# Patient Record
Sex: Male | Born: 1945 | Race: White | Hispanic: No | Marital: Single | Smoking: Never smoker
Health system: Southern US, Community
[De-identification: ages and names within clinical notes are randomized; demographics above are authoritative.]

## PROBLEM LIST (undated history)

## (undated) DIAGNOSIS — N400 Enlarged prostate without lower urinary tract symptoms: Secondary | ICD-10-CM

## (undated) DIAGNOSIS — R972 Elevated prostate specific antigen [PSA]: Secondary | ICD-10-CM

## (undated) DIAGNOSIS — N401 Enlarged prostate with lower urinary tract symptoms: Secondary | ICD-10-CM

## (undated) DIAGNOSIS — G40209 Localization-related (focal) (partial) symptomatic epilepsy and epileptic syndromes with complex partial seizures, not intractable, without status epilepticus: Secondary | ICD-10-CM

## (undated) DIAGNOSIS — G4733 Obstructive sleep apnea (adult) (pediatric): Secondary | ICD-10-CM

## (undated) DIAGNOSIS — R339 Retention of urine, unspecified: Secondary | ICD-10-CM

## (undated) DIAGNOSIS — M810 Age-related osteoporosis without current pathological fracture: Secondary | ICD-10-CM

## (undated) DIAGNOSIS — N200 Calculus of kidney: Secondary | ICD-10-CM

## (undated) DIAGNOSIS — G8191 Hemiplegia, unspecified affecting right dominant side: Secondary | ICD-10-CM

## (undated) DIAGNOSIS — G8190 Hemiplegia, unspecified affecting unspecified side: Secondary | ICD-10-CM

## (undated) DIAGNOSIS — R319 Hematuria, unspecified: Secondary | ICD-10-CM

## (undated) DIAGNOSIS — I6529 Occlusion and stenosis of unspecified carotid artery: Secondary | ICD-10-CM

## (undated) DIAGNOSIS — N138 Other obstructive and reflux uropathy: Secondary | ICD-10-CM

## (undated) DIAGNOSIS — G4730 Sleep apnea, unspecified: Secondary | ICD-10-CM

## (undated) DIAGNOSIS — C801 Malignant (primary) neoplasm, unspecified: Secondary | ICD-10-CM

## (undated) DIAGNOSIS — I10 Essential (primary) hypertension: Secondary | ICD-10-CM

## (undated) DIAGNOSIS — N1330 Unspecified hydronephrosis: Secondary | ICD-10-CM

## (undated) DIAGNOSIS — G809 Cerebral palsy, unspecified: Secondary | ICD-10-CM

## (undated) DIAGNOSIS — R351 Nocturia: Secondary | ICD-10-CM

## (undated) DIAGNOSIS — R569 Unspecified convulsions: Secondary | ICD-10-CM

## (undated) DIAGNOSIS — Z87898 Personal history of other specified conditions: Secondary | ICD-10-CM

## (undated) HISTORY — PX: APPENDECTOMY: SHX54

## (undated) HISTORY — DX: Cerebral palsy, unspecified: G80.9

## (undated) HISTORY — PX: TONSILLECTOMY: SUR1361

## (undated) HISTORY — DX: Hemiplegia, unspecified affecting right dominant side: G81.91

## (undated) HISTORY — DX: Hematuria, unspecified: R31.9

## (undated) HISTORY — DX: Sleep apnea, unspecified: G47.30

## (undated) HISTORY — DX: Benign prostatic hyperplasia without lower urinary tract symptoms: N40.0

## (undated) HISTORY — DX: Unspecified convulsions: R56.9

## (undated) HISTORY — DX: Unspecified hydronephrosis: N13.30

## (undated) HISTORY — PX: OTHER SURGICAL HISTORY: SHX169

## (undated) HISTORY — DX: Retention of urine, unspecified: R33.9

## (undated) HISTORY — DX: Age-related osteoporosis without current pathological fracture: M81.0

## (undated) HISTORY — DX: Nocturia: R35.1

## (undated) HISTORY — DX: Benign prostatic hyperplasia with lower urinary tract symptoms: N40.1

## (undated) HISTORY — DX: Essential (primary) hypertension: I10

## (undated) HISTORY — DX: Localization-related (focal) (partial) symptomatic epilepsy and epileptic syndromes with complex partial seizures, not intractable, without status epilepticus: G40.209

## (undated) HISTORY — DX: Obstructive sleep apnea (adult) (pediatric): G47.33

## (undated) HISTORY — DX: Occlusion and stenosis of unspecified carotid artery: I65.29

## (undated) HISTORY — DX: Elevated prostate specific antigen (PSA): R97.20

## (undated) HISTORY — DX: Other obstructive and reflux uropathy: N13.8

## (undated) HISTORY — DX: Personal history of other specified conditions: Z87.898

## (undated) HISTORY — PX: LEG SURGERY: SHX1003

## (undated) HISTORY — DX: Hemiplegia, unspecified affecting unspecified side: G81.90

## (undated) HISTORY — DX: Calculus of kidney: N20.0

---
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
## 2008-03-13 DIAGNOSIS — R0600 Dyspnea, unspecified: Secondary | ICD-10-CM | POA: Insufficient documentation

## 2008-04-02 ENCOUNTER — Ambulatory Visit: Payer: Self-pay | Admitting: Family Medicine

## 2008-08-06 ENCOUNTER — Ambulatory Visit: Payer: Self-pay

## 2009-06-17 ENCOUNTER — Emergency Department: Payer: Self-pay | Admitting: Emergency Medicine

## 2011-09-13 DIAGNOSIS — G8190 Hemiplegia, unspecified affecting unspecified side: Secondary | ICD-10-CM

## 2011-09-13 DIAGNOSIS — G40209 Localization-related (focal) (partial) symptomatic epilepsy and epileptic syndromes with complex partial seizures, not intractable, without status epilepticus: Secondary | ICD-10-CM

## 2011-09-13 DIAGNOSIS — G8191 Hemiplegia, unspecified affecting right dominant side: Secondary | ICD-10-CM | POA: Insufficient documentation

## 2011-09-13 HISTORY — DX: Localization-related (focal) (partial) symptomatic epilepsy and epileptic syndromes with complex partial seizures, not intractable, without status epilepticus: G40.209

## 2011-09-13 HISTORY — DX: Hemiplegia, unspecified affecting right dominant side: G81.91

## 2011-09-13 HISTORY — DX: Hemiplegia, unspecified affecting unspecified side: G81.90

## 2012-09-09 ENCOUNTER — Emergency Department: Payer: Self-pay | Admitting: Emergency Medicine

## 2012-09-09 LAB — CBC
HCT: 42.6 % (ref 40.0–52.0)
HGB: 15.2 g/dL (ref 13.0–18.0)
MCH: 35 pg — ABNORMAL HIGH (ref 26.0–34.0)
MCHC: 35.6 g/dL (ref 32.0–36.0)
MCV: 98 fL (ref 80–100)
Platelet: 370 10*3/uL (ref 150–440)
RBC: 4.34 10*6/uL — ABNORMAL LOW (ref 4.40–5.90)
RDW: 12.3 % (ref 11.5–14.5)
WBC: 13.4 10*3/uL — ABNORMAL HIGH (ref 3.8–10.6)

## 2012-09-09 LAB — URINALYSIS, COMPLETE
Bacteria: NONE SEEN
Bilirubin,UR: NEGATIVE
Glucose,UR: NEGATIVE mg/dL (ref 0–75)
Ketone: NEGATIVE
Nitrite: NEGATIVE
Ph: 5 (ref 4.5–8.0)
Protein: 30
RBC,UR: 594 /HPF (ref 0–5)
Specific Gravity: 1.028 (ref 1.003–1.030)
Squamous Epithelial: NONE SEEN
WBC UR: 3 /HPF (ref 0–5)

## 2012-09-09 LAB — BASIC METABOLIC PANEL
Anion Gap: 7 (ref 7–16)
BUN: 19 mg/dL — ABNORMAL HIGH (ref 7–18)
Calcium, Total: 10.2 mg/dL — ABNORMAL HIGH (ref 8.5–10.1)
Chloride: 99 mmol/L (ref 98–107)
Co2: 30 mmol/L (ref 21–32)
Creatinine: 1.15 mg/dL (ref 0.60–1.30)
Glucose: 124 mg/dL — ABNORMAL HIGH (ref 65–99)
Osmolality: 276 (ref 275–301)
Potassium: 3.6 mmol/L (ref 3.5–5.1)
Sodium: 136 mmol/L (ref 136–145)

## 2012-09-13 ENCOUNTER — Emergency Department: Payer: Self-pay | Admitting: Emergency Medicine

## 2012-09-13 LAB — URINALYSIS, COMPLETE
Bilirubin,UR: NEGATIVE
Glucose,UR: NEGATIVE mg/dL (ref 0–75)
Ketone: NEGATIVE
Nitrite: NEGATIVE
Ph: 5 (ref 4.5–8.0)
Protein: 30
RBC,UR: 121 /HPF (ref 0–5)
Specific Gravity: 1.014 (ref 1.003–1.030)
Squamous Epithelial: NONE SEEN
WBC UR: 6 /HPF (ref 0–5)

## 2012-09-15 ENCOUNTER — Emergency Department: Payer: Self-pay | Admitting: Emergency Medicine

## 2012-09-15 LAB — URINALYSIS, COMPLETE
Bacteria: NONE SEEN
Bilirubin,UR: NEGATIVE
Glucose,UR: NEGATIVE mg/dL (ref 0–75)
Nitrite: NEGATIVE
Ph: 6 (ref 4.5–8.0)
Protein: 30
RBC,UR: 742 /HPF (ref 0–5)
Specific Gravity: 1.02 (ref 1.003–1.030)
Squamous Epithelial: <1
WBC UR: 14 /HPF (ref 0–5)

## 2012-09-16 ENCOUNTER — Ambulatory Visit: Payer: Self-pay | Admitting: Urology

## 2012-09-16 ENCOUNTER — Emergency Department: Payer: Self-pay | Admitting: Emergency Medicine

## 2012-09-16 LAB — URINALYSIS, COMPLETE
Bilirubin,UR: NEGATIVE
Glucose,UR: NEGATIVE mg/dL (ref 0–75)
Ketone: NEGATIVE
Nitrite: NEGATIVE
Ph: 7 (ref 4.5–8.0)
Protein: NEGATIVE
RBC,UR: 5 /HPF (ref 0–5)
Specific Gravity: 1.005 (ref 1.003–1.030)
Squamous Epithelial: NONE SEEN
WBC UR: 3 /HPF (ref 0–5)

## 2012-10-01 ENCOUNTER — Ambulatory Visit: Payer: Self-pay

## 2012-10-12 ENCOUNTER — Emergency Department: Payer: Self-pay | Admitting: Emergency Medicine

## 2012-11-01 ENCOUNTER — Emergency Department: Payer: Self-pay | Admitting: Emergency Medicine

## 2013-03-18 ENCOUNTER — Ambulatory Visit: Payer: Self-pay | Admitting: Urology

## 2013-10-15 ENCOUNTER — Ambulatory Visit: Payer: Self-pay | Admitting: Urology

## 2014-04-15 ENCOUNTER — Ambulatory Visit: Payer: Self-pay | Admitting: Gastroenterology

## 2014-09-25 DIAGNOSIS — Z5181 Encounter for therapeutic drug level monitoring: Secondary | ICD-10-CM | POA: Insufficient documentation

## 2014-10-03 NOTE — Consult Note (Signed)
PATIENT NAME:  Dylan Sullivan, Dylan Sullivan MR#:  [id number] DATE OF BIRTH:  1946-05-18  DATE OF CONSULTATION:  09/16/2012  REFERRING PHYSICIAN:   CONSULTING PHYSICIAN:  Juliann Mule. Valma Cava II, MD  REASON FOR CONSULTATION:  Hematuria and urinary retention.   HISTORY OF PRESENT ILLNESS:  The patient is a 69 year old gentleman with a known history of BPH and kidney stones who presented with 5 recent visits to the ER according to them for hematuria and Foley catheter issues now presents with the Foley catheter coming all the way out and presenting with the Foley catheter balloon intact, but blown up and significant blood from the urethra consistent prostatic bleeding. He denies any fevers, chills, sweats. He does not remember taking it out. There are some baseline memory issues. It appears on conversations with the patient that he is somewhat of a poor historian; however ultimately, he did have a Foley catheter placed and it was removed with the balloon intact. He is unsure how this happened. He does describe some urgency and leakage from the catheter prior to this happening as would be expected. Upon discussion with him, he describes no significant GU history. He denies any baseline lower urinary tract symptoms. Denies any prostate issues. Denies any prostate surgery.   REVIEW OF SYSTEMS: No fevers, chills, sweats, nausea, vomiting, weight loss, weight gain or change in bowel or urinary habits other than outlined above. The balance of 12 systems is otherwise negative.   PAST MEDICAL HISTORY:   1.  Urinary retention.  2.  Kidney stones.   PAST SURGICAL HISTORY:  No GU surgical history.   OUTPATIENT MEDICATIONS:   1.  Ibuprofen.  2.  Percocet.  3.  Zofran.  4.  Aspirin.  5.  Dilantin.  6.  Hydrochlorothiazide.  7.  Keppra.  8.  Calcium citrate.  9.  Red yeast rice supplement.  10.  Coenzyme Q10 supplement.  11.  Vitamin B12.  12.  Flaxseed oil.   PHYSICAL EXAMINATION: VITAL SIGNS: He is afebrile, pulse  85, respiratory rate 18, blood pressure 122/76, satting 97% on room air.  GENERAL: He is alert and oriented x 3, conversational,  HEENT: Normocephalic and atraumatic.  NECK: Supple without lymphadenopathy.  CHEST: Nonlabored.  CARDIOVASCULAR: 2+ upper and lower extremity pulses x 4.  ABDOMEN: Soft, nontender. He does have mild suprapubic distention consistent with a full bladder. GENITOURINARY: Normal penis and bilateral descended testes with blood clots and penile urethral bleeding consistent with Foley catheter trauma.  RECTAL: Deferred.  EXTREMITIES: No cyanosis, clubbing, or edema.  MUSCULOSKELETAL: 5 out of 5 strength.  PSYCHIATRIC: Appropriate.  NEUROLOGIC: Grossly intact.   IMAGING:  Recent renal ultrasound and KUB from the last week were reviewed. Small renal stone is nonobstructing bilaterally. Ultrasound was consistent with a large prostate.   ASSESSMENT:  This is a 69 year old male with a known history of urinary retention and Foley catheter trauma. I was able to place a 16 [place] coude catheter with a moderate degree of difficulty. I placed 15 mL of sterile water in the balloon which drained initially amber urine output that did have some terminal hematuria too consistent with a prostatic bleed. Ultimately, the patient drained quite well without issue. He did have some bleeding around the catheter as expected.   ASSESSMENT:   1.  Discharge home with Foley catheter.  2.  Outpatient followup with local urologist for further evaluation.    ____________________________ Juliann Mule Nobie Putnam, MD erh:si D: 09/16/2012 14:19:00 ET T: 09/16/2012 17:08:14 ET JOB#:  [id number]  cc: Juliann Mule. Nobie Putnam, MD, <Dictator> Carroll Sage MD ELECTRONICALLY SIGNED 10/10/2012 11:51

## 2014-10-03 NOTE — Consult Note (Signed)
PATIENT NAME:  Dylan Sullivan, MANNIS MR#:  [id number] DATE OF BIRTH:  05-Oct-1945  DATE OF CONSULTATION:  09/16/2012  REFERRING PHYSICIAN:   CONSULTING PHYSICIAN:  Juliann Mule. Valma Cava II, MD  REASON FOR CONSULTATION:  Hematuria and urinary retention.   HISTORY OF PRESENT ILLNESS:  The patient is a 69 year old gentleman with a known history of BPH and kidney stones who presented with 5 recent visits to the ER according to them for hematuria and Foley catheter issues now presents with the Foley catheter coming all the way out and presenting with the Foley catheter balloon intact, but blown up and significant blood from the urethra consistent prostatic bleeding. He denies any fevers, chills, sweats. He does not remember taking it out. There are some baseline memory issues. It appears on conversations with the patient that he is somewhat of a poor historian; however ultimately, he did have a Foley catheter placed and it was removed with the balloon intact. He is unsure how this happened. He does describe some urgency and leakage from the catheter prior to this happening as would be expected. Upon discussion with him, he describes no significant GU history. He denies any baseline lower urinary tract symptoms. Denies any prostate issues. Denies any prostate surgery.   REVIEW OF SYSTEMS: No fevers, chills, sweats, nausea, vomiting, weight loss, weight gain or change in bowel or urinary habits other than outlined above. The balance of 12 systems is otherwise negative.   PAST MEDICAL HISTORY:   1.  Urinary retention.  2.  Kidney stones.   PAST SURGICAL HISTORY:  No GU surgical history.   OUTPATIENT MEDICATIONS:   1.  Ibuprofen.  2.  Percocet.  3.  Zofran.  4.  Aspirin.  5.  Dilantin.  6.  Hydrochlorothiazide.  7.  Keppra.  8.  Calcium citrate.  9.  Red yeast rice supplement.  10.  Coenzyme Q10 supplement.  11.  Vitamin B12.  12.  Flaxseed oil.   PHYSICAL EXAMINATION: VITAL SIGNS: He is afebrile, pulse  85, respiratory rate 18, blood pressure 122/76, satting 97% on room air.  GENERAL: He is alert and oriented x 3, conversational,  HEENT: Normocephalic and atraumatic.  NECK: Supple without lymphadenopathy.  CHEST: Nonlabored.  CARDIOVASCULAR: 2+ upper and lower extremity pulses x 4.  ABDOMEN: Soft, nontender. He does have mild suprapubic distention consistent with a full bladder. GENITOURINARY: Normal penis and bilateral descended testes with blood clots and penile urethral bleeding consistent with Foley catheter trauma.  RECTAL: Deferred.  EXTREMITIES: No cyanosis, clubbing, or edema.  MUSCULOSKELETAL: 5 out of 5 strength.  PSYCHIATRIC: Appropriate.  NEUROLOGIC: Grossly intact.   IMAGING:  Recent renal ultrasound and KUB from the last week were reviewed. Small renal stone is nonobstructing bilaterally. Ultrasound was consistent with a large prostate.   ASSESSMENT:  This is a 69 year old male with a known history of urinary retention and Foley catheter trauma. I was able to place a 16 [place] coude catheter with a moderate degree of difficulty. I placed 15 mL of sterile water in the balloon which drained initially amber urine output that did have some terminal hematuria too consistent with a prostatic bleed. Ultimately, the patient drained quite well <<MISSING TEXT>>. He did have some bleeding around the catheter as expected.   ASSESSMENT:   1.  Discharge home with Foley catheter.  2.  Outpatient followup with local urologist for further evaluation.    ____________________________ Juliann Mule Nobie Putnam, MD erh:si D: 09/16/2012 14:19:00 ET T: 09/16/2012 17:08:14 ET JOB#:  [id number]  cc: Juliann Mule. Nobie Putnam, MD, <Dictator>

## 2014-10-03 NOTE — Consult Note (Signed)
PATIENT NAME:  Dylan Sullivan, Dylan Sullivan MR#:  [id number] DATE OF BIRTH:  05/01/1946  DATE OF CONSULTATION:  09/16/2012  REFERRING PHYSICIAN:   CONSULTING PHYSICIAN:  Dylan Mule. Valma Cava II, MD  REASON FOR CONSULTATION:  Hematuria and urinary retention.   HISTORY OF PRESENT ILLNESS:  The patient is a 69 year old gentleman with a known history of BPH and kidney stones who presented with 5 recent visits to the ER according to them for hematuria and Foley catheter issues now presents with the Foley catheter coming all the way out and presenting with the Foley catheter balloon intact, but blown up and significant blood from the urethra consistent prostatic bleeding. He denies any fevers, chills, sweats. He does not remember taking it out. There are some baseline memory issues. It appears on conversations with the patient that he is somewhat of a poor historian; however ultimately, he did have a Foley catheter placed and it was removed with the balloon intact. He is unsure how this happened. He does describe some urgency and leakage from the catheter prior to this happening as would be expected. Upon discussion with him, he describes no significant GU history. He denies any baseline lower urinary tract symptoms. Denies any prostate issues. Denies any prostate surgery.   REVIEW OF SYSTEMS: No fevers, chills, sweats, nausea, vomiting, weight loss, weight gain or change in bowel or urinary habits other than outlined above. The balance of 12 systems is otherwise negative.   PAST MEDICAL HISTORY:   1.  Urinary retention.  2.  Kidney stones.   PAST SURGICAL HISTORY:  No GU surgical history.   OUTPATIENT MEDICATIONS:   1.  Ibuprofen.  2.  Percocet.  3.  Zofran.  4.  Aspirin.  5.  Dilantin.  6.  Hydrochlorothiazide.  7.  Keppra.  8.  Calcium citrate.  9.  (Dictation Anomaly)<<MISSING TEXT>>supplement.  10.  Coenzyme Q10 supplement.  11.  Vitamin B12.  12.  Flaxseed oil.   PHYSICAL EXAMINATION: VITAL SIGNS: He  is afebrile, pulse 85, respiratory rate 18, blood pressure 122/76, satting 97% on room air.  GENERAL: He is alert and oriented x 3, conversational,  HEENT: Normocephalic and atraumatic.  NECK: Supple without lymphadenopathy.  CHEST: Nonlabored.  CARDIOVASCULAR: 2+ upper and lower extremity pulses x 4.  ABDOMEN: Soft, nontender. He does have mild suprapubic distention consistent with a full bladder. GENITOURINARY: Normal penis and bilateral descended testes with blood clots and penile urethral bleeding consistent with Foley catheter trauma.  RECTAL: Deferred.  EXTREMITIES: No cyanosis, clubbing, or edema.  MUSCULOSKELETAL: 5 out of 5 strength.  PSYCHIATRIC: Appropriate.  NEUROLOGIC: Grossly intact.   IMAGING:  Recent renal ultrasound and KUB from the last week were reviewed. Small renal stone is nonobstructing bilaterally. Ultrasound was consistent with a large prostate.   ASSESSMENT:  This is a 69 year old male with a known history of urinary retention and Foley catheter trauma. I was able to place a 16 [place] coude catheter with a moderate degree of difficulty. I placed 15 mL of sterile water in the balloon which drained initially amber urine output that did have some terminal hematuria too consistent with a prostatic bleed. Ultimately, the patient drained quite well (Dictation Anomaly)<<MISSING TEXT>>. He did have some bleeding around the catheter as expected.   ASSESSMENT:   1.  Discharge home with Foley catheter.  2.  Outpatient followup with local urologist for further evaluation.    ____________________________ Dylan Mule Nobie Putnam, MD erh:si D: 09/16/2012 14:19:00 ET T: 09/16/2012 17:08:14 ET JOB#:  [id number]  cc: Dylan Mule. Nobie Putnam, MD, <Dictator>

## 2014-10-06 LAB — SURGICAL PATHOLOGY

## 2014-12-29 DIAGNOSIS — G4733 Obstructive sleep apnea (adult) (pediatric): Secondary | ICD-10-CM

## 2014-12-29 HISTORY — DX: Obstructive sleep apnea (adult) (pediatric): G47.33

## 2015-03-10 ENCOUNTER — Other Ambulatory Visit: Payer: Self-pay | Admitting: Urology

## 2015-04-10 ENCOUNTER — Encounter: Payer: Self-pay | Admitting: *Deleted

## 2015-04-10 ENCOUNTER — Other Ambulatory Visit: Payer: Self-pay | Admitting: *Deleted

## 2015-04-16 DIAGNOSIS — I10 Essential (primary) hypertension: Secondary | ICD-10-CM

## 2015-04-16 HISTORY — DX: Essential (primary) hypertension: I10

## 2015-04-20 ENCOUNTER — Encounter: Payer: Self-pay | Admitting: Urology

## 2015-04-20 ENCOUNTER — Ambulatory Visit (INDEPENDENT_AMBULATORY_CARE_PROVIDER_SITE_OTHER): Payer: Medicare Other | Admitting: Urology

## 2015-04-20 VITALS — BP 105/66 | HR 91 | Ht 69.0 in | Wt 162.3 lb

## 2015-04-20 DIAGNOSIS — N401 Enlarged prostate with lower urinary tract symptoms: Secondary | ICD-10-CM | POA: Diagnosis not present

## 2015-04-20 DIAGNOSIS — Z87898 Personal history of other specified conditions: Secondary | ICD-10-CM | POA: Diagnosis not present

## 2015-04-20 DIAGNOSIS — N138 Other obstructive and reflux uropathy: Secondary | ICD-10-CM

## 2015-04-20 DIAGNOSIS — N400 Enlarged prostate without lower urinary tract symptoms: Secondary | ICD-10-CM | POA: Insufficient documentation

## 2015-04-20 HISTORY — DX: Personal history of other specified conditions: Z87.898

## 2015-04-20 HISTORY — DX: Other obstructive and reflux uropathy: N40.1

## 2015-04-20 HISTORY — DX: Benign prostatic hyperplasia with lower urinary tract symptoms: N13.8

## 2015-04-20 MED ORDER — FINASTERIDE 5 MG PO TABS: 5.0000 mg | ORAL_TABLET | Freq: Every day | ORAL | Status: DC

## 2015-04-20 MED ORDER — TAMSULOSIN HCL 0.4 MG PO CAPS: 0.4000 mg | ORAL_CAPSULE | Freq: Every day | ORAL | Status: DC

## 2015-04-20 NOTE — Progress Notes (Signed)
04/20/2015 12:04 PM   Dylan Sullivan 1945/08/10 [id number]  Referring provider: No referring provider defined for this encounter.  Chief Complaint  Patient presents with  . Benign Prostatic Hypertrophy    nocturia    HPI: Patient is a 69 year old white male with BPH and LUTS and a history of elevated PSA who presents today for 6 month follow-up.   BPH WITH LUTS His IPSS score today is 5, which is mild lower urinary tract symptomatology. He is delighted with his quality life due to his urinary symptoms.  He denies any dysuria, hematuria or suprapubic pain.  He currently taking tamsulosin 0.4 mg daily and finasteride 5 mg daily.  He also denies any recent fevers, chills, nausea or vomiting.   He does not have a family history of PCa.      IPSS      04/20/15 1100       International Prostate Symptom Score   How often have you had the sensation of not emptying your bladder? Not at All     How often have you had to urinate less than every two hours? Less than 1 in 5 times     How often have you found you stopped and started again several times when you urinated? Less than 1 in 5 times     How often have you found it difficult to postpone urination? Not at All     How often have you had a weak urinary stream? Less than 1 in 5 times     How often have you had to strain to start urination? Not at All     How many times did you typically get up at night to urinate? 2 Times     Total IPSS Score 5     Quality of Life due to urinary symptoms   If you were to spend the rest of your life with your urinary condition just the way it is now how would you feel about that? Delighted        Score:  1-7 Mild 8-19 Moderate 20-35 Severe   History of elevated PSA Patient's PSA reached 9.8 ng/mL in November 2015. This was in association with a urinary tract infection. When the urinary tract infection was treated in the PSA repeated, it returned to baseline.  PMH: Past Medical History    Diagnosis Date  . Sleep apnea   . Osteoporosis   . Seizures ([place])   . BPH (benign prostatic hyperplasia)   . Hydronephrosis   . Hematuria   . Kidney stone   . Urinary retention   . Nocturia   . Elevated PSA   . Cerebral palsy ([place])   . Obstructive apnea 12/29/2014  . Hemiplegia ([place]) 09/13/2011    Overview:  Upper extremity most affected   . 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 Tonsillectomy    . Arm surgery Right     child  . Leg surgery Right     child  . Appendectomy    . Brain surgery      Home Medications:    Medication List       This list is accurate as of: 04/20/15 12:04 PM.  Always use your most recent med list.               aspirin EC 81 MG tablet  Take by mouth.  BIOGAIA PROBIOTIC Liqd  Take by mouth daily at 8 pm.     Coenzyme Q-10 200 MG Caps  Take by mouth.     DILANTIN 100 MG ER capsule  Generic drug:  phenytoin  TAKE 3 CAPSULES (300 MG TOTAL) BY MOUTH ONCE DAILY.     finasteride 5 MG tablet  Commonly known as:  PROSCAR  Take 1 tablet (5 mg total) by mouth daily.     Flaxseed Misc  Use 15 mLs daily.     hydrochlorothiazide 25 MG tablet  Commonly known as:  HYDRODIURIL  TAKE 1 TABLET BY MOUTH EVERY DAY FOR FLUID     levETIRAcetam 1000 MG tablet  Commonly known as:  KEPPRA  Take by mouth.     ONE-A-DAY BONE STRENGTH PO  Take by mouth.     tamsulosin 0.4 MG Caps capsule  Commonly known as:  FLOMAX  Take 1 capsule (0.4 mg total) by mouth daily.        Allergies: No Known Allergies  Family History: Family History  Problem Relation Age of Onset  . Breast cancer Maternal Aunt   . Prostate cancer Neg Hx   . Bladder Cancer Neg Hx   . Kidney cancer Neg Hx     Social History:  reports that he has never smoked. He does not have any smokeless tobacco history on file. He reports that he  does not drink alcohol. His drug history is not on file.  ROS: UROLOGY Frequent Urination?: No Hard to postpone urination?: No Burning/pain with urination?: No Get up at night to urinate?: Yes Leakage of urine?: No Urine stream starts and stops?: No Trouble starting stream?: No Do you have to strain to urinate?: No Blood in urine?: No Urinary tract infection?: No Sexually transmitted disease?: No Injury to kidneys or bladder?: No Painful intercourse?: No Weak stream?: No Erection problems?: No Penile pain?: No  Gastrointestinal Nausea?: No Vomiting?: No Indigestion/heartburn?: No Diarrhea?: No Constipation?: No  Constitutional Fever: No Night sweats?: No Weight loss?: No Fatigue?: No  Skin Skin rash/lesions?: No Itching?: No  Eyes Blurred vision?: No Double vision?: No  Ears/Nose/Throat Sore throat?: No Sinus problems?: No  Hematologic/Lymphatic Swollen glands?: No Easy bruising?: No  Cardiovascular Leg swelling?: No Chest pain?: No  Respiratory Cough?: No Shortness of breath?: No  Endocrine Excessive thirst?: No  Musculoskeletal Back pain?: No Joint pain?: No  Neurological Headaches?: No Dizziness?: No  Psychologic Depression?: No Anxiety?: No  Physical Exam: BP 105/66 mmHg  Pulse 91  Ht 5\' 9"  (1.753 m)  Wt 162 lb 4.8 oz (73.619 kg)  BMI 23.96 kg/m2  GU: No CVA tenderness.  No bladder fullness or masses.  Patient with circumcised phallus.   Urethral meatus is patent.  No penile discharge. No penile lesions or rashes. Scrotum without lesions, cysts, rashes and/or edema.  Testicles are located scrotally bilaterally. No masses are appreciated in the testicles. Left and right epididymis are normal. Rectal: Patient with  normal sphincter tone. Anus and perineum without scarring or rashes. No rectal masses are appreciated. Prostate is approximately 45 grams, no nodules are appreciated. Seminal vesicles are normal.  Laboratory Data: Lab  Results  Component Value Date   WBC 13.4* 09/09/2012   HGB 15.2 09/09/2012   HCT 42.6 09/09/2012   MCV 98 09/09/2012   PLT 370 09/09/2012    Lab Results  Component Value Date   CREATININE 1.15 09/09/2012    PSA History:  3.8 ng/mL on 03/18/2013  2.8 ng/mL on  10/04/2013  3.9 ng/mL on 04/23/2014  2.8 ng/mL on 10/16/2014   Assessment & Plan:    1. BPH (benign prostatic hyperplasia) with LUTS:   IPSS score is 5/0.  Patient will continue the tamsulosin and finasteride. A refill for both of these medications is sent to his pharmacy. He will return in 6 months for I PSS score, exam and PSA.  - PSA  2. History of elevated PSA:   Patient's PSA reached 9.8 ng/mL in November 2015. This was in association with a urinary tract infection. When the urinary tract infection was treated in the PSA repeated, it returned to baseline.  We will continue to monitor. Patient will return in 6 months for PSA and exam.  Return in about 6 months (around 10/18/2015) for IPSS and exam.  Zara Council, [facility]  [facility] [address], [place], [place] [address] [phone]

## 2015-04-21 LAB — PSA: Prostate Specific Ag, Serum: 2.9 ng/mL (ref 0.0–4.0)

## 2015-05-12 ENCOUNTER — Telehealth: Payer: Self-pay | Admitting: Urology

## 2015-05-12 DIAGNOSIS — N400 Enlarged prostate without lower urinary tract symptoms: Secondary | ICD-10-CM

## 2015-05-12 MED ORDER — TAMSULOSIN HCL 0.4 MG PO CAPS: 0.4000 mg | ORAL_CAPSULE | Freq: Every day | ORAL | Status: DC

## 2015-05-12 MED ORDER — FINASTERIDE 5 MG PO TABS: 5.0000 mg | ORAL_TABLET | Freq: Every day | ORAL | Status: DC

## 2015-05-12 NOTE — Telephone Encounter (Signed)
Pt came in and brought new prescriptions in to let us know he changed pharmacies.  He is now with [facility] [phone]  His current rx are:  Finasteride 5 mg tablet Tamsulosin HCL 0.4 mg capsule

## 2015-05-12 NOTE — Telephone Encounter (Signed)
Refills sent

## 2015-10-12 ENCOUNTER — Other Ambulatory Visit: Payer: Self-pay

## 2015-10-12 DIAGNOSIS — N400 Enlarged prostate without lower urinary tract symptoms: Secondary | ICD-10-CM

## 2015-10-13 ENCOUNTER — Other Ambulatory Visit: Payer: Medicare Other

## 2015-10-13 DIAGNOSIS — N400 Enlarged prostate without lower urinary tract symptoms: Secondary | ICD-10-CM

## 2015-10-14 LAB — PSA: Prostate Specific Ag, Serum: 2.4 ng/mL (ref 0.0–4.0)

## 2015-10-20 ENCOUNTER — Encounter: Payer: Self-pay | Admitting: Urology

## 2015-10-20 ENCOUNTER — Ambulatory Visit (INDEPENDENT_AMBULATORY_CARE_PROVIDER_SITE_OTHER): Payer: Medicare Other | Admitting: Urology

## 2015-10-20 VITALS — BP 117/66 | HR 82 | Ht 72.0 in | Wt 165.9 lb

## 2015-10-20 DIAGNOSIS — N138 Other obstructive and reflux uropathy: Secondary | ICD-10-CM

## 2015-10-20 DIAGNOSIS — Z87898 Personal history of other specified conditions: Secondary | ICD-10-CM | POA: Diagnosis not present

## 2015-10-20 DIAGNOSIS — N401 Enlarged prostate with lower urinary tract symptoms: Secondary | ICD-10-CM | POA: Diagnosis not present

## 2015-10-20 NOTE — Progress Notes (Signed)
11:37 AM   Dylan Sullivan July 03, 1945 [id number]  Referring provider: Casilda Carls, MD [address]   [place], [place] [address]  Chief Complaint  Patient presents with  . Benign Prostatic Hypertrophy    6 month follow up    HPI: Patient is a 70 year old Caucasian male with BPH and LUTS and a history of elevated PSA who presents today for 6 month follow-up.   BPH WITH LUTS His IPSS score today is 8, which is moderate lower urinary tract symptomatology. He is pleased with his quality life due to his urinary symptoms.  His previous I PSS score was 5/0.   His symptoms are slightly worse than 6 months ago. He denies any dysuria, hematuria or suprapubic pain.  He currently taking tamsulosin 0.4 mg daily and finasteride 5 mg daily.  He also denies any recent fevers, chills, nausea or vomiting.   He does not have a family history of PCa.      IPSS      10/20/15 1100       International Prostate Symptom Score   How often have you had the sensation of not emptying your bladder? About half the time     How often have you had to urinate less than every two hours? Less than half the time     How often have you found you stopped and started again several times when you urinated? Less than 1 in 5 times     How often have you found it difficult to postpone urination? Not at All     How often have you had a weak urinary stream? Not at All     How often have you had to strain to start urination? Not at All     How many times did you typically get up at night to urinate? 2 Times     Total IPSS Score 8     Quality of Life due to urinary symptoms   If you were to spend the rest of your life with your urinary condition just the way it is now how would you feel about that? Pleased        Score:  1-7 Mild 8-19 Moderate 20-35 Severe   History of elevated PSA Patient's PSA reached 9.8 ng/mL in November 2015. This was in association with a urinary tract infection. When the urinary tract  infection was treated in the PSA repeated, it returned to baseline.  His most recent PSA was 2.4 ng/mL on 10/13/2015.    PMH: Past Medical History  Diagnosis Date  . Sleep apnea   . Osteoporosis   . Seizures ([place])   . BPH (benign prostatic hyperplasia)   . Hydronephrosis   . Hematuria   . Kidney stone   . Urinary retention   . Nocturia   . Elevated PSA   . Cerebral palsy ([place])   . Obstructive apnea 12/29/2014  . Hemiplegia ([place]) 09/13/2011    Overview:  Upper extremity most affected   . 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 Tonsillectomy    . Arm surgery Right     child  . Leg surgery Right     child  . Appendectomy    . Brain surgery      Home Medications:    Medication List       This list is accurate as of: 10/20/15  11:37 AM.  Always use your most recent med list.               aspirin EC 81 MG tablet  Take by mouth.     BIOGAIA PROBIOTIC Liqd  Take by mouth daily at 8 pm.     Coenzyme Q-10 200 MG Caps  Take by mouth.     DILANTIN 100 MG ER capsule  Generic drug:  phenytoin  TAKE 3 CAPSULES (300 MG TOTAL) BY MOUTH ONCE DAILY.     finasteride 5 MG tablet  Commonly known as:  PROSCAR  Take 1 tablet (5 mg total) by mouth daily.     Flaxseed Misc  Reported on 10/20/2015     Flaxseed Oil Oil     hydrochlorothiazide 25 MG tablet  Commonly known as:  HYDRODIURIL  TAKE 1 TABLET BY MOUTH EVERY DAY FOR FLUID     levETIRAcetam 1000 MG tablet  Commonly known as:  KEPPRA  Take by mouth.     levETIRAcetam 1000 MG tablet  Commonly known as:  KEPPRA     ONE-A-DAY BONE STRENGTH PO  Take by mouth.     OVER THE COUNTER MEDICATION  Tumeric 500     tamsulosin 0.4 MG Caps capsule  Commonly known as:  FLOMAX  Take 1 capsule (0.4 mg total) by mouth daily.     V-R MAGNESIUM CITRATE Soln  Generic drug:  magnesium citrate    Take by mouth. Reported on 10/20/2015     VITAMIN D-3 PO  Take by mouth.        Allergies: No Known Allergies  Family History: Family History  Problem Relation Age of Onset  . Breast cancer Maternal Aunt   . Prostate cancer Neg Hx   . Bladder Cancer Neg Hx   . Kidney cancer Neg Hx     Social History:  reports that he has never smoked. He does not have any smokeless tobacco history on file. He reports that he does not drink alcohol or use illicit drugs.  ROS: UROLOGY Frequent Urination?: No Hard to postpone urination?: No Burning/pain with urination?: No Get up at night to urinate?: No Leakage of urine?: No Urine stream starts and stops?: No Trouble starting stream?: No Do you have to strain to urinate?: No Blood in urine?: No Urinary tract infection?: No Sexually transmitted disease?: No Injury to kidneys or bladder?: No Painful intercourse?: No Weak stream?: No Erection problems?: No Penile pain?: No  Gastrointestinal Nausea?: No Vomiting?: No Indigestion/heartburn?: No Diarrhea?: No Constipation?: No  Constitutional Fever: No Night sweats?: No Weight loss?: No Fatigue?: No  Skin Skin rash/lesions?: No Itching?: No  Eyes Blurred vision?: No Double vision?: No  Ears/Nose/Throat Sore throat?: No Sinus problems?: No  Hematologic/Lymphatic Swollen glands?: No Easy bruising?: No  Cardiovascular Leg swelling?: No Chest pain?: No  Respiratory Cough?: No Shortness of breath?: No  Endocrine Excessive thirst?: No  Musculoskeletal Back pain?: No Joint pain?: No  Neurological Headaches?: No Dizziness?: No  Psychologic Depression?: No Anxiety?: No  Physical Exam: BP 117/66 mmHg  Pulse 82  Ht 6' (1.829 m)  Wt 165 lb 14.4 oz (75.252 kg)  BMI 22.50 kg/m2  GU: No CVA tenderness.  No bladder fullness or masses.  Patient with circumcised phallus.   Urethral meatus is patent.  No penile discharge. No penile lesions or rashes. Scrotum  without lesions, cysts, rashes and/or edema.  Testicles are located scrotally bilaterally. No masses are appreciated in the testicles. Left and right  epididymis are normal. Rectal: Patient with  normal sphincter tone. Anus and perineum without scarring or rashes. No rectal masses are appreciated. Prostate is approximately 45 grams, no nodules are appreciated. Seminal vesicles are normal.  Laboratory Data: Lab Results  Component Value Date   WBC 13.4* 09/09/2012   HGB 15.2 09/09/2012   HCT 42.6 09/09/2012   MCV 98 09/09/2012   PLT 370 09/09/2012    Lab Results  Component Value Date   CREATININE 1.15 09/09/2012    PSA History:  3.8 ng/mL on 03/18/2013  2.8 ng/mL on 10/04/2013  3.9 ng/mL on 04/23/2014  2.8 ng/mL on 10/16/2014  2.9 ng/mL on 04/20/2015  2.4 ng/mL on 10/13/2015    Assessment & Plan:    1. BPH (benign prostatic hyperplasia) with LUTS:   IPSS score is 8/1.  Patient will continue the tamsulosin and finasteride. A refill for both of these medications is sent to his pharmacy. He will return in 6 months for I PSS score, exam and PSA.  2. History of elevated PSA:   Patient's PSA reached 9.8 ng/mL in November 2015. This was in association with a urinary tract infection. When the urinary tract infection was treated in the PSA repeated, it returned to baseline.  His most recent PSA was 2.4 ng/mL on 10/13/2015.  We will continue to monitor. Patient will return in 6 months for PSA and exam.  Return in about 6 months (around 04/21/2016) for IPSS and exam.  Zara Council, [facility]  [facility] [address] [place], [place] [address] [phone]

## 2016-01-25 DIAGNOSIS — I351 Nonrheumatic aortic (valve) insufficiency: Secondary | ICD-10-CM | POA: Insufficient documentation

## 2016-02-19 ENCOUNTER — Other Ambulatory Visit: Payer: Self-pay | Admitting: Urology

## 2016-02-19 DIAGNOSIS — N400 Enlarged prostate without lower urinary tract symptoms: Secondary | ICD-10-CM

## 2016-03-25 ENCOUNTER — Other Ambulatory Visit: Payer: Self-pay | Admitting: Urology

## 2016-03-25 DIAGNOSIS — N400 Enlarged prostate without lower urinary tract symptoms: Secondary | ICD-10-CM

## 2016-03-25 NOTE — Telephone Encounter (Signed)
Pharmacy sent a refill of finasteride. 30 days and no refills given. Pt has an appt on 04/21/16.

## 2016-04-11 ENCOUNTER — Other Ambulatory Visit: Payer: Self-pay

## 2016-04-11 DIAGNOSIS — N401 Enlarged prostate with lower urinary tract symptoms: Secondary | ICD-10-CM

## 2016-04-14 ENCOUNTER — Other Ambulatory Visit: Payer: Medicare Other

## 2016-04-14 DIAGNOSIS — N401 Enlarged prostate with lower urinary tract symptoms: Secondary | ICD-10-CM

## 2016-04-15 ENCOUNTER — Telehealth: Payer: Self-pay

## 2016-04-15 LAB — PSA: Prostate Specific Ag, Serum: 3.2 ng/mL (ref 0.0–4.0)

## 2016-04-21 ENCOUNTER — Encounter: Payer: Self-pay | Admitting: Urology

## 2016-04-21 ENCOUNTER — Ambulatory Visit (INDEPENDENT_AMBULATORY_CARE_PROVIDER_SITE_OTHER): Payer: Medicare Other | Admitting: Urology

## 2016-04-21 VITALS — BP 119/71 | HR 84 | Ht 72.0 in | Wt 166.8 lb

## 2016-04-21 DIAGNOSIS — Z87898 Personal history of other specified conditions: Secondary | ICD-10-CM

## 2016-04-21 DIAGNOSIS — N138 Other obstructive and reflux uropathy: Secondary | ICD-10-CM | POA: Diagnosis not present

## 2016-04-21 DIAGNOSIS — N401 Enlarged prostate with lower urinary tract symptoms: Secondary | ICD-10-CM | POA: Diagnosis not present

## 2016-04-21 NOTE — Progress Notes (Signed)
11:39 AM   Dylan Sullivan 08-Oct-1945 [id number]  Referring provider: Casilda Carls [address] [place],  [address]  Chief Complaint  Patient presents with  . Benign Prostatic Hypertrophy    6 month follow up  . Elevated PSA    HPI: Patient is a 70 year old Caucasian male with BPH and LUTS and a history of elevated PSA who presents today for 6 month follow-up.   BPH WITH LUTS His IPSS score today is 4, which is mild lower urinary tract symptomatology. He is mostly satisfied with his quality life due to his urinary symptoms.  His previous I PSS score was 8/2.   He complains of nocturia x  2.  He denies any dysuria, hematuria or suprapubic pain.  He currently taking tamsulosin 0.4 mg daily and finasteride 5 mg daily.  He also denies any recent fevers, chills, nausea or vomiting.   He does not have a family history of PCa.      IPSS    Row Name 04/21/16 1100         International Prostate Symptom Score   How often have you had the sensation of not emptying your bladder? Not at All     How often have you had to urinate less than every two hours? Less than 1 in 5 times     How often have you found you stopped and started again several times when you urinated? Not at All     How often have you found it difficult to postpone urination? Not at All     How often have you had a weak urinary stream? Less than 1 in 5 times     How often have you had to strain to start urination? Not at All     How many times did you typically get up at night to urinate? 2 Times     Total IPSS Score 4       Quality of Life due to urinary symptoms   If you were to spend the rest of your life with your urinary condition just the way it is now how would you feel about that? Mostly Satisfied        Score:  1-7 Mild 8-19 Moderate 20-35 Severe   History of elevated PSA Patient's PSA reached 9.8 ng/mL in November 2015. This was in association with a urinary tract infection. When the urinary  tract infection was treated in the PSA repeated, it returned to baseline.  His most recent PSA was 3.2 ng/mL on 04/14/2016.  PMH: Past Medical History:  Diagnosis Date  . BPH (benign prostatic hyperplasia)   . Cerebral palsy ([place])   . Elevated PSA   . Hematuria   . Hemiplegia ([place]) 09/13/2011   Overview:  Upper extremity most affected   . Hydronephrosis   . Kidney stone   . Nocturia   . Obstructive apnea 12/29/2014  . Osteoporosis   . 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 Seizures ([place])   . Sleep apnea   . Urinary retention     Surgical History: Past Surgical History:  Procedure Laterality Date  . APPENDECTOMY    . arm surgery Right    child  . BRAIN SURGERY    . LEG SURGERY Right    child  . TONSILLECTOMY      Home Medications:    Medication List       Accurate as of 04/21/16 11:39 AM.  Always use your most recent med list.          aspirin EC 81 MG tablet Take by mouth.   BIOGAIA PROBIOTIC Liqd Take by mouth daily at 8 pm.   Coenzyme Q-10 200 MG Caps Take by mouth.   DILANTIN 100 MG ER capsule Generic drug:  phenytoin TAKE 3 CAPSULES (300 MG TOTAL) BY MOUTH ONCE DAILY.   finasteride 5 MG tablet Commonly known as:  PROSCAR Take 5 mg by mouth daily.   Flaxseed Misc Reported on 10/20/2015   Flaxseed Oil Oil   hydrochlorothiazide 25 MG tablet Commonly known as:  HYDRODIURIL TAKE 1 TABLET BY MOUTH EVERY DAY FOR FLUID   levETIRAcetam 1000 MG tablet Commonly known as:  KEPPRA Take by mouth.   levETIRAcetam 1000 MG tablet Commonly known as:  KEPPRA   ondansetron 4 MG tablet Commonly known as:  ZOFRAN Take by mouth.   ONE-A-DAY BONE STRENGTH PO Take by mouth.   OVER THE COUNTER MEDICATION Tumeric 500   RESVERATROL PO Take by mouth.   tamsulosin 0.4 MG Caps capsule Commonly known as:  FLOMAX TAKE ONE CAPSULE BY MOUTH EVERY DAY   V-R MAGNESIUM CITRATE Soln Generic drug:   magnesium citrate Take by mouth. Reported on 10/20/2015   VITAMIN D-3 PO Take by mouth.       Allergies: No Known Allergies  Family History: Family History  Problem Relation Age of Onset  . Breast cancer Maternal Aunt   . Prostate cancer Neg Hx   . Bladder Cancer Neg Hx   . Kidney cancer Neg Hx     Social History:  reports that he has never smoked. He has never used smokeless tobacco. He reports that he does not drink alcohol or use drugs.  ROS: UROLOGY Frequent Urination?: No Hard to postpone urination?: No Burning/pain with urination?: No Get up at night to urinate?: No Leakage of urine?: No Urine stream starts and stops?: No Trouble starting stream?: No Do you have to strain to urinate?: No Blood in urine?: No Urinary tract infection?: No Sexually transmitted disease?: No Injury to kidneys or bladder?: No Painful intercourse?: No Weak stream?: No Erection problems?: No Penile pain?: No  Gastrointestinal Nausea?: No Vomiting?: No Indigestion/heartburn?: No Diarrhea?: No Constipation?: No  Constitutional Fever: No Night sweats?: No Weight loss?: No Fatigue?: No  Skin Skin rash/lesions?: No Itching?: No  Eyes Blurred vision?: No Double vision?: No  Ears/Nose/Throat Sore throat?: No Sinus problems?: No  Hematologic/Lymphatic Swollen glands?: No Easy bruising?: No  Cardiovascular Leg swelling?: No Chest pain?: No  Respiratory Cough?: No Shortness of breath?: No  Endocrine Excessive thirst?: No  Musculoskeletal Back pain?: No Joint pain?: No  Neurological Headaches?: No Dizziness?: No  Psychologic Depression?: No Anxiety?: No  Physical Exam: BP 119/71   Pulse 84   Ht 6' (1.829 m)   Wt 166 lb 12.8 oz (75.7 kg)   BMI 22.62 kg/m   GU: No CVA tenderness.  No bladder fullness or masses.  Patient with circumcised phallus.   Urethral meatus is patent.  No penile discharge. No penile lesions or rashes. Scrotum without lesions,  cysts, rashes and/or edema.  Testicles are located scrotally bilaterally. No masses are appreciated in the testicles. Left and right epididymis are normal. Rectal: Patient with  normal sphincter tone. Anus and perineum without scarring or rashes. No rectal masses are appreciated. Prostate is approximately 45 grams, no nodules are appreciated. Seminal vesicles are normal.  Laboratory Data: Lab Results  Component Value  Date   WBC 13.4 (H) 09/09/2012   HGB 15.2 09/09/2012   HCT 42.6 09/09/2012   MCV 98 09/09/2012   PLT 370 09/09/2012    Lab Results  Component Value Date   CREATININE 1.15 09/09/2012    PSA History:  3.8 ng/mL on 03/18/2013  2.8 ng/mL on 10/04/2013  3.9 ng/mL on 04/23/2014  2.8 ng/mL on 10/16/2014  2.9 ng/mL on 04/20/2015  2.4 ng/mL on 10/13/2015  3.2 ng/mL on 04/14/2016    Assessment & Plan:    1. BPH with LUTS  - IPSS score is 4/2, it is improving  - Continue conservative management, avoiding bladder irritants and timed voiding's  - Continue tamsulosin 0.4 mg daily and finasteride 5 mg daily  - RTC in 6 months for IPSS, PSA and exam   2. History of elevated PSA:   Patient's PSA reached 9.8 ng/mL in November 2015. This was in association with a urinary tract infection. When the urinary tract infection was treated in the PSA repeated, it returned to baseline.  His most recent PSA was 3.2 ng/mL on 04/14/2016.  We will continue to monitor. Patient will return in 6 months for PSA and exam.  Return in about 6 months (around 10/19/2016) for IPSS, PSA and exam.  Zara Council, [facility]  [facility] [address], [place], [place] [address] [phone]

## 2016-04-21 NOTE — Telephone Encounter (Signed)
Open in error

## 2016-04-29 ENCOUNTER — Other Ambulatory Visit: Payer: Self-pay

## 2016-04-29 ENCOUNTER — Other Ambulatory Visit: Payer: Self-pay | Admitting: Urology

## 2016-04-29 DIAGNOSIS — N401 Enlarged prostate with lower urinary tract symptoms: Secondary | ICD-10-CM

## 2016-04-29 MED ORDER — FINASTERIDE 5 MG PO TABS: 5.0000 mg | ORAL_TABLET | Freq: Every day | ORAL | 12 refills | Status: DC

## 2016-06-16 ENCOUNTER — Observation Stay: Payer: Medicare Other

## 2016-06-16 ENCOUNTER — Encounter: Payer: Self-pay | Admitting: *Deleted

## 2016-06-16 ENCOUNTER — Emergency Department: Payer: Medicare Other

## 2016-06-16 ENCOUNTER — Observation Stay
Admission: EM | Admit: 2016-06-16 | Discharge: 2016-06-18 | Disposition: A | Discharge status: Home / Self Care | Payer: Medicare Other | Attending: Internal Medicine | Admitting: Internal Medicine

## 2016-06-16 DIAGNOSIS — G809 Cerebral palsy, unspecified: Secondary | ICD-10-CM | POA: Insufficient documentation

## 2016-06-16 DIAGNOSIS — R2681 Unsteadiness on feet: Secondary | ICD-10-CM

## 2016-06-16 DIAGNOSIS — R270 Ataxia, unspecified: Secondary | ICD-10-CM

## 2016-06-16 DIAGNOSIS — Z7982 Long term (current) use of aspirin: Secondary | ICD-10-CM | POA: Insufficient documentation

## 2016-06-16 DIAGNOSIS — M810 Age-related osteoporosis without current pathological fracture: Secondary | ICD-10-CM | POA: Insufficient documentation

## 2016-06-16 DIAGNOSIS — T426X5A Adverse effect of other antiepileptic and sedative-hypnotic drugs, initial encounter: Secondary | ICD-10-CM | POA: Insufficient documentation

## 2016-06-16 DIAGNOSIS — G4733 Obstructive sleep apnea (adult) (pediatric): Secondary | ICD-10-CM | POA: Insufficient documentation

## 2016-06-16 DIAGNOSIS — R531 Weakness: Secondary | ICD-10-CM | POA: Insufficient documentation

## 2016-06-16 DIAGNOSIS — I6602 Occlusion and stenosis of left middle cerebral artery: Secondary | ICD-10-CM | POA: Diagnosis not present

## 2016-06-16 DIAGNOSIS — Z87442 Personal history of urinary calculi: Secondary | ICD-10-CM | POA: Insufficient documentation

## 2016-06-16 DIAGNOSIS — G40909 Epilepsy, unspecified, not intractable, without status epilepticus: Secondary | ICD-10-CM | POA: Insufficient documentation

## 2016-06-16 DIAGNOSIS — N401 Enlarged prostate with lower urinary tract symptoms: Secondary | ICD-10-CM | POA: Insufficient documentation

## 2016-06-16 DIAGNOSIS — R42 Dizziness and giddiness: Principal | ICD-10-CM | POA: Insufficient documentation

## 2016-06-16 DIAGNOSIS — G9389 Other specified disorders of brain: Secondary | ICD-10-CM | POA: Insufficient documentation

## 2016-06-16 LAB — URINALYSIS, COMPLETE (UACMP) WITH MICROSCOPIC
Bacteria, UA: NONE SEEN
Bilirubin Urine: NEGATIVE
Glucose, UA: NEGATIVE mg/dL
Hgb urine dipstick: NEGATIVE
Ketones, ur: 5 mg/dL — AB
Nitrite: NEGATIVE
Protein, ur: NEGATIVE mg/dL
Specific Gravity, Urine: 1.017 (ref 1.005–1.030)
Squamous Epithelial / LPF: NONE SEEN
pH: 7 (ref 5.0–8.0)

## 2016-06-16 LAB — CBC WITH DIFFERENTIAL/PLATELET
Basophils Absolute: 0.1 10*3/uL (ref 0–0.1)
Basophils Relative: 1 %
Eosinophils Absolute: 0 10*3/uL (ref 0–0.7)
Eosinophils Relative: 0 %
HCT: 42.9 % (ref 40.0–52.0)
Hemoglobin: 15.2 g/dL (ref 13.0–18.0)
Lymphocytes Relative: 20 %
Lymphs Abs: 1.3 10*3/uL (ref 1.0–3.6)
MCH: 34.4 pg — ABNORMAL HIGH (ref 26.0–34.0)
MCHC: 35.4 g/dL (ref 32.0–36.0)
MCV: 97 fL (ref 80.0–100.0)
Monocytes Absolute: 0.6 10*3/uL (ref 0.2–1.0)
Monocytes Relative: 9 %
Neutro Abs: 4.6 10*3/uL (ref 1.4–6.5)
Neutrophils Relative %: 70 %
Platelets: 381 10*3/uL (ref 150–440)
RBC: 4.42 MIL/uL (ref 4.40–5.90)
RDW: 12.4 % (ref 11.5–14.5)
WBC: 6.5 10*3/uL (ref 3.8–10.6)

## 2016-06-16 LAB — COMPREHENSIVE METABOLIC PANEL
ALT: 15 U/L — ABNORMAL LOW (ref 17–63)
AST: 25 U/L (ref 15–41)
Albumin: 4 g/dL (ref 3.5–5.0)
Alkaline Phosphatase: 93 U/L (ref 38–126)
Anion gap: 9 (ref 5–15)
BUN: 16 mg/dL (ref 6–20)
CO2: 27 mmol/L (ref 22–32)
Calcium: 9.9 mg/dL (ref 8.9–10.3)
Chloride: 101 mmol/L (ref 101–111)
Creatinine, Ser: 0.82 mg/dL (ref 0.61–1.24)
GFR calc Af Amer: >60 mL/min (ref >60)
GFR calc non Af Amer: >60 mL/min (ref >60)
Glucose, Bld: 178 mg/dL — ABNORMAL HIGH (ref 65–99)
Potassium: 3.5 mmol/L (ref 3.5–5.1)
Sodium: 137 mmol/L (ref 135–145)
Total Bilirubin: 1.1 mg/dL (ref 0.3–1.2)
Total Protein: 7.4 g/dL (ref 6.5–8.1)

## 2016-06-16 LAB — TROPONIN I: Troponin I: <0.03 ng/mL (ref <0.03)

## 2016-06-16 LAB — PHENYTOIN LEVEL, TOTAL: Phenytoin Lvl: 11.8 ug/mL (ref 10.0–20.0)

## 2016-06-16 MED ORDER — MECLIZINE HCL 25 MG PO TABS
12.5000 mg | ORAL_TABLET | Freq: Two times a day (BID) | ORAL | Status: DC
  Administered 2016-06-16 – 2016-06-17 (×3): 12.5 mg via ORAL
  Filled 2016-06-16 (×3): qty 1

## 2016-06-16 MED ORDER — ACETAMINOPHEN 650 MG RE SUPP: 650.0000 mg | RECTAL | Status: DC | PRN

## 2016-06-16 MED ORDER — TAMSULOSIN HCL 0.4 MG PO CAPS
0.4000 mg | ORAL_CAPSULE | Freq: Every day | ORAL | Status: DC
  Administered 2016-06-16 – 2016-06-18 (×3): 0.4 mg via ORAL
  Filled 2016-06-16 (×3): qty 1

## 2016-06-16 MED ORDER — ENOXAPARIN SODIUM 40 MG/0.4ML ~~LOC~~ SOLN: 40.0000 mg | SUBCUTANEOUS | Status: DC

## 2016-06-16 MED ORDER — ASPIRIN EC 81 MG PO TBEC
81.0000 mg | DELAYED_RELEASE_TABLET | Freq: Every day | ORAL | Status: DC
  Administered 2016-06-17 – 2016-06-18 (×2): 81 mg via ORAL
  Filled 2016-06-16 (×2): qty 1

## 2016-06-16 MED ORDER — SODIUM CHLORIDE 0.9 % IV SOLN
1000.0000 mL | Freq: Once | INTRAVENOUS | Status: AC
  Administered 2016-06-16: 1000 mL via INTRAVENOUS

## 2016-06-16 MED ORDER — ASPIRIN 81 MG PO CHEW
324.0000 mg | CHEWABLE_TABLET | Freq: Once | ORAL | Status: AC
  Administered 2016-06-16: 324 mg via ORAL
  Filled 2016-06-16: qty 4

## 2016-06-16 MED ORDER — PHENYTOIN SODIUM EXTENDED 100 MG PO CAPS
300.0000 mg | ORAL_CAPSULE | Freq: Every day | ORAL | Status: DC
  Administered 2016-06-16 – 2016-06-18 (×3): 300 mg via ORAL
  Filled 2016-06-16 (×4): qty 3

## 2016-06-16 MED ORDER — ENOXAPARIN SODIUM 40 MG/0.4ML ~~LOC~~ SOLN
40.0000 mg | SUBCUTANEOUS | Status: DC
  Administered 2016-06-17: 40 mg via SUBCUTANEOUS
  Filled 2016-06-16: qty 0.4

## 2016-06-16 MED ORDER — ACETAMINOPHEN 325 MG PO TABS: 650.0000 mg | ORAL_TABLET | ORAL | Status: DC | PRN

## 2016-06-16 MED ORDER — LEVETIRACETAM 500 MG PO TABS
1000.0000 mg | ORAL_TABLET | Freq: Two times a day (BID) | ORAL | Status: DC
  Administered 2016-06-16 (×2): 1000 mg via ORAL
  Filled 2016-06-16 (×2): qty 2

## 2016-06-16 MED ORDER — STROKE: EARLY STAGES OF RECOVERY BOOK
Freq: Once | Status: AC
  Administered 2016-06-16: 18:00:00

## 2016-06-16 MED ORDER — FINASTERIDE 5 MG PO TABS
5.0000 mg | ORAL_TABLET | Freq: Every day | ORAL | Status: DC
  Administered 2016-06-16 – 2016-06-18 (×3): 5 mg via ORAL
  Filled 2016-06-16 (×3): qty 1

## 2016-06-16 MED ORDER — ACETAMINOPHEN 160 MG/5ML PO SOLN
650.0000 mg | ORAL | Status: DC | PRN
  Filled 2016-06-16: qty 20.3

## 2016-06-16 NOTE — ED Notes (Signed)
Patient left for MRI.

## 2016-06-16 NOTE — ED Notes (Signed)
Patient states he has a small amount of dizziness when sitting in a semi-Fowler's position. Patient states dizziness has slightly improved.

## 2016-06-16 NOTE — ED Notes (Signed)
Patient is back from MRI  

## 2016-06-16 NOTE — ED Notes (Signed)
Patient left for ultrasound.

## 2016-06-16 NOTE — ED Notes (Signed)
Keppra and Meclizine given at this time after talking with pharmacist and admitting MD.

## 2016-06-16 NOTE — ED Notes (Signed)
Patient still c/o dizziness when sitting upright.

## 2016-06-16 NOTE — ED Triage Notes (Signed)
States he woke up this AM at 0100 to use the bathroom and states dizziness, felt like the room was spinning, states he went back to sleep and when he woke up 0800 he was still dizzy, awake and alert upon arrival, EDP at bedside, denies any pain

## 2016-06-16 NOTE — H&P (Signed)
Dylan Sullivan at Dylan Sullivan: Dylan Sullivan    MR#:  [id number]  [place] OF BIRTH:  04/02/1946  DATE OF ADMISSION:  06/16/2016  PRIMARY CARE PHYSICIAN: Casilda Carls   REQUESTING/REFERRING PHYSICIAN: Dr. Esperanza Heir.  CHIEF COMPLAINT: Dizziness, ataxia since last night.    Chief Complaint  Patient presents with  . Dizziness    HISTORY OF PRESENT ILLNESS:  [name]  is a 71 y.o. 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 Patient feels very dizzy, dizziness more when he sits up. Also has unstable gait. Patient has congenital right-sided weakness but the recording to him he is getting more able to ambulate well with his left leg and balance, having dizziness, ataxia more than usual since last night. Patient preferred to go to [facility] , but they are  On diversion and patient agreed to stay here. Patient has partial seizure history.  PAST MEDICAL HISTORY:   Past Medical History:  Diagnosis Date  . BPH (benign prostatic hyperplasia)   . Cerebral palsy ([place])   . Elevated PSA   . Hematuria   . Hemiplegia ([place]) 09/13/2011   Overview:  Upper extremity most affected   . Hydronephrosis   . Kidney stone   . Nocturia   . Obstructive apnea 12/29/2014  . Osteoporosis   . 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 Seizures ([place])   . Sleep apnea   . Urinary retention     PAST SURGICAL HISTOIRY:   Past Surgical History:  Procedure Laterality Date  . APPENDECTOMY    . arm surgery Right    child  . BRAIN SURGERY    . LEG SURGERY Right    child  . TONSILLECTOMY      SOCIAL HISTORY:   Social History  Substance Use Topics  . Smoking status: Never Smoker  . Smokeless tobacco: Never Used  . Alcohol use No     FAMILY HISTORY:   Family History  Problem Relation Age of Onset  . Breast cancer Maternal Aunt   . Prostate cancer Neg Hx   . Bladder Cancer Neg Hx   . Kidney cancer Neg Hx     DRUG ALLERGIES:  No Known Allergies  REVIEW OF SYSTEMS:  CONSTITUTIONAL: No fever, fatigue or weakness.  EYES: No blurred or double vision.  EARS, NOSE, AND THROAT: No tinnitus or ear pain.  RESPIRATORY: No cough, shortness of breath, wheezing or hemoptysis.  CARDIOVASCULAR: No chest pain, orthopnea, edema.  GASTROINTESTINAL: No nausea, vomiting, diarrhea or abdominal pain.  GENITOURINARY: No dysuria, hematuria.  ENDOCRINE: No polyuria, nocturia,  HEMATOLOGY: No anemia, easy bruising or bleeding SKIN: No rash or lesion. MUSCULOSKELETAL: No joint pain or arthritis.   NEUROLOGIC: No tingling, numbness, weakness.  PSYCHIATRY: No anxiety or depression.   MEDICATIONS AT HOME:   Prior to Admission medications   Medication Sig Start Date End Date Taking? Authorizing Provider  finasteride (PROSCAR) 5 MG tablet Take 1 tablet (5 mg total) by mouth daily. 04/29/16  Yes Shannon A McGowan, PA-C  hydrochlorothiazide (HYDRODIURIL) 25 MG tablet Take 25 mg by mouth daily.   Yes Historical Provider, MD  levETIRAcetam (KEPPRA) 1000 MG tablet Take 1,000 mg by mouth 2 (two) times daily.  10/08/15  Yes Historical Provider, MD  phenytoin (  DILANTIN) 100 MG ER capsule Take 300 mg by mouth daily.   Yes Historical Provider, MD  tamsulosin (FLOMAX) 0.4 MG CAPS capsule TAKE ONE CAPSULE BY MOUTH EVERY DAY 02/19/16  Yes Shannon A McGowan, PA-C      VITAL SIGNS:  Blood pressure 139/73, pulse 76, temperature 98.3 F (36.8 C), temperature source Oral, resp. rate 18, height 6' (1.829 m), weight 75.3 kg (166 lb), SpO2 98 %.  PHYSICAL EXAMINATION:  GENERAL:  71 y.o.-year-old patient lying in the bed with no acute distress.  EYES: Pupils equal, round, reactive to light and accommodation. No scleral icterus. Extraocular muscles  intact.  HEENT: Head atraumatic, normocephalic. Oropharynx and nasopharynx clear.  NECK:  Supple, no jugular venous distention. No thyroid enlargement, no tenderness.  LUNGS: Normal breath sounds bilaterally, no wheezing, rales,rhonchi or crepitation. No use of accessory muscles of respiration.  CARDIOVASCULAR: S1, S2 normal. No murmurs, rubs, or gallops.  ABDOMEN: Soft, nontender, nondistended. Bowel sounds present. No organomegaly or mass.  EXTREMITIES: No pedal edema, cyanosis, or clubbing.  NEUROLOGIC: Cranial nerves II through XII are intact. Weakness of the right upper and lower extremity because of congenital right hemiplegia. Left upper, lower extremity muscle strength 5 over 5. Finger-nose test is intact. Gait not tested. PSYCHIATRIC: The patient is alert and oriented x 3.  SKIN: No obvious rash, lesion, or ulcer.   LABORATORY PANEL:   CBC  Recent Labs Lab 06/16/16 0853  WBC 6.5  HGB 15.2  HCT 42.9  PLT 381   ------------------------------------------------------------------------------------------------------------------  Chemistries   Recent Labs Lab 06/16/16 0853  NA 137  K 3.5  CL 101  CO2 27  GLUCOSE 178*  BUN 16  CREATININE 0.82  CALCIUM 9.9  AST 25  ALT 15*  ALKPHOS 93  BILITOT 1.1   ------------------------------------------------------------------------------------------------------------------  Cardiac Enzymes  Recent Labs Lab 06/16/16 0853  TROPONINI <0.03   ------------------------------------------------------------------------------------------------------------------  RADIOLOGY:  Ct Head Wo Contrast  Result Date: 06/16/2016 CLINICAL DATA:  Altered mental status EXAM: CT HEAD WITHOUT CONTRAST TECHNIQUE: Contiguous axial images were obtained from the base of the skull through the vertex without intravenous contrast. COMPARISON:  None. FINDINGS: Brain: No evidence of acute infarction, hemorrhage, extra-axial collection, ventriculomegaly, or  mass effect. Large area of encephalomalacia involving the left MCA territory. Generalized cerebral atrophy. Periventricular white matter low attenuation likely secondary to microangiopathy. Vascular: Cerebrovascular atherosclerotic calcifications are noted. Skull: No acute osseous abnormality. Prior left frontoparietal craniotomy. Sinuses/Orbits: Visualized portions of the orbits are unremarkable. Visualized portions of the paranasal sinuses and mastoid air cells are unremarkable. Other: None. IMPRESSION: 1. No acute intracranial pathology. 2. 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 Electronically Signed   By: Kathreen Devoid   On: 06/16/2016 09:26   Mr Angiogram Head Wo Contrast  Result Date: 06/16/2016 CLINICAL DATA:  Ataxia.  History of seizures and brain surgery. EXAM: MRI HEAD WITHOUT CONTRAST MRA HEAD WITHOUT CONTRAST TECHNIQUE: Multiplanar, multiecho pulse sequences of the brain and surrounding structures were obtained without intravenous contrast. Angiographic images of the head were obtained using MRA technique without contrast. COMPARISON:  Head CT 06/16/2016 FINDINGS: MRI HEAD FINDINGS Brain: Left-sided craniotomy changes are again seen with associated susceptibility artifact. There is extensive cystic encephalomalacia throughout the left cerebral hemisphere involving much of the temporal, parietal, and frontal lobes as well as basal ganglia and thalamus. There is marked ex vacuo dilatation of the left lateral ventricle with mild surrounding gliosis in the remaining brain parenchyma. Left-sided corticospinal tract wallerian degeneration is noted in  the brainstem. A few punctate foci of T2 hyperintensity in the white matter of the right cerebral hemisphere are within normal limits for age. Thin CSF signal intensity fluid collection along the right aspect of the interhemispheric fissure towards the vertex measures up to 6 mm in thickness, consistent with a subdural hygroma and without  associated mass effect. No focal cerebellar abnormality. Limited assessment of the seventh and eighth cranial nerve complexes on this nondedicated study is unremarkable. Vascular: Nonvisualization of the left MCA, more fully evaluated below. Skull and upper cervical spine: Left-sided craniotomy changes. No focal marrow lesion. Sinuses/Orbits: Unremarkable orbits. Paranasal sinuses and mastoid air cells are clear. Other: None. MRA HEAD FINDINGS The visualized distal vertebral arteries are patent to the basilar with the right being mildly dominant. Left PICA and right AICA appear dominant. SCA origins are patent. Basilar artery is widely patent. There are small posterior communicating arteries bilaterally. PCAs are patent with asymmetric attenuation of the P2 and more distal segments on the left but no evidence of significant focal proximal stenosis. The internal carotid arteries are patent from skullbase to carotid termini without evidence of significant stenosis. The right A1 segment is hypoplastic. Left A1 segment is widely patent. Right MCA is patent without evidence of significant proximal stenosis. 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 No intracranial aneurysm is identified. IMPRESSION: 1. No acute intracranial abnormality. 2. Extensive left cerebral hemispheric encephalomalacia. 3. Occluded left MCA. Electronically Signed   By: Logan Bores M.D.   On: 06/16/2016 12:43   Mr Brain Wo Contrast  Result Date: 06/16/2016 CLINICAL DATA:  Ataxia.  History of seizures and brain surgery. EXAM: MRI HEAD WITHOUT CONTRAST MRA HEAD WITHOUT CONTRAST TECHNIQUE: Multiplanar, multiecho pulse sequences of the brain and surrounding structures were obtained without intravenous contrast. Angiographic images of the head were obtained using MRA technique without contrast. COMPARISON:  Head CT 06/16/2016 FINDINGS: MRI HEAD FINDINGS Brain: Left-sided craniotomy changes are again seen with associated susceptibility artifact. There  is extensive cystic encephalomalacia throughout the left cerebral hemisphere involving much of the temporal, parietal, and frontal lobes as well as basal ganglia and thalamus. There is marked ex vacuo dilatation of the left lateral ventricle with mild surrounding gliosis in the remaining brain parenchyma. Left-sided corticospinal tract wallerian degeneration is noted in the brainstem. A few punctate foci of T2 hyperintensity in the white matter of the right cerebral hemisphere are within normal limits for age. Thin CSF signal intensity fluid collection along the right aspect of the interhemispheric fissure towards the vertex measures up to 6 mm in thickness, consistent with a subdural hygroma and without associated mass effect. No focal cerebellar abnormality. Limited assessment of the seventh and eighth cranial nerve complexes on this nondedicated study is unremarkable. Vascular: Nonvisualization of the left MCA, more fully evaluated below. Skull and upper cervical spine: Left-sided craniotomy changes. No focal marrow lesion. Sinuses/Orbits: Unremarkable orbits. Paranasal sinuses and mastoid air cells are clear. Other: None. MRA HEAD FINDINGS The visualized distal vertebral arteries are patent to the basilar with the right being mildly dominant. Left PICA and right AICA appear dominant. SCA origins are patent. Basilar artery is widely patent. There are small posterior communicating arteries bilaterally. PCAs are patent with asymmetric attenuation of the P2 and more distal segments on the left but no evidence of significant focal proximal stenosis. The internal carotid arteries are patent from skullbase to carotid termini without evidence of significant stenosis. The right A1 segment is hypoplastic. Left A1 segment is widely  patent. Right MCA is patent without evidence of significant proximal stenosis. 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 No intracranial aneurysm is identified. IMPRESSION: 1. No acute  intracranial abnormality. 2. Extensive left cerebral hemispheric encephalomalacia. 3. Occluded left MCA. Electronically Signed   By: Logan Bores M.D.   On: 06/16/2016 12:43    EKG:   Orders placed or performed during the hospital encounter of 06/16/16  . ED EKG  . ED EKG  . EKG 12-Lead  . EKG 12-Lead  Normal sinus rhythm, 76 bpm, no ST T changes.  IMPRESSION AND PLAN:   #71 year old male patient with ataxia, dizziness since last night initial CT head unremarkable MRA showed encephalomalacia in the left side, an acute stroke. Patient very unstable, dizzy start empiric meclizine, check echocardiogram, physical therapy evaluation. Lives alone and he says his dizziness is completely different and  he never felt like this. Kept in observation to rule out posterior the circulation stroke also started on meclizine for possible positional vertigo.  #2 obstructive sleep apnea: Patient is on CPAP at night  #3 history of dense right hemiplegia;    #4 history of partial seizures very well controlled continue Keppra, Dilantin, follows up with neurology at [facility], followed by him on January   2 /2018 .according to Epic care everywhere. According to him patient last   a seizure in 1962 the brain surgery. 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  Reviewed the documentation from the neurologist, primary doctor  In Epic care everywhere.   All the records are reviewed and case discussed with ED provider. Management plans discussed with the patient, family and they are in agreement.  CODE STATUS: full  TOTAL TIME TAKING CARE OF THIS PATIENT: 55 minutes.    Epifanio Lesches M.D on 06/16/2016 at 1:01 PM  Between 7am to 6pm - Pager - [phone]  After 6pm go to [email or web address] - password [email or web address] [facility]  Office  [phone]  CC: Primary care physician; Casilda Carls  Note: This dictation was prepared with Dragon dictation along with smaller  phrase technology. Any transcriptional errors that result from this process are unintentional.

## 2016-06-16 NOTE — ED Notes (Signed)
Attempted to obtain urine sample, pt sat on side of bed and immediately fell back in bed stating he was too dizzy to sit

## 2016-06-16 NOTE — ED Provider Notes (Signed)
[facility] Emergency Department Provider Note        Time seen: ----------------------------------------- 8:56 AM on 06/16/2016 -----------------------------------------    I have reviewed the triage vital signs and the nursing notes.   HISTORY  Chief Complaint Dizziness    HPI Dylan Sullivan is a 71 y.o. male who presents to the ER for sudden onset of "dizziness" when he woke up this morning. Patient states he just does not have any balance. This is not happened to him before, laying down makes it better, standing up seems to make his symptoms worse. Patient denies any recent illness or changes in his medicines. Reportedly he had had elevated Levels in the past. His taking his medicines as prescribed.   Past Medical History:  Diagnosis Date  . BPH (benign prostatic hyperplasia)   . Cerebral palsy ([place])   . Elevated PSA   . Hematuria   . 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 Hydronephrosis   . Kidney stone   . Nocturia   . Obstructive apnea 12/29/2014  . Osteoporosis   . 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 Seizures ([place])   . Sleep apnea   . Urinary retention     Patient Active Problem List   Diagnosis Date Noted  . BPH with obstruction/lower urinary tract symptoms 04/20/2015  . History of elevated PSA 04/20/2015  . BP (high blood pressure) 04/16/2015  . Obstructive apnea 12/29/2014  . Encounter for therapeutic drug level monitoring 09/25/2014  . Hemiplegia ([place]) 09/13/2011  . 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 Breath shortness 03/13/2008    Past Surgical History:  Procedure Laterality Date  . APPENDECTOMY    . arm surgery Right    child  . BRAIN SURGERY    . LEG SURGERY Right    child  . TONSILLECTOMY      Allergies Patient has no known  allergies.  Social History Social History  Substance Use Topics  . Smoking status: Never Smoker  . Smokeless tobacco: Never Used  . Alcohol use No    Review of Systems Constitutional: Negative for fever. Cardiovascular: Negative for chest pain. Respiratory: Negative for shortness of breath. Gastrointestinal: Negative for abdominal pain, vomiting and diarrhea. Genitourinary: Negative for dysuria. Musculoskeletal: Negative for back pain. Skin: Negative for rash. Neurological: Negative for headaches, focal weakness or numbness. Positive for ataxia  10-point ROS otherwise negative.  ____________________________________________   PHYSICAL EXAM:  VITAL SIGNS: ED Triage Vitals  Enc Vitals Group     BP 06/16/16 0853 130/78     Pulse Rate 06/16/16 0853 78     Resp 06/16/16 0853 18     Temp 06/16/16 0853 98.3 F (36.8 C)     Temp Source 06/16/16 0853 Oral     SpO2 06/16/16 0850 98 %     Weight 06/16/16 0854 166 lb (75.3 kg)     Height 06/16/16 0854 6' (1.829 m)     Head Circumference --      Peak Flow --      Pain Score --      Pain Loc --      Pain Edu? --      Excl. in [place]? --    Constitutional: Alert and oriented. Well appearing and in no distress. Eyes: Conjunctivae are normal. PERRL. Normal extraocular movements. ENT   Head: Normocephalic  and atraumatic.   Nose: No congestion/rhinnorhea.   Mouth/Throat: Mucous membranes are moist.   Neck: No stridor. Cardiovascular: Normal rate, regular rhythm. No murmurs, rubs, or gallops. Respiratory: Normal respiratory effort without tachypnea nor retractions. Breath sounds are clear and equal bilaterally. No wheezes/rales/rhonchi. Gastrointestinal: Soft and nontender. Normal bowel sounds Musculoskeletal: Nontender with normal range of motion in all extremities. No lower extremity tenderness nor edema.Positive Romberg, strength and sensation appear to be normal. Neurologic:  Normal speech and language. No gross focal  neurologic deficits are appreciated. Right upper extremity is weaker and has decreased sensation chronically compared to the left. Cranial nerves appear to be intact, finger to nose is precise on the left side, right leg with decreased sensation compared to left which she states is chronic Skin:  Skin is warm, dry and intact. No rash noted. Psychiatric: Mood and affect are normal. Speech and behavior are normal.  ____________________________________________  EKG: Interpreted by me. Sinus rhythm with a rate of 76 bpm, normal PR interval, normal QRS size, normal QT. Left axis deviation.  ____________________________________________  ED COURSE:  Pertinent labs & imaging results that were available during my care of the patient were reviewed by me and considered in my medical decision making (see chart for details). Clinical Course as of Jun 16 1037  Thu Jun 16, 2016  1029 Patient states he noticed it when he got up in the middle of night around 1 AM but went back to bed. He woke up around 7:30 or 8 with persistent dizziness and ataxia.  [JW]    Clinical Course User Index [JW] Earleen Newport, MD  Patient presents to ER with ataxia that appears sudden onset. He was not orthostatic on exam. We will assess with labs and imaging.  Procedures ____________________________________________   LABS (pertinent positives/negatives)  Labs Reviewed  CBC WITH DIFFERENTIAL/PLATELET - Abnormal; Notable for the following:       Result Value   MCH 34.4 (*)    All other components within normal limits  COMPREHENSIVE METABOLIC PANEL - Abnormal; Notable for the following:    Glucose, Bld 178 (*)    ALT 15 (*)    All other components within normal limits  TROPONIN I  PHENYTOIN LEVEL, TOTAL  URINALYSIS, COMPLETE (UACMP) WITH MICROSCOPIC    RADIOLOGY  CT head IMPRESSION: 1. No acute intracranial pathology. 2. Extensive encephalomalacia in the left MCA  territory. ____________________________________________  FINAL ASSESSMENT AND PLAN  Ataxia  Plan: Patient with labs and imaging as dictated above. Patient presents after greater than 8 hour symptom onset of balance disturbance. He is very unsteady on his feet even from a sitting position. 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 CT scan did not reveal acute abnormality, I have ordered an MRI. Patient is requesting transfer to [facility] for continuity of care.   Earleen Newport, MD   Note: This dictation was prepared with Dragon dictation. Any transcriptional errors that result from this process are unintentional    Earleen Newport, MD 06/16/16 1041

## 2016-06-17 DIAGNOSIS — R270 Ataxia, unspecified: Secondary | ICD-10-CM | POA: Diagnosis not present

## 2016-06-17 DIAGNOSIS — R42 Dizziness and giddiness: Secondary | ICD-10-CM | POA: Diagnosis not present

## 2016-06-17 LAB — LIPID PANEL
Cholesterol: 178 mg/dL (ref 0–200)
HDL: 65 mg/dL (ref >40)
LDL Cholesterol: 104 mg/dL — ABNORMAL HIGH (ref 0–99)
Total CHOL/HDL Ratio: 2.7 RATIO
Triglycerides: 44 mg/dL (ref <150)
VLDL: 9 mg/dL (ref 0–40)

## 2016-06-17 MED ORDER — LEVETIRACETAM 750 MG PO TABS
750.0000 mg | ORAL_TABLET | Freq: Two times a day (BID) | ORAL | Status: DC
  Administered 2016-06-17 – 2016-06-18 (×2): 750 mg via ORAL
  Filled 2016-06-17 (×2): qty 1

## 2016-06-17 MED ORDER — MECLIZINE HCL 12.5 MG PO TABS: 12.5000 mg | ORAL_TABLET | Freq: Two times a day (BID) | ORAL | 0 refills | Status: DC | PRN

## 2016-06-17 MED ORDER — MECLIZINE HCL 25 MG PO TABS
12.5000 mg | ORAL_TABLET | Freq: Three times a day (TID) | ORAL | Status: DC
  Administered 2016-06-17 – 2016-06-18 (×3): 12.5 mg via ORAL
  Filled 2016-06-17 (×3): qty 1

## 2016-06-17 NOTE — Care Management (Signed)
Admitted to [facility] with the diagnosis od ataxia. Lives alone. Friend of 20 years is Maye Hides [phone]). Last seen Dr. Rosario Jacks 3 months ago. Prescriptions are filled at [place]. No home health. No skilled facility, No home oxygen. CPAP in the home 10-15 years. CPAP provided by [facility]. No falls. Good appetite. No Life Alert. Takes care of all basic activities of daily living himself, drives. Friend will transport. Shelbie Ammons RN MSN CCM Care Management

## 2016-06-17 NOTE — Discharge Summary (Deleted)
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 BPH (benign prostatic hyperplasia)   . Cerebral palsy ([place])   . Elevated PSA   . Hematuria   . Hemiplegia ([place]) 09/13/2011   Overview:  Upper extremity most affected   . Hydronephrosis   . Kidney stone   . Nocturia   . Obstructive apnea 12/29/2014  . Osteoporosis   . 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 Right hemiplegia ([place]) 09/13/2011  . Seizures ([place])   . Sleep apnea   . Urinary retention     HOSPITAL COURSE:  Dylan Sullivan  is a 71 y.o. male with a known history of Congenital right-sided weakness, history of seizure disorder after pain operation in 1960 followed by [facility]  Neurologist on seen because of dizziness, unstable gait since last night. Patient feels very dizzy, dizziness more when he sits up. Also has unstable gait  # dizziness since last night initial - CT head unremarkable  -MRI/ MRA showed encephalomalacia in the left side -no  acute stroke.  -Patient very unstable, dizzy---much improved today. No URI symptoms -on empiric meclizine - echocardiogram as out pt WNL - on meclizine for possible positional vertigo.  #2 obstructive sleep apnea: Patient is on CPAP at night  #3 history of dense right hemiplegia-chronic  #4 history of partial seizures very well controlled continue Keppra, Dilantin - follows up with neurology at [facility] -just saw his [facility] few days ago as routine f/u  Will d/c home after pt ambulates CONSULTS OBTAINED:  Treatment  Team:  Dylan Sullivan  DRUG ALLERGIES:  No Known Allergies  DISCHARGE MEDICATIONS:   Current Discharge Medication List    START taking these medications   Details  meclizine (ANTIVERT) 12.5 MG tablet Take 1 tablet (12.5 mg total) by mouth 2 (two) times daily as needed for dizziness. Qty: 30 tablet, Refills: 0      CONTINUE these medications which have NOT CHANGED   Details  acidophilus (RISAQUAD) CAPS capsule Take 1 capsule by mouth daily.    aspirin EC 81 MG tablet Take 81 mg by mouth daily.    cholecalciferol (VITAMIN D) 1000 units tablet Take 5,000 Units by mouth daily.    CRANBERRY PO Take 1 capsule by mouth daily.    finasteride (PROSCAR) 5 MG tablet Take 1 tablet (5 mg total) by mouth daily. Qty: 30 tablet, Refills: 12   Associated Diagnoses: Benign prostatic hyperplasia with lower urinary tract symptoms, symptom details unspecified    Flaxseed, Linseed, (FLAX SEED OIL PO) Take 1 capsule by mouth daily.    hydrochlorothiazide (HYDRODIURIL) 25 MG tablet Take 25 mg by mouth daily.    levETIRAcetam (KEPPRA) 1000 MG tablet Take 1,000 mg by mouth 2 (two) times daily.  Refills: 2    phenytoin (DILANTIN) 100 MG ER capsule Take 300 mg by mouth daily.    tamsulosin (FLOMAX) 0.4 MG CAPS capsule TAKE ONE CAPSULE BY MOUTH EVERY DAY Qty: 30 capsule, Refills: 3   Associated Diagnoses: BPH (benign prostatic hyperplasia)    Turmeric 500 MG CAPS Take 1  capsule by mouth daily.    vitamin C (ASCORBIC ACID) 500 MG tablet Take 1,000 mg by mouth daily.        If you experience worsening of your admission symptoms, develop shortness of breath, life threatening emergency, suicidal or homicidal thoughts you must seek medical attention immediately by calling 911 or calling your Sullivan immediately  if symptoms less severe.  You Must read complete instructions/literature along with all the possible adverse reactions/side effects for all the Medicines you take and that have been  prescribed to you. Take any new Medicines after you have completely understood and accept all the possible adverse reactions/side effects.   Please note  You were cared for by a hospitalist during your hospital stay. If you have any questions about your discharge medications or the care you received while you were in the hospital after you are discharged, you can call the unit and asked to speak with the hospitalist on call if the hospitalist that took care of you is not available. Once you are discharged, your primary care physician will handle any further medical issues. Please note that NO REFILLS for any discharge medications will be authorized once you are discharged, as it is imperative that you return to your primary care physician (or establish a relationship with a primary care physician if you do not have one) for your aftercare needs so that they can reassess your need for medications and monitor your lab values. Today   SUBJECTIVE   I feel better today  VITAL SIGNS:  Blood pressure (!) 117/56, pulse 90, temperature 99.1 F (37.3 C), temperature source Oral, resp. rate 18, height 6' (1.829 m), weight 75.3 kg (166 lb), SpO2 97 %.  I/O:   Intake/Output Summary (Last 24 hours) at 06/17/16 0901 Last data filed at 06/16/16 2218  Gross per 24 hour  Intake              360 ml  Output              550 ml  Net             -190 ml    PHYSICAL EXAMINATION:  GENERAL:  71 y.o.-year-old patient lying in the bed with no acute distress.  EYES: Pupils equal, round, reactive to light and accommodation. No scleral icterus. Extraocular muscles intact.  HEENT: Head atraumatic, normocephalic. Oropharynx and nasopharynx clear.  NECK:  Supple, no jugular venous distention. No thyroid enlargement, no tenderness.  LUNGS: Normal breath sounds bilaterally, no wheezing, rales,rhonchi or crepitation. No use of accessory muscles of respiration.  CARDIOVASCULAR: S1, S2 normal. No murmurs, rubs, or gallops.   ABDOMEN: Soft, non-tender, non-distended. Bowel sounds present. No organomegaly or mass.  EXTREMITIES: No pedal edema, cyanosis, or clubbing. Chronic right UE contracture NEUROLOGIC: Cranial nerves II through XII are intact. Muscle strength 5/5 in all extremities. Sensation intact. Gait not checked.  PSYCHIATRIC: The patient is alert and oriented x 3.  SKIN: No obvious rash, lesion, or ulcer.   DATA REVIEW:   CBC   Recent Labs Lab 06/16/16 0853  WBC 6.5  HGB 15.2  HCT 42.9  PLT 381    Chemistries   Recent Labs Lab 06/16/16 0853  NA 137  K 3.5  CL 101  CO2 27  GLUCOSE 178*  BUN 16  CREATININE 0.82  CALCIUM 9.9  AST 25  ALT 15*  ALKPHOS 93  BILITOT 1.1    Microbiology Results   No results found for this or any  previous visit (from the past 240 hour(s)).  RADIOLOGY:  Ct Head Wo Contrast  Result Date: 06/16/2016 CLINICAL DATA:  Altered mental status EXAM: CT HEAD WITHOUT CONTRAST TECHNIQUE: Contiguous axial images were obtained from the base of the skull through the vertex without intravenous contrast. COMPARISON:  None. FINDINGS: Brain: No evidence of acute infarction, hemorrhage, extra-axial collection, ventriculomegaly, or mass effect. Large area of encephalomalacia involving the left MCA territory. Generalized cerebral atrophy. Periventricular white matter low attenuation likely secondary to microangiopathy. Vascular: Cerebrovascular atherosclerotic calcifications are noted. Skull: No acute osseous abnormality. Prior left frontoparietal craniotomy. Sinuses/Orbits: Visualized portions of the orbits are unremarkable. Visualized portions of the paranasal sinuses and mastoid air cells are unremarkable. Other: None. IMPRESSION: 1. No acute intracranial pathology. 2. 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 Electronically Signed   By: Kathreen Devoid   On: 06/16/2016 09:26   Mr Angiogram Head Wo Contrast  Result Date: 06/16/2016 CLINICAL DATA:  Ataxia.  History of  seizures and brain surgery. EXAM: MRI HEAD WITHOUT CONTRAST MRA HEAD WITHOUT CONTRAST TECHNIQUE: Multiplanar, multiecho pulse sequences of the brain and surrounding structures were obtained without intravenous contrast. Angiographic images of the head were obtained using MRA technique without contrast. COMPARISON:  Head CT 06/16/2016 FINDINGS: MRI HEAD FINDINGS Brain: Left-sided craniotomy changes are again seen with associated susceptibility artifact. There is extensive cystic encephalomalacia throughout the left cerebral hemisphere involving much of the temporal, parietal, and frontal lobes as well as basal ganglia and thalamus. There is marked ex vacuo dilatation of the left lateral ventricle with mild surrounding gliosis in the remaining brain parenchyma. Left-sided corticospinal tract wallerian degeneration is noted in the brainstem. A few punctate foci of T2 hyperintensity in the white matter of the right cerebral hemisphere are within normal limits for age. Thin CSF signal intensity fluid collection along the right aspect of the interhemispheric fissure towards the vertex measures up to 6 mm in thickness, consistent with a subdural hygroma and without associated mass effect. No focal cerebellar abnormality. Limited assessment of the seventh and eighth cranial nerve complexes on this nondedicated study is unremarkable. Vascular: Nonvisualization of the left MCA, more fully evaluated below. Skull and upper cervical spine: Left-sided craniotomy changes. No focal marrow lesion. Sinuses/Orbits: Unremarkable orbits. Paranasal sinuses and mastoid air cells are clear. Other: None. MRA HEAD FINDINGS The visualized distal vertebral arteries are patent to the basilar with the right being mildly dominant. Left PICA and right AICA appear dominant. SCA origins are patent. Basilar artery is widely patent. There are small posterior communicating arteries bilaterally. PCAs are patent with asymmetric attenuation of the P2 and  more distal segments on the left but no evidence of significant focal proximal stenosis. The internal carotid arteries are patent from skullbase to carotid termini without evidence of significant stenosis. The right A1 segment is hypoplastic. Left A1 segment is widely patent. Right MCA is patent without evidence of significant proximal stenosis. 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 No intracranial aneurysm is identified. IMPRESSION: 1. No acute intracranial abnormality. 2. Extensive left cerebral hemispheric encephalomalacia. 3. Occluded left MCA. Electronically Signed   By: Logan Bores M.D.   On: 06/16/2016 12:43   Mr Brain Wo Contrast  Result Date: 06/16/2016 CLINICAL DATA:  Ataxia.  History of seizures and brain surgery. EXAM: MRI HEAD WITHOUT CONTRAST MRA HEAD WITHOUT CONTRAST TECHNIQUE: Multiplanar, multiecho pulse sequences of the brain and surrounding structures were obtained without intravenous contrast. Angiographic images of the head were obtained using MRA technique  without contrast. COMPARISON:  Head CT 06/16/2016 FINDINGS: MRI HEAD FINDINGS Brain: Left-sided craniotomy changes are again seen with associated susceptibility artifact. There is extensive cystic encephalomalacia throughout the left cerebral hemisphere involving much of the temporal, parietal, and frontal lobes as well as basal ganglia and thalamus. There is marked ex vacuo dilatation of the left lateral ventricle with mild surrounding gliosis in the remaining brain parenchyma. Left-sided corticospinal tract wallerian degeneration is noted in the brainstem. A few punctate foci of T2 hyperintensity in the white matter of the right cerebral hemisphere are within normal limits for age. Thin CSF signal intensity fluid collection along the right aspect of the interhemispheric fissure towards the vertex measures up to 6 mm in thickness, consistent with a subdural hygroma and without associated mass effect. No focal cerebellar abnormality.  Limited assessment of the seventh and eighth cranial nerve complexes on this nondedicated study is unremarkable. Vascular: Nonvisualization of the left MCA, more fully evaluated below. Skull and upper cervical spine: Left-sided craniotomy changes. No focal marrow lesion. Sinuses/Orbits: Unremarkable orbits. Paranasal sinuses and mastoid air cells are clear. Other: None. MRA HEAD FINDINGS The visualized distal vertebral arteries are patent to the basilar with the right being mildly dominant. Left PICA and right AICA appear dominant. SCA origins are patent. Basilar artery is widely patent. There are small posterior communicating arteries bilaterally. PCAs are patent with asymmetric attenuation of the P2 and more distal segments on the left but no evidence of significant focal proximal stenosis. The internal carotid arteries are patent from skullbase to carotid termini without evidence of significant stenosis. The right A1 segment is hypoplastic. Left A1 segment is widely patent. Right MCA is patent without evidence of significant proximal stenosis. 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 No intracranial aneurysm is identified. IMPRESSION: 1. No acute intracranial abnormality. 2. Extensive left cerebral hemispheric encephalomalacia. 3. Occluded left MCA. Electronically Signed   By: Logan Bores M.D.   On: 06/16/2016 12:43   US Carotid Bilateral (at [facility] Only)  Result Date: 06/16/2016 CLINICAL DATA:  Ataxia EXAM: BILATERAL CAROTID DUPLEX ULTRASOUND TECHNIQUE: Pearline Cables scale imaging, color Doppler and duplex ultrasound were performed of bilateral carotid and vertebral arteries in the neck. COMPARISON:  None. FINDINGS: Criteria: Quantification of carotid stenosis is based on velocity parameters that correlate the residual internal carotid diameter with NASCET-based stenosis levels, using the diameter of the distal internal carotid lumen as the denominator for stenosis measurement. The following velocity measurements  were obtained: RIGHT ICA:  114/26 cm/sec CCA:  [id number] cm/sec SYSTOLIC ICA/CCA RATIO:  0.8 DIASTOLIC ICA/CCA RATIO:  1.3 ECA:  105 cm/sec LEFT ICA:  91/23 cm/sec CCA:  [id number] cm/sec SYSTOLIC ICA/CCA RATIO:  0.7 DIASTOLIC ICA/CCA RATIO:  1.2 ECA:  95 cm/sec RIGHT CAROTID ARTERY: Grayscale images show no significant atherosclerotic plaque formation. The waveforms, velocities and flow velocity ratios show no evidence of focal hemodynamically significant stenosis. RIGHT VERTEBRAL ARTERY:  Antegrade in nature. LEFT CAROTID ARTERY: Grayscale images show no significant atherosclerotic plaque formation. The waveforms, velocities and flow velocity ratios show no evidence of focal hemodynamically significant stenosis LEFT VERTEBRAL ARTERY:  Antegrade in nature. IMPRESSION: No evidence of significant plaque formation or focal hemodynamically significant stenosis. Electronically Signed   By: Inez Catalina M.D.   On: 06/16/2016 13:57     Management plans discussed with the patient, family and they are in agreement.  CODE STATUS:     Code Status Orders        Start  Ordered   06/16/16 1125  Full code  Continuous     06/16/16 1127    Code Status History    Date Active Date Inactive Code Status Order ID Comments User Context   This patient has a current code status but no historical code status.      TOTAL TIME TAKING CARE OF THIS PATIENT: 40 minutes.    Maely Clements M.D on 06/17/2016 at 9:01 AM  Between 7am to 6pm - Pager - [phone] After 6pm go to [email or web address] - password [email or web address] [facility]  [facility]  Office  [phone]  CC: Primary care physician; Dylan Sullivan

## 2016-06-17 NOTE — Consult Note (Signed)
Referring Physician: Posey Pronto    Chief Complaint: Dizziness  HPI: Dylan Sullivan is an 71 y.o. male with a history of intractable seizures s/p surgical intervention who reports that he went to bed on 1/3 and was at baseline.  He awakened in the middle of the night (around 1AM) and noted that he was unable to balance himself to walk.  Felt dizzy just sitting up in bed.  Reports that he was recently seen at his PCP and a Keppra level was drawn.  Reports that he was notified that it was high.  Earlier this week saw his neurologist at [facility] and a repeat level was drawn.  Patient has not had a recent change in dosage.    Date last known well: Date: 06/15/2016 Time last known well: Time: 22:30 tPA Given: No: Outside time window  Past Medical History:  Diagnosis Date  . BPH (benign prostatic hyperplasia)   . Cerebral palsy ([place])   . Elevated PSA   . Hematuria   . Hemiplegia ([place]) 09/13/2011   Overview:  Upper extremity most affected   . Hydronephrosis   . Kidney stone   . Nocturia   . Obstructive apnea 12/29/2014  . Osteoporosis   . 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 Seizures ([place])   . Sleep apnea   . Urinary retention     Past Surgical History:  Procedure Laterality Date  . APPENDECTOMY    . arm surgery Right    child  . BRAIN SURGERY    . LEG SURGERY Right    child  . TONSILLECTOMY      Family History  Problem Relation Age of Onset  . Breast cancer Maternal Aunt   . Prostate cancer Neg Hx   . Bladder Cancer Neg Hx   . Kidney cancer Neg Hx    Social History:  reports that he has never smoked. He has never used smokeless tobacco. He reports that he does not drink alcohol or use drugs.  Allergies: No Known Allergies  Medications:  I have reviewed the patient's current medications. Prior to Admission:  Prescriptions Prior to Admission  Medication Sig Dispense Refill Last Dose  . acidophilus (RISAQUAD) CAPS  capsule Take 1 capsule by mouth daily.   06/15/2016 at 0900  . aspirin EC 81 MG tablet Take 81 mg by mouth daily.   06/15/2016 at 0900  . cholecalciferol (VITAMIN D) 1000 units tablet Take 5,000 Units by mouth daily.   06/15/2016 at 0900  . CRANBERRY PO Take 1 capsule by mouth daily.   06/15/2016 at 0900  . finasteride (PROSCAR) 5 MG tablet Take 1 tablet (5 mg total) by mouth daily. 30 tablet 12 06/15/2016 at 0900  . Flaxseed, Linseed, (FLAX SEED OIL PO) Take 1 capsule by mouth daily.   06/15/2016 at 0900  . hydrochlorothiazide (HYDRODIURIL) 25 MG tablet Take 25 mg by mouth daily.   06/15/2016 at 0900  . levETIRAcetam (KEPPRA) 1000 MG tablet Take 1,000 mg by mouth 2 (two) times daily.   2 06/15/2016 at 1930  . phenytoin (DILANTIN) 100 MG ER capsule Take 300 mg by mouth daily.   06/15/2016 at [place]  . tamsulosin (FLOMAX) 0.4 MG CAPS capsule TAKE ONE CAPSULE BY MOUTH EVERY DAY 30 capsule 3 06/15/2016 at 0900  . Turmeric 500 MG CAPS Take 1 capsule by mouth daily.   06/15/2016 at 0900  . vitamin C (ASCORBIC ACID) 500 MG tablet Take 1,000 mg by  mouth daily.   06/15/2016 at 0900   Scheduled: . aspirin EC  81 mg Oral Daily  . enoxaparin (LOVENOX) injection  40 mg Subcutaneous Q24H  . finasteride  5 mg Oral Daily  . levETIRAcetam  750 mg Oral BID  . meclizine  12.5 mg Oral TID  . phenytoin  300 mg Oral Daily  . tamsulosin  0.4 mg Oral Daily    ROS: History obtained from the patient  General ROS: negative for - chills, fatigue, fever, night sweats, weight gain or weight loss Psychological ROS: negative for - behavioral disorder, hallucinations, memory difficulties, mood swings or suicidal ideation Ophthalmic ROS: negative for - blurry vision, double vision, eye pain or loss of vision ENT ROS: negative for - epistaxis, nasal discharge, oral lesions, sore throat, tinnitus or vertigo Allergy and Immunology ROS: negative for - hives or itchy/watery eyes Hematological and Lymphatic ROS: negative for - bleeding problems,  bruising or swollen lymph nodes Endocrine ROS: negative for - galactorrhea, hair pattern changes, polydipsia/polyuria or temperature intolerance Respiratory ROS: negative for - cough, hemoptysis, shortness of breath or wheezing Cardiovascular ROS: negative for - chest pain, dyspnea on exertion, edema or irregular heartbeat Gastrointestinal ROS: negative for - abdominal pain, diarrhea, hematemesis, nausea/vomiting or stool incontinence Genito-Urinary ROS: negative for - dysuria, hematuria, incontinence or urinary frequency/urgency Musculoskeletal ROS: negative for - joint swelling or muscular weakness Neurological ROS: as noted in HPI, baseline right sided weakness Dermatological ROS: negative for rash and skin lesion changes  Physical Examination: Blood pressure (!) 117/56, pulse 90, temperature 99.1 F (37.3 C), temperature source Oral, resp. rate 18, height 6' (1.829 m), weight 75.3 kg (166 lb), SpO2 97 %.  HEENT-  Craniotomy deformation of left skull.  Normal external eye and conjunctiva.  Normal TM's bilaterally.  Normal auditory canals and external ears. Normal external nose, mucus membranes and septum.  Normal pharynx. Cardiovascular- S1, S2 normal, pulses palpable throughout   Lungs- chest clear, no wheezing, rales, normal symmetric air entry Abdomen- soft, non-tender; bowel sounds normal; no masses,  no organomegaly Extremities- mild LE edema Lymph-no adenopathy palpable Musculoskeletal-no joint tenderness, deformity or swelling Skin-warm and dry, no hyperpigmentation, vitiligo, or suspicious lesions  Neurological Examination Mental Status: Alert, oriented, thought content appropriate.  Speech fluent without evidence of aphasia.  Mild dysarthria.  Able to follow 3 step commands without difficulty. Cranial Nerves: II: Discs flat bilaterally; Visual fields grossly normal, pupils equal, round, reactive to light and accommodation III,IV, VI: ptosis not present, extra-ocular motions  intact bilaterally V,VII: mild right facial droop, facial light touch sensation decreased on the right VIII: hearing normal bilaterally IX,X: gag reflex present XI: bilateral shoulder shrug XII: tongue deviation to the right Motor: Left 5/5.  Right hemiparesis with increased tone on the right and posturing of the RUE Sensory: Pinprick and light touch decreased on the right Deep Tendon Reflexes: 2+ in the upper extremities and absent in the lower extremities Plantars: Right: upgoing   Left: upgoing Cerebellar: Normal finger-to-nose testing.  Dysmetria with heel-to-shin testing using the RLE Gait: not tested due to safety concerns    Laboratory Studies:  Basic Metabolic Panel:  Recent Labs Lab 06/16/16 0853  NA 137  K 3.5  CL 101  CO2 27  GLUCOSE 178*  BUN 16  CREATININE 0.82  CALCIUM 9.9    Liver Function Tests:  Recent Labs Lab 06/16/16 0853  AST 25  ALT 15*  ALKPHOS 93  BILITOT 1.1  PROT 7.4  ALBUMIN 4.0  No results for input(s): LIPASE, AMYLASE in the last 168 hours. No results for input(s): AMMONIA in the last 168 hours.  CBC:  Recent Labs Lab 06/16/16 0853  WBC 6.5  NEUTROABS 4.6  HGB 15.2  HCT 42.9  MCV 97.0  PLT 381    Cardiac Enzymes:  Recent Labs Lab 06/16/16 0853  TROPONINI <0.03    BNP: Invalid input(s): POCBNP  CBG: No results for input(s): GLUCAP in the last 168 hours.  Microbiology: No results found for this or any previous visit.  Coagulation Studies: No results for input(s): LABPROT, INR in the last 72 hours.  Urinalysis:  Recent Labs Lab 06/16/16 1432  COLORURINE YELLOW*  LABSPEC 1.017  PHURINE 7.0  GLUCOSEU NEGATIVE  HGBUR NEGATIVE  BILIRUBINUR NEGATIVE  KETONESUR 5*  PROTEINUR NEGATIVE  NITRITE NEGATIVE  LEUKOCYTESUR TRACE*    Lipid Panel:    Component Value Date/Time   CHOL 178 06/17/2016 0435   TRIG 44 06/17/2016 0435   HDL 65 06/17/2016 0435   CHOLHDL 2.7 06/17/2016 0435   VLDL 9 06/17/2016  0435   LDLCALC 104 (H) 06/17/2016 0435    HgbA1C: No results found for: HGBA1C  Urine Drug Screen:  No results found for: LABOPIA, COCAINSCRNUR, LABBENZ, AMPHETMU, THCU, LABBARB  Alcohol Level: No results for input(s): ETH in the last 168 hours.  Other results: EKG: sinus rhythm at 76 bpm.  Imaging: Ct Head Wo Contrast  Result Date: 06/16/2016 CLINICAL DATA:  Altered mental status EXAM: CT HEAD WITHOUT CONTRAST TECHNIQUE: Contiguous axial images were obtained from the base of the skull through the vertex without intravenous contrast. COMPARISON:  None. FINDINGS: Brain: No evidence of acute infarction, hemorrhage, extra-axial collection, ventriculomegaly, or mass effect. Large area of encephalomalacia involving the left MCA territory. Generalized cerebral atrophy. Periventricular white matter low attenuation likely secondary to microangiopathy. Vascular: Cerebrovascular atherosclerotic calcifications are noted. Skull: No acute osseous abnormality. Prior left frontoparietal craniotomy. Sinuses/Orbits: Visualized portions of the orbits are unremarkable. Visualized portions of the paranasal sinuses and mastoid air cells are unremarkable. Other: None. IMPRESSION: 1. No acute intracranial pathology. 2. 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 Electronically Signed   By: Kathreen Devoid   On: 06/16/2016 09:26   Mr Angiogram Head Wo Contrast  Result Date: 06/16/2016 CLINICAL DATA:  Ataxia.  History of seizures and brain surgery. EXAM: MRI HEAD WITHOUT CONTRAST MRA HEAD WITHOUT CONTRAST TECHNIQUE: Multiplanar, multiecho pulse sequences of the brain and surrounding structures were obtained without intravenous contrast. Angiographic images of the head were obtained using MRA technique without contrast. COMPARISON:  Head CT 06/16/2016 FINDINGS: MRI HEAD FINDINGS Brain: Left-sided craniotomy changes are again seen with associated susceptibility artifact. There is extensive cystic encephalomalacia  throughout the left cerebral hemisphere involving much of the temporal, parietal, and frontal lobes as well as basal ganglia and thalamus. There is marked ex vacuo dilatation of the left lateral ventricle with mild surrounding gliosis in the remaining brain parenchyma. Left-sided corticospinal tract wallerian degeneration is noted in the brainstem. A few punctate foci of T2 hyperintensity in the white matter of the right cerebral hemisphere are within normal limits for age. Thin CSF signal intensity fluid collection along the right aspect of the interhemispheric fissure towards the vertex measures up to 6 mm in thickness, consistent with a subdural hygroma and without associated mass effect. No focal cerebellar abnormality. Limited assessment of the seventh and eighth cranial nerve complexes on this nondedicated study is unremarkable. Vascular: Nonvisualization of the left MCA, more fully evaluated  below. Skull and upper cervical spine: Left-sided craniotomy changes. No focal marrow lesion. Sinuses/Orbits: Unremarkable orbits. Paranasal sinuses and mastoid air cells are clear. Other: None. MRA HEAD FINDINGS The visualized distal vertebral arteries are patent to the basilar with the right being mildly dominant. Left PICA and right AICA appear dominant. SCA origins are patent. Basilar artery is widely patent. There are small posterior communicating arteries bilaterally. PCAs are patent with asymmetric attenuation of the P2 and more distal segments on the left but no evidence of significant focal proximal stenosis. The internal carotid arteries are patent from skullbase to carotid termini without evidence of significant stenosis. The right A1 segment is hypoplastic. Left A1 segment is widely patent. Right MCA is patent without evidence of significant proximal stenosis. 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 No intracranial aneurysm is identified. IMPRESSION: 1. No acute intracranial abnormality. 2. Extensive left  cerebral hemispheric encephalomalacia. 3. Occluded left MCA. Electronically Signed   By: Logan Bores M.D.   On: 06/16/2016 12:43   Mr Brain Wo Contrast  Result Date: 06/16/2016 CLINICAL DATA:  Ataxia.  History of seizures and brain surgery. EXAM: MRI HEAD WITHOUT CONTRAST MRA HEAD WITHOUT CONTRAST TECHNIQUE: Multiplanar, multiecho pulse sequences of the brain and surrounding structures were obtained without intravenous contrast. Angiographic images of the head were obtained using MRA technique without contrast. COMPARISON:  Head CT 06/16/2016 FINDINGS: MRI HEAD FINDINGS Brain: Left-sided craniotomy changes are again seen with associated susceptibility artifact. There is extensive cystic encephalomalacia throughout the left cerebral hemisphere involving much of the temporal, parietal, and frontal lobes as well as basal ganglia and thalamus. There is marked ex vacuo dilatation of the left lateral ventricle with mild surrounding gliosis in the remaining brain parenchyma. Left-sided corticospinal tract wallerian degeneration is noted in the brainstem. A few punctate foci of T2 hyperintensity in the white matter of the right cerebral hemisphere are within normal limits for age. Thin CSF signal intensity fluid collection along the right aspect of the interhemispheric fissure towards the vertex measures up to 6 mm in thickness, consistent with a subdural hygroma and without associated mass effect. No focal cerebellar abnormality. Limited assessment of the seventh and eighth cranial nerve complexes on this nondedicated study is unremarkable. Vascular: Nonvisualization of the left MCA, more fully evaluated below. Skull and upper cervical spine: Left-sided craniotomy changes. No focal marrow lesion. Sinuses/Orbits: Unremarkable orbits. Paranasal sinuses and mastoid air cells are clear. Other: None. MRA HEAD FINDINGS The visualized distal vertebral arteries are patent to the basilar with the right being mildly dominant.  Left PICA and right AICA appear dominant. SCA origins are patent. Basilar artery is widely patent. There are small posterior communicating arteries bilaterally. PCAs are patent with asymmetric attenuation of the P2 and more distal segments on the left but no evidence of significant focal proximal stenosis. The internal carotid arteries are patent from skullbase to carotid termini without evidence of significant stenosis. The right A1 segment is hypoplastic. Left A1 segment is widely patent. Right MCA is patent without evidence of significant proximal stenosis. 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 No intracranial aneurysm is identified. IMPRESSION: 1. No acute intracranial abnormality. 2. Extensive left cerebral hemispheric encephalomalacia. 3. Occluded left MCA. Electronically Signed   By: Logan Bores M.D.   On: 06/16/2016 12:43   US Carotid Bilateral (at [facility] Only)  Result Date: 06/16/2016 CLINICAL DATA:  Ataxia EXAM: BILATERAL CAROTID DUPLEX ULTRASOUND TECHNIQUE: Pearline Cables scale imaging, color Doppler and duplex ultrasound were  performed of bilateral carotid and vertebral arteries in the neck. COMPARISON:  None. FINDINGS: Criteria: Quantification of carotid stenosis is based on velocity parameters that correlate the residual internal carotid diameter with NASCET-based stenosis levels, using the diameter of the distal internal carotid lumen as the denominator for stenosis measurement. The following velocity measurements were obtained: RIGHT ICA:  114/26 cm/sec CCA:  [id number] cm/sec SYSTOLIC ICA/CCA RATIO:  0.8 DIASTOLIC ICA/CCA RATIO:  1.3 ECA:  105 cm/sec LEFT ICA:  91/23 cm/sec CCA:  [id number] cm/sec SYSTOLIC ICA/CCA RATIO:  0.7 DIASTOLIC ICA/CCA RATIO:  1.2 ECA:  95 cm/sec RIGHT CAROTID ARTERY: Grayscale images show no significant atherosclerotic plaque formation. The waveforms, velocities and flow velocity ratios show no evidence of focal hemodynamically significant stenosis. RIGHT VERTEBRAL ARTERY:   Antegrade in nature. LEFT CAROTID ARTERY: Grayscale images show no significant atherosclerotic plaque formation. The waveforms, velocities and flow velocity ratios show no evidence of focal hemodynamically significant stenosis LEFT VERTEBRAL ARTERY:  Antegrade in nature. IMPRESSION: No evidence of significant plaque formation or focal hemodynamically significant stenosis. Electronically Signed   By: Inez Catalina M.D.   On: 06/16/2016 13:57    Assessment: 71 y.o. male with a history of seizures with new onset dizziness.  MRI of the brain reviewed and shows no acute changes.  Concern is for medication side effects.  Patient reports was recently told that his Keppra level was high.  I do not have access to that lab result.  Was repeated at [facility] and this level is not yet available.  Dizziness is a common side effect of Keppra.  Dilantin level therapeutic at 11.8.  Stroke Risk Factors - none  Plan: 1. Would follow up Keppra levels.  In the meantime would decrease Keppra to 750mg  BID 2. PT evaluation    Alexis Goodell, MD Neurology [phone] 06/17/2016, 11:18 AM

## 2016-06-17 NOTE — Discharge Instructions (Signed)
Ataxia Introduction Ataxia is a condition that results in unsteadiness when walking and standing, poor coordination of body movements, and difficulty maintaining an upright posture. It occurs due to a problem with the part of your brain that controls coordination and stability (cerebellar dysfunction). What are the causes? Ataxia can develop later in life (acquired ataxia) during your 20s to 30s, and even as late as into your 60s or beyond. Acquired ataxia may be caused by:  Changes in your nervous system (neurodegenerative).  Changes throughout your body (systemic disorders).  Excess exposure to:  Medicines, such as phenytoin and lithium.  Solvents.  Abuse of alcohol (alcoholism).  Medical conditions, such as:  Celiac sprue.  Hypothyroidism.  Vitamin E deficiency.  Structural brain abnormalities, such as tumors.  Multiple sclerosis.  Stroke.  Head injury. Ataxia may also be present early in life (non-acquired ataxia). There are two main types of non-acquired ataxia:  Cerebellar dysfunction present at birth (congenital).  Family inheritance (genetic heredity). Friedreich ataxia is the most common form of hereditary ataxia. What are the signs or symptoms? The signs and symptoms of ataxia can vary depending on how severe the condition is that causes it. Signs and symptoms may include:  Unsteadiness.  Walking with a wide stance.  Tremor.  Poorly coordinated body movements.  Difficulty maintaining a straight (upright) posture.  Fatigue.  Changes in your speech.  Changes in your vision.  Difficulty swallowing.  Difficulty with writing.  Decreased mental status (dementia).  Muscle spasms. How is this diagnosed? Ataxia is diagnosed by discussing your personal and family history and through a physical exam. You may also have additional tests such as:  MRI.  Genetic testing. How is this treated? Treatment for ataxia may include treating or removing the  underlying condition causing the ataxia. Surgery may be required if a structural abnormality in your brain is causing the ataxia. Otherwise, supportive treatments may be used to manage your symptoms. Follow these instructions at home: Monitor your ataxia for any changes. The following actions may help any discomfort you are experiencing:  Do not drink alcohol.  Lie down right away if you become very unsteady, dizzy, nauseated, or feel like you are going to faint. Wait until all of these feelings pass before you get up again. Get help right away if:  Your unsteadiness suddenly worsens.  You develop severe headaches, chest pain, or abdominal pain.  You have weakness or numbness on one side of your body.  You have problems with your vision.  You feel confused.  You have difficulty speaking.  You have an irregular heartbeat or a very fast pulse. This information is not intended to replace advice given to you by your health care provider. Make sure you discuss any questions you have with your health care provider. Document Released: 12/25/2013 Document Revised: 11/05/2015 Document Reviewed: 08/30/2013  2017 [employer]vier

## 2016-06-17 NOTE — Care Management Obs Status (Signed)
[place] NOTIFICATION   Patient Details  Name: Dylan Sullivan MRN: [id number] Date of Birth: 1946-03-04   Medicare Observation Status Notification Given:  Yes    Shelbie Ammons, RN 06/17/2016, 8:45 AM

## 2016-06-17 NOTE — Evaluation (Signed)
Physical Therapy Evaluation Patient Details Name: Dylan Sullivan MRN: [id number] DOB: 24-Jan-1946 Today's Date: 06/17/2016   History of Present Illness  Pt is a 71 y/o M who presented with dizziness and unsteady gait. MRI brain with no acute intracranial abnormality.  Pt's PMH includes CP with R sided weakness, seizure disorder.    Clinical Impression  Pt admitted with above diagnosis. Pt currently with functional limitations due to the deficits listed below (see PT Problem List). Dylan Sullivan presents with dizziness that worsens with quick positional changes and with upright posture.  He requires close min guard assist for bed mobility, ambulation, and stair navigation.  He was Ind with all mobility PTA and lives alone.  Pt will need strict 24/7 supervision at d/c if he is to return home.  Vestibular evaluation completed, please see general comments below for details. Pt provided with gaze stabilization exercise; however, do not suspect this impairment is cause of pt's sudden onset dizziness. Pt will benefit from skilled PT to increase their independence and safety with mobility to allow discharge to the venue listed below.      Follow Up Recommendations Outpatient PT    Equipment Recommendations  None recommended by PT    Recommendations for Other Services       Precautions / Restrictions Precautions Precautions: Fall Restrictions Weight Bearing Restrictions: No      Mobility  Bed Mobility Overal bed mobility: Modified Independent             General bed mobility comments: Pt reports dizziness with moving supine>sit quickly  Transfers Overall transfer level: Needs assistance Equipment used: None Transfers: Sit to/from Stand Sit to Stand: Min guard         General transfer comment: Close min guard as pt unsteady and reports minimal dizziness.    Ambulation/Gait Ambulation/Gait assistance: Min guard Ambulation Distance (Feet): 275 Feet Assistive device: None Gait  Pattern/deviations: Step-through pattern;Staggering left;Staggering right   Gait velocity interpretation: at or above normal speed for age/gender General Gait Details: Initially in room pt reports dizziness and reaches out for doorframe.  Pt reports dizziness cleared within 10 seconds.  Pt unsteady and at times staggering L and R but able to steady without outisde assist.   Stairs Stairs: Yes Stairs assistance: Min guard Stair Management: One rail Left;One rail Right;Alternating pattern;Forwards Number of Stairs: 2 General stair comments: Pt holding onto rail on L up and down (pt reports he holds onto his door when entering his home).  Unsteady and close min guard provided.  Wheelchair Mobility    Modified Rankin (Stroke Patients Only)       Balance Overall balance assessment: Needs assistance Sitting-balance support: Single extremity supported;Feet supported Sitting balance-Leahy Scale: Fair Sitting balance - Comments: Initially pt requiring 1 UE support with supine>sit transition but once steady he is able to sit with good balance without UE support.   Standing balance support: No upper extremity supported;During functional activity Standing balance-Leahy Scale: Fair                               Pertinent Vitals/Pain Pain Assessment: No/denies pain    Home Living Family/patient expects to be discharged to:: Private residence Living Arrangements: Alone Available Help at Discharge: Friend(s);Neighbor;Available PRN/intermittently Type of Home: House Home Access: Stairs to enter Entrance Stairs-Rails: Right Entrance Stairs-Number of Steps: 2 Home Layout: One level Home Equipment: Grab bars - tub/shower      Prior Function Level  of Independence: Independent               Hand Dominance        Extremity/Trunk Assessment   Upper Extremity Assessment Upper Extremity Assessment: RUE deficits/detail RUE Deficits / Details: h/o CP with RUE resting in  elbow flexion and wrist flexion    Lower Extremity Assessment Lower Extremity Assessment: RLE deficits/detail RLE Deficits / Details: Strength grossly WFL, impaired coordination with h/o CP RLE Coordination: decreased gross motor       Communication   Communication: No difficulties  Cognition Arousal/Alertness: Awake/alert Behavior During Therapy: WFL for tasks assessed/performed Overall Cognitive Status: No family/caregiver present to determine baseline cognitive functioning Area of Impairment: Problem solving             Problem Solving: Slow processing General Comments: Pt with slower response time to questions and with difficulty explaining things to this therapist.    General Comments General comments (skin integrity, edema, etc.): Pt reports that his dizziness may be from his Keppra medication.  He saw his MD within the past few weeks who noticed a higher level of Keppra in his bloodwork than expected and per pt the MD suggested this could cause dizziness.  Completed vestibular evaluation with results as follows: Head shaking VOR, saccadic movements, Dix-hallpike L and R all negative.  Minor catch up with smooth pursuits in all quadrants. VOR cancellation with positive nystagmus to R at end range to R and nystagmus to L at end range to L. With VOR pt with minor catch ups and reports min dizziness.  Provided pt with gaze stabilizing exercise holding object at arms length and performing VOR 10x each side 3x/day.  Pt reports he wears glasses at all times at baseline but does not have them here in the hospital.  He denies any h/o vertigo in the past.  Onset when pt woke in the morning two days prior and has improved significantly since.  Dizziness worse in upright position as opposed to supine.  Light and dark does not change symptoms.  No recent trauma or falls. Pt denies any falls in his past.    Exercises Other Exercises Other Exercises: Gaze stabilization exercises as documented  in general comments.   Assessment/Plan    PT Assessment Patient needs continued PT services  PT Problem List Decreased balance;Decreased safety awareness          PT Treatment Interventions DME instruction;Gait training;Stair training;Therapeutic activities;Therapeutic exercise;Balance training;Patient/family education    PT Goals (Current goals can be found in the Care Plan section)  Acute Rehab PT Goals Patient Stated Goal: decreased dizziness and to go home PT Goal Formulation: With patient Time For Goal Achievement: 07/01/16 Potential to Achieve Goals: Good    Frequency Min 2X/week   Barriers to discharge Decreased caregiver support Limited assist available at home    Co-evaluation               End of Session Equipment Utilized During Treatment: Gait belt Activity Tolerance: Patient tolerated treatment well;Treatment limited secondary to medical complications (Comment) (dizziness) Patient left: in bed;with call bell/phone within reach;with bed alarm set Nurse Communication: Mobility status    Functional Assessment Tool Used: Clinical Judgement Functional Limitation: Mobility: Walking and moving around Mobility: Walking and Moving Around Current Status [id number]): At least 1 percent but less than 20 percent impaired, limited or restricted Mobility: Walking and Moving Around Goal Status [phone]): At least 1 percent but less than 20 percent impaired, limited or restricted  Time: 1128-1200 PT Time Calculation (min) (ACUTE ONLY): 32 min   Charges:   PT Evaluation $PT Eval Moderate Complexity: 1 Procedure PT Treatments $Gait Training: 8-22 mins   PT G Codes:   PT G-Codes **NOT FOR INPATIENT CLASS** Functional Assessment Tool Used: Clinical Judgement Functional Limitation: Mobility: Walking and moving around Mobility: Walking and Moving Around Current Status [id number]): At least 1 percent but less than 20 percent impaired, limited or restricted Mobility: Walking  and Moving Around Goal Status [phone]): At least 1 percent but less than 20 percent impaired, limited or restricted    Collie Siad PT, DPT 06/17/2016, 1:10 PM

## 2016-06-17 NOTE — Progress Notes (Signed)
Pt sat up in chair for breakfast and then walked 1+ assist around nurses station. Pt states he  Is better than yesterday. And is still wobbly and dizzy feeling.  Dr Posey Pronto notified. DC cancelled. Meclizine increased and PT ordered

## 2016-06-18 DIAGNOSIS — R270 Ataxia, unspecified: Secondary | ICD-10-CM

## 2016-06-18 DIAGNOSIS — R42 Dizziness and giddiness: Secondary | ICD-10-CM | POA: Diagnosis not present

## 2016-06-18 LAB — HEMOGLOBIN A1C
Hgb A1c MFr Bld: 5.2 % (ref 4.8–5.6)
Mean Plasma Glucose: 103 mg/dL

## 2016-06-18 LAB — LEVETIRACETAM LEVEL: Levetiracetam Lvl: 14.3 ug/mL (ref 10.0–40.0)

## 2016-06-18 MED ORDER — LEVETIRACETAM 750 MG PO TABS: 750.0000 mg | ORAL_TABLET | Freq: Two times a day (BID) | ORAL | 0 refills | Status: AC

## 2016-06-18 NOTE — Progress Notes (Signed)
[facility] NAME: Dylan Sullivan    MR#:  [id number]  DATE OF BIRTH:  11/17/1945  SUBJECTIVE:  Came in with dizziness No focal weakness has chronic right UE hemiplegia  REVIEW OF SYSTEMS:   Review of Systems  Constitutional: Negative for chills, fever and weight loss.  HENT: Negative for ear discharge, ear pain and nosebleeds.   Eyes: Negative for blurred vision, pain and discharge.  Respiratory: Negative for sputum production, shortness of breath, wheezing and stridor.   Cardiovascular: Negative for chest pain, palpitations, orthopnea and PND.  Gastrointestinal: Negative for abdominal pain, diarrhea, nausea and vomiting.  Genitourinary: Negative for frequency and urgency.  Musculoskeletal: Negative for back pain and joint pain.  Neurological: Positive for dizziness and weakness. Negative for sensory change, speech change and focal weakness.  Psychiatric/Behavioral: Negative for depression and hallucinations. The patient is not nervous/anxious.    Tolerating Diet:yes Tolerating PT: pending  DRUG ALLERGIES:  No Known Allergies  VITALS:  Blood pressure 118/65, pulse 77, temperature 97.9 F (36.6 C), temperature source Oral, resp. rate 19, height 6' (1.829 m), weight 75.3 kg (166 lb), SpO2 99 %.  PHYSICAL EXAMINATION:   Physical Exam  GENERAL:  71 y.o.-year-old patient lying in the bed with no acute distress.  EYES: Pupils equal, round, reactive to light and accommodation. No scleral icterus. Extraocular muscles intact.  HEENT: Head atraumatic, normocephalic. Oropharynx and nasopharynx clear.  NECK:  Supple, no jugular venous distention. No thyroid enlargement, no tenderness.  LUNGS: Normal breath sounds bilaterally, no wheezing, rales, rhonchi. No use of accessory muscles of respiration.  CARDIOVASCULAR: S1, S2 normal. No murmurs, rubs, or gallops.  ABDOMEN: Soft, nontender, nondistended. Bowel sounds present. No  organomegaly or mass.  EXTREMITIES: No cyanosis, clubbing or edema b/l.    NEUROLOGIC: Cranial nerves II through XII are intact. No focal Motor or sensory deficits b/l.  Chronic right UE hemiplegia PSYCHIATRIC:  patient is alert and oriented x 3.  SKIN: No obvious rash, lesion, or ulcer.   LABORATORY PANEL:  CBC  Recent Labs Lab 06/16/16 0853  WBC 6.5  HGB 15.2  HCT 42.9  PLT 381    Chemistries   Recent Labs Lab 06/16/16 0853  NA 137  K 3.5  CL 101  CO2 27  GLUCOSE 178*  BUN 16  CREATININE 0.82  CALCIUM 9.9  AST 25  ALT 15*  ALKPHOS 93  BILITOT 1.1   Cardiac Enzymes  Recent Labs Lab 06/16/16 0853  TROPONINI <0.03   RADIOLOGY:  Ct Head Wo Contrast  Result Date: 06/16/2016 CLINICAL DATA:  Altered mental status EXAM: CT HEAD WITHOUT CONTRAST TECHNIQUE: Contiguous axial images were obtained from the base of the skull through the vertex without intravenous contrast. COMPARISON:  None. FINDINGS: Brain: No evidence of acute infarction, hemorrhage, extra-axial collection, ventriculomegaly, or mass effect. Large area of encephalomalacia involving the left MCA territory. Generalized cerebral atrophy. Periventricular white matter low attenuation likely secondary to microangiopathy. Vascular: Cerebrovascular atherosclerotic calcifications are noted. Skull: No acute osseous abnormality. Prior left frontoparietal craniotomy. Sinuses/Orbits: Visualized portions of the orbits are unremarkable. Visualized portions of the paranasal sinuses and mastoid air cells are unremarkable. Other: None. IMPRESSION: 1. No acute intracranial pathology. 2. 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 Electronically Signed   By: Kathreen Devoid   On: 06/16/2016 09:26   Mr Angiogram Head Wo Contrast  Result Date: 06/16/2016 CLINICAL DATA:  Ataxia.  History of seizures and brain surgery. EXAM:  MRI HEAD WITHOUT CONTRAST MRA HEAD WITHOUT CONTRAST TECHNIQUE: Multiplanar, multiecho pulse sequences of  the brain and surrounding structures were obtained without intravenous contrast. Angiographic images of the head were obtained using MRA technique without contrast. COMPARISON:  Head CT 06/16/2016 FINDINGS: MRI HEAD FINDINGS Brain: Left-sided craniotomy changes are again seen with associated susceptibility artifact. There is extensive cystic encephalomalacia throughout the left cerebral hemisphere involving much of the temporal, parietal, and frontal lobes as well as basal ganglia and thalamus. There is marked ex vacuo dilatation of the left lateral ventricle with mild surrounding gliosis in the remaining brain parenchyma. Left-sided corticospinal tract wallerian degeneration is noted in the brainstem. A few punctate foci of T2 hyperintensity in the white matter of the right cerebral hemisphere are within normal limits for age. Thin CSF signal intensity fluid collection along the right aspect of the interhemispheric fissure towards the vertex measures up to 6 mm in thickness, consistent with a subdural hygroma and without associated mass effect. No focal cerebellar abnormality. Limited assessment of the seventh and eighth cranial nerve complexes on this nondedicated study is unremarkable. Vascular: Nonvisualization of the left MCA, more fully evaluated below. Skull and upper cervical spine: Left-sided craniotomy changes. No focal marrow lesion. Sinuses/Orbits: Unremarkable orbits. Paranasal sinuses and mastoid air cells are clear. Other: None. MRA HEAD FINDINGS The visualized distal vertebral arteries are patent to the basilar with the right being mildly dominant. Left PICA and right AICA appear dominant. SCA origins are patent. Basilar artery is widely patent. There are small posterior communicating arteries bilaterally. PCAs are patent with asymmetric attenuation of the P2 and more distal segments on the left but no evidence of significant focal proximal stenosis. The internal carotid arteries are patent from  skullbase to carotid termini without evidence of significant stenosis. The right A1 segment is hypoplastic. Left A1 segment is widely patent. Right MCA is patent without evidence of significant proximal stenosis. 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 No intracranial aneurysm is identified. IMPRESSION: 1. No acute intracranial abnormality. 2. Extensive left cerebral hemispheric encephalomalacia. 3. Occluded left MCA. Electronically Signed   By: Logan Bores M.D.   On: 06/16/2016 12:43   Mr Brain Wo Contrast  Result Date: 06/16/2016 CLINICAL DATA:  Ataxia.  History of seizures and brain surgery. EXAM: MRI HEAD WITHOUT CONTRAST MRA HEAD WITHOUT CONTRAST TECHNIQUE: Multiplanar, multiecho pulse sequences of the brain and surrounding structures were obtained without intravenous contrast. Angiographic images of the head were obtained using MRA technique without contrast. COMPARISON:  Head CT 06/16/2016 FINDINGS: MRI HEAD FINDINGS Brain: Left-sided craniotomy changes are again seen with associated susceptibility artifact. There is extensive cystic encephalomalacia throughout the left cerebral hemisphere involving much of the temporal, parietal, and frontal lobes as well as basal ganglia and thalamus. There is marked ex vacuo dilatation of the left lateral ventricle with mild surrounding gliosis in the remaining brain parenchyma. Left-sided corticospinal tract wallerian degeneration is noted in the brainstem. A few punctate foci of T2 hyperintensity in the white matter of the right cerebral hemisphere are within normal limits for age. Thin CSF signal intensity fluid collection along the right aspect of the interhemispheric fissure towards the vertex measures up to 6 mm in thickness, consistent with a subdural hygroma and without associated mass effect. No focal cerebellar abnormality. Limited assessment of the seventh and eighth cranial nerve complexes on this nondedicated study is unremarkable. Vascular:  Nonvisualization of the left MCA, more fully evaluated below. Skull and upper cervical spine: Left-sided craniotomy  changes. No focal marrow lesion. Sinuses/Orbits: Unremarkable orbits. Paranasal sinuses and mastoid air cells are clear. Other: None. MRA HEAD FINDINGS The visualized distal vertebral arteries are patent to the basilar with the right being mildly dominant. Left PICA and right AICA appear dominant. SCA origins are patent. Basilar artery is widely patent. There are small posterior communicating arteries bilaterally. PCAs are patent with asymmetric attenuation of the P2 and more distal segments on the left but no evidence of significant focal proximal stenosis. The internal carotid arteries are patent from skullbase to carotid termini without evidence of significant stenosis. The right A1 segment is hypoplastic. Left A1 segment is widely patent. Right MCA is patent without evidence of significant proximal stenosis. 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 No intracranial aneurysm is identified. IMPRESSION: 1. No acute intracranial abnormality. 2. Extensive left cerebral hemispheric encephalomalacia. 3. Occluded left MCA. Electronically Signed   By: Logan Bores M.D.   On: 06/16/2016 12:43   US Carotid Bilateral (at [facility] Only)  Result Date: 06/16/2016 CLINICAL DATA:  Ataxia EXAM: BILATERAL CAROTID DUPLEX ULTRASOUND TECHNIQUE: Pearline Cables scale imaging, color Doppler and duplex ultrasound were performed of bilateral carotid and vertebral arteries in the neck. COMPARISON:  None. FINDINGS: Criteria: Quantification of carotid stenosis is based on velocity parameters that correlate the residual internal carotid diameter with NASCET-based stenosis levels, using the diameter of the distal internal carotid lumen as the denominator for stenosis measurement. The following velocity measurements were obtained: RIGHT ICA:  114/26 cm/sec CCA:  [id number] cm/sec SYSTOLIC ICA/CCA RATIO:  0.8 DIASTOLIC ICA/CCA RATIO:  1.3  ECA:  105 cm/sec LEFT ICA:  91/23 cm/sec CCA:  [id number] cm/sec SYSTOLIC ICA/CCA RATIO:  0.7 DIASTOLIC ICA/CCA RATIO:  1.2 ECA:  95 cm/sec RIGHT CAROTID ARTERY: Grayscale images show no significant atherosclerotic plaque formation. The waveforms, velocities and flow velocity ratios show no evidence of focal hemodynamically significant stenosis. RIGHT VERTEBRAL ARTERY:  Antegrade in nature. LEFT CAROTID ARTERY: Grayscale images show no significant atherosclerotic plaque formation. The waveforms, velocities and flow velocity ratios show no evidence of focal hemodynamically significant stenosis LEFT VERTEBRAL ARTERY:  Antegrade in nature. IMPRESSION: No evidence of significant plaque formation or focal hemodynamically significant stenosis. Electronically Signed   By: Inez Catalina M.D.   On: 06/16/2016 13:57   ASSESSMENT AND PLAN:  RonaldTillmanis a 71 y.o.malewith a known history of Congenital right-sided weakness, history of seizure disorder after pain operation in 1960 followed by [facility] on seen because of dizziness, unstable gait since last night. Patient feels very dizzy, dizziness more when he sits up. Also has unstable gait  # dizziness since last night initial - CT head unremarkable  -MRI/ MRA showed encephalomalacia in the left side -no  acute stroke.  -Patient very unstable, dizzy---much improved today. No URI symptoms -on empiric meclizine - echocardiogram as out pt WNL - on meclizine for possible positional vertigo. -neurology consulted  #2 obstructive sleep apnea: Patient is on CPAP at night  #3 history of dense right hemiplegia-chronic  #4 history of partial seizures very well controlled continue Keppra, Dilantin - follows up with [facility]y at [facility] -just saw his [facility] few days ago as routine f/u  Case discussed with Care Management/Social Worker. Management plans discussed with the patient, family and they are in agreement.  CODE STATUS: full  DVT  Prophylaxis:lovenox  TOTAL TIME TAKING CARE OF THIS PATIENT: 30 minutes.  >50% time spent on counselling and coordination of care  POSSIBLE D/C IN1 DAYS, DEPENDING  ON CLINICAL CONDITION.  Note: This dictation was prepared with Dragon dictation along with smaller phrase technology. Any transcriptional errors that result from this process are unintentional.  Kodie Pick M.D  Between 7am to 6pm - Pager - [phone]  After 6pm go to [email or web address] - password [email or web address] [facility]  [facility]  Office  [phone]  CC: Primary care physician; Casilda Carls

## 2016-06-18 NOTE — Discharge Summary (Signed)
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 BPH (benign prostatic hyperplasia)   . Cerebral palsy (Dylan Sullivan)   . Elevated PSA   . Hematuria   . Hemiplegia ([place]) 09/13/2011   Overview:  Upper extremity most affected   . Hydronephrosis   . Kidney stone   . Nocturia   . Obstructive apnea 12/29/2014  . Osteoporosis   . 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 Right hemiplegia (Dylan Sullivan) 09/13/2011  . Seizures ([place])   . Sleep apnea   . Urinary retention     HOSPITAL COURSE:  Dylan Sullivan  is a 71 y.o. male with a known history of Congenital right-sided weakness, history of seizure disorder after pain operation in 1960 followed by [facility]  Neurologist on seen because of dizziness, unstable gait since last night. Patient feels very dizzy, dizziness more when he sits up. Also has unstable gait  # dizziness  - CT head unremarkable  -MRI/ MRA showed encephalomalacia in the left side -no  acute stroke.  -Patient very unstable, dizzy---much improved today. No URI symptoms - echocardiogram as out pt WNL -Neurology recommends decreased keppra to 750 mg bid -pt feels ok  #2 obstructive sleep apnea: Patient is on CPAP at night  #3 history of dense right hemiplegia-chronic  #4 history of partial seizures very well controlled continue Keppra, Dilantin - follows up with neurology at [facility] -just saw his [facility] few days ago as routine f/u  PT recommends out pt PT CONSULTS OBTAINED:  Treatment Team:  Dylan Goodell, MD  DRUG ALLERGIES:  No Known Allergies  DISCHARGE MEDICATIONS:   Current Discharge Medication List    START taking these medications   Details  meclizine (ANTIVERT) 12.5 MG tablet Take 1 tablet (12.5 mg total) by mouth 2 (two) times daily as needed for dizziness. Qty: 30 tablet, Refills: 0      CONTINUE these medications which have CHANGED   Details  levETIRAcetam (KEPPRA) 750 MG tablet Take 1 tablet (750 mg total) by mouth 2 (two) times daily. Qty: 60 tablet, Refills: 0      CONTINUE these medications which have NOT CHANGED   Details  acidophilus (RISAQUAD) CAPS capsule Take 1 capsule by mouth daily.    aspirin EC 81 MG tablet Take 81 mg by mouth daily.    cholecalciferol (VITAMIN D) 1000 units tablet Take 5,000 Units by mouth daily.    CRANBERRY PO Take 1 capsule by mouth daily.    finasteride (PROSCAR) 5 MG tablet Take 1 tablet (5 mg total) by mouth daily. Qty: 30 tablet, Refills: 12   Associated Diagnoses: Benign prostatic hyperplasia with lower urinary tract symptoms, symptom details unspecified    Flaxseed, Linseed, (FLAX SEED OIL PO) Take 1 capsule by mouth daily.    hydrochlorothiazide (HYDRODIURIL) 25 MG tablet Take 25 mg by mouth daily.    phenytoin (DILANTIN) 100 MG ER capsule Take 300 mg by mouth daily.    tamsulosin (FLOMAX) 0.4 MG CAPS capsule TAKE ONE CAPSULE BY MOUTH EVERY DAY Qty: 30 capsule,  Refills: 3   Associated Diagnoses: BPH (benign prostatic hyperplasia)    Turmeric 500 MG CAPS Take 1 capsule by mouth daily.    vitamin C (ASCORBIC ACID) 500 MG tablet Take 1,000 mg by mouth daily.        If you experience worsening of your admission symptoms, develop shortness of breath, life threatening emergency, suicidal or homicidal thoughts you must seek medical attention immediately by calling 911 or calling your MD immediately  if symptoms less severe.  You Must read complete instructions/literature along with all the possible adverse  reactions/side effects for all the Medicines you take and that have been prescribed to you. Take any new Medicines after you have completely understood and accept all the possible adverse reactions/side effects.   Please note  You were cared for by a hospitalist during your hospital stay. If you have any questions about your discharge medications or the care you received while you were in the hospital after you are discharged, you can call the unit and asked to speak with the hospitalist on call if the hospitalist that took care of you is not available. Once you are discharged, your primary care physician will handle any further medical issues. Please note that NO REFILLS for any discharge medications will be authorized once you are discharged, as it is imperative that you return to your primary care physician (or establish a relationship with a primary care physician if you do not have one) for your aftercare needs so that they can reassess your need for medications and monitor your lab values. Today   SUBJECTIVE   I feel better today  VITAL SIGNS:  Blood pressure 118/65, pulse 77, temperature 97.9 F (36.6 C), temperature source Oral, resp. rate 19, height 6' (1.829 m), weight 75.3 kg (166 lb), SpO2 99 %.  I/O:    Intake/Output Summary (Last 24 hours) at 06/18/16 [id number] Last data filed at 06/18/16 0600  Gross per 24 hour  Intake              840 ml  Output                0 ml  Net              840 ml    PHYSICAL EXAMINATION:  GENERAL:  71 y.o.-year-old patient lying in the bed with no acute distress.  EYES: Pupils equal, round, reactive to light and accommodation. No scleral icterus. Extraocular muscles intact.  HEENT: Head atraumatic, normocephalic. Oropharynx and nasopharynx clear.  NECK:  Supple, no jugular venous distention. No thyroid enlargement, no tenderness.  LUNGS: Normal breath sounds bilaterally, no wheezing, rales,rhonchi or crepitation. No use of accessory muscles of  respiration.  CARDIOVASCULAR: S1, S2 normal. No murmurs, rubs, or gallops.  ABDOMEN: Soft, non-tender, non-distended. Bowel sounds present. No organomegaly or mass.  EXTREMITIES: No pedal edema, cyanosis, or clubbing. Chronic right UE contracture NEUROLOGIC: Cranial nerves II through XII are intact. Muscle strength 5/5 in all extremities. Sensation intact. Gait not checked.  PSYCHIATRIC: The patient is alert and oriented x 3.  SKIN: No obvious rash, lesion, or ulcer.   DATA REVIEW:   CBC   Recent Labs Lab 06/16/16 0853  WBC 6.5  HGB 15.2  HCT 42.9  PLT 381    Chemistries   Recent Labs Lab 06/16/16 0853  NA 137  K 3.5  CL 101  CO2 27  GLUCOSE 178*  BUN 16  CREATININE 0.82  CALCIUM 9.9  AST 25  ALT 15*  ALKPHOS 93  BILITOT 1.1    Microbiology Results   No results found for this or any previous visit (from the past 240 hour(s)).  RADIOLOGY:  Ct Head Wo Contrast  Result Date: 06/16/2016 CLINICAL DATA:  Altered mental status EXAM: CT HEAD WITHOUT CONTRAST TECHNIQUE: Contiguous axial images were obtained from the base of the skull through the vertex without intravenous contrast. COMPARISON:  None. FINDINGS: Brain: No evidence of acute infarction, hemorrhage, extra-axial collection, ventriculomegaly, or mass effect. Large area of encephalomalacia involving the left MCA territory. Generalized cerebral atrophy. Periventricular white matter low attenuation likely secondary to microangiopathy. Vascular: Cerebrovascular atherosclerotic calcifications are noted. Skull: No acute osseous abnormality. Prior left frontoparietal craniotomy. Sinuses/Orbits: Visualized portions of the orbits are unremarkable. Visualized portions of the paranasal sinuses and mastoid air cells are unremarkable. Other: None. IMPRESSION: 1. No acute intracranial pathology. 2. 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 Electronically Signed   By: Kathreen Devoid   On: 06/16/2016 09:26   Mr Angiogram  Head Wo Contrast  Result Date: 06/16/2016 CLINICAL DATA:  Ataxia.  History of seizures and brain surgery. EXAM: MRI HEAD WITHOUT CONTRAST MRA HEAD WITHOUT CONTRAST TECHNIQUE: Multiplanar, multiecho pulse sequences of the brain and surrounding structures were obtained without intravenous contrast. Angiographic images of the head were obtained using MRA technique without contrast. COMPARISON:  Head CT 06/16/2016 FINDINGS: MRI HEAD FINDINGS Brain: Left-sided craniotomy changes are again seen with associated susceptibility artifact. There is extensive cystic encephalomalacia throughout the left cerebral hemisphere involving much of the temporal, parietal, and frontal lobes as well as basal ganglia and thalamus. There is marked ex vacuo dilatation of the left lateral ventricle with mild surrounding gliosis in the remaining brain parenchyma. Left-sided corticospinal tract wallerian degeneration is noted in the brainstem. A few punctate foci of T2 hyperintensity in the white matter of the right cerebral hemisphere are within normal limits for age. Thin CSF signal intensity fluid collection along the right aspect of the interhemispheric fissure towards the vertex measures up to 6 mm in thickness, consistent with a subdural hygroma and without associated mass effect. No focal cerebellar abnormality. Limited assessment of the seventh and eighth cranial nerve complexes on this nondedicated study is unremarkable. Vascular: Nonvisualization of the left MCA, more fully evaluated below. Skull and upper cervical spine: Left-sided craniotomy changes. No focal marrow lesion. Sinuses/Orbits: Unremarkable orbits. Paranasal sinuses and mastoid air cells are clear. Other: None. MRA HEAD FINDINGS The visualized distal vertebral arteries are patent to the basilar with the right being mildly dominant. Left PICA and right AICA appear dominant. SCA origins are patent. Basilar artery is widely patent. There are small posterior communicating  arteries bilaterally. PCAs are patent with asymmetric attenuation of the P2 and more distal segments on the left but no evidence of significant focal proximal stenosis. The internal carotid arteries are patent from skullbase to carotid termini without evidence of significant stenosis. The right A1 segment is hypoplastic. Left A1 segment is widely patent. Right MCA is patent without evidence of significant proximal stenosis. 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 No intracranial aneurysm is identified. IMPRESSION: 1. No acute intracranial abnormality. 2. Extensive left cerebral hemispheric encephalomalacia. 3. Occluded left MCA. Electronically Signed   By: Logan Bores M.D.   On: 06/16/2016 12:43   Mr Brain Wo Contrast  Result Date: 06/16/2016 CLINICAL DATA:  Ataxia.  History of seizures and brain surgery. EXAM: MRI HEAD WITHOUT CONTRAST MRA HEAD WITHOUT CONTRAST TECHNIQUE: Multiplanar, multiecho pulse  sequences of the brain and surrounding structures were obtained without intravenous contrast. Angiographic images of the head were obtained using MRA technique without contrast. COMPARISON:  Head CT 06/16/2016 FINDINGS: MRI HEAD FINDINGS Brain: Left-sided craniotomy changes are again seen with associated susceptibility artifact. There is extensive cystic encephalomalacia throughout the left cerebral hemisphere involving much of the temporal, parietal, and frontal lobes as well as basal ganglia and thalamus. There is marked ex vacuo dilatation of the left lateral ventricle with mild surrounding gliosis in the remaining brain parenchyma. Left-sided corticospinal tract wallerian degeneration is noted in the brainstem. A few punctate foci of T2 hyperintensity in the white matter of the right cerebral hemisphere are within normal limits for age. Thin CSF signal intensity fluid collection along the right aspect of the interhemispheric fissure towards the vertex measures up to 6 mm in thickness, consistent with a subdural  hygroma and without associated mass effect. No focal cerebellar abnormality. Limited assessment of the seventh and eighth cranial nerve complexes on this nondedicated study is unremarkable. Vascular: Nonvisualization of the left MCA, more fully evaluated below. Skull and upper cervical spine: Left-sided craniotomy changes. No focal marrow lesion. Sinuses/Orbits: Unremarkable orbits. Paranasal sinuses and mastoid air cells are clear. Other: None. MRA HEAD FINDINGS The visualized distal vertebral arteries are patent to the basilar with the right being mildly dominant. Left PICA and right AICA appear dominant. SCA origins are patent. Basilar artery is widely patent. There are small posterior communicating arteries bilaterally. PCAs are patent with asymmetric attenuation of the P2 and more distal segments on the left but no evidence of significant focal proximal stenosis. The internal carotid arteries are patent from skullbase to carotid termini without evidence of significant stenosis. The right A1 segment is hypoplastic. Left A1 segment is widely patent. Right MCA is patent without evidence of significant proximal stenosis. 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 No intracranial aneurysm is identified. IMPRESSION: 1. No acute intracranial abnormality. 2. Extensive left cerebral hemispheric encephalomalacia. 3. Occluded left MCA. Electronically Signed   By: Logan Bores M.D.   On: 06/16/2016 12:43   US Carotid Bilateral (at [facility] Only)  Result Date: 06/16/2016 CLINICAL DATA:  Ataxia EXAM: BILATERAL CAROTID DUPLEX ULTRASOUND TECHNIQUE: Pearline Cables scale imaging, color Doppler and duplex ultrasound were performed of bilateral carotid and vertebral arteries in the neck. COMPARISON:  None. FINDINGS: Criteria: Quantification of carotid stenosis is based on velocity parameters that correlate the residual internal carotid diameter with NASCET-based stenosis levels, using the diameter of the distal internal carotid lumen as  the denominator for stenosis measurement. The following velocity measurements were obtained: RIGHT ICA:  114/26 cm/sec CCA:  [id number] cm/sec SYSTOLIC ICA/CCA RATIO:  0.8 DIASTOLIC ICA/CCA RATIO:  1.3 ECA:  105 cm/sec LEFT ICA:  91/23 cm/sec CCA:  [id number] cm/sec SYSTOLIC ICA/CCA RATIO:  0.7 DIASTOLIC ICA/CCA RATIO:  1.2 ECA:  95 cm/sec RIGHT CAROTID ARTERY: Grayscale images show no significant atherosclerotic plaque formation. The waveforms, velocities and flow velocity ratios show no evidence of focal hemodynamically significant stenosis. RIGHT VERTEBRAL ARTERY:  Antegrade in nature. LEFT CAROTID ARTERY: Grayscale images show no significant atherosclerotic plaque formation. The waveforms, velocities and flow velocity ratios show no evidence of focal hemodynamically significant stenosis LEFT VERTEBRAL ARTERY:  Antegrade in nature. IMPRESSION: No evidence of significant plaque formation or focal hemodynamically significant stenosis. Electronically Signed   By: Inez Catalina M.D.   On: 06/16/2016 13:57     Management plans discussed with the patient, family and they  are in agreement.  CODE STATUS:     Code Status Orders        Start     Ordered   06/16/16 1125  Full code  Continuous     06/16/16 1127    Code Status History    Date Active Date Inactive Code Status Order ID Comments User Context   This patient has a current code status but no historical code status.      TOTAL TIME TAKING CARE OF THIS PATIENT: 40 minutes.    Versie Soave M.D on 06/18/2016 at 8:22 AM  Between 7am to 6pm - Pager - [phone] After 6pm go to [email or web address] - password [email or web address] [facility]  [facility]  Office  [phone]  CC: Primary care physician; Dylan Sullivan

## 2016-06-18 NOTE — Consult Note (Signed)
Referring Physician: Posey Pronto    Chief Complaint: Dizziness  No further dizzy spells  Past Medical History:  Diagnosis Date  . BPH (benign prostatic hyperplasia)   . Cerebral palsy ([place])   . Elevated PSA   . Hematuria   . Hemiplegia ([place]) 09/13/2011   Overview:  Upper extremity most affected   . Hydronephrosis   . Kidney stone   . Nocturia   . Obstructive apnea 12/29/2014  . Osteoporosis   . 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 Seizures ([place])   . Sleep apnea   . Urinary retention     Past Surgical History:  Procedure Laterality Date  . APPENDECTOMY    . arm surgery Right    child  . BRAIN SURGERY    . LEG SURGERY Right    child  . TONSILLECTOMY      Family History  Problem Relation Age of Onset  . Breast cancer Maternal Aunt   . Prostate cancer Neg Hx   . Bladder Cancer Neg Hx   . Kidney cancer Neg Hx    Social History:  reports that he has never smoked. He has never used smokeless tobacco. He reports that he does not drink alcohol or use drugs.  Allergies: No Known Allergies  Medications:  I have reviewed the patient's current medications. Prior to Admission:  Prescriptions Prior to Admission  Medication Sig Dispense Refill Last Dose  . acidophilus (RISAQUAD) CAPS capsule Take 1 capsule by mouth daily.   06/15/2016 at 0900  . aspirin EC 81 MG tablet Take 81 mg by mouth daily.   06/15/2016 at 0900  . cholecalciferol (VITAMIN D) 1000 units tablet Take 5,000 Units by mouth daily.   06/15/2016 at 0900  . CRANBERRY PO Take 1 capsule by mouth daily.   06/15/2016 at 0900  . finasteride (PROSCAR) 5 MG tablet Take 1 tablet (5 mg total) by mouth daily. 30 tablet 12 06/15/2016 at 0900  . Flaxseed, Linseed, (FLAX SEED OIL PO) Take 1 capsule by mouth daily.   06/15/2016 at 0900  . hydrochlorothiazide (HYDRODIURIL) 25 MG tablet Take 25 mg by mouth daily.   06/15/2016 at 0900  . phenytoin (DILANTIN) 100 MG ER capsule Take 300  mg by mouth daily.   06/15/2016 at [place]  . tamsulosin (FLOMAX) 0.4 MG CAPS capsule TAKE ONE CAPSULE BY MOUTH EVERY DAY 30 capsule 3 06/15/2016 at 0900  . Turmeric 500 MG CAPS Take 1 capsule by mouth daily.   06/15/2016 at 0900  . vitamin C (ASCORBIC ACID) 500 MG tablet Take 1,000 mg by mouth daily.   06/15/2016 at 0900  . [DISCONTINUED] levETIRAcetam (KEPPRA) 1000 MG tablet Take 1,000 mg by mouth 2 (two) times daily.   2 06/15/2016 at 1930   Scheduled: . aspirin EC  81 mg Oral Daily  . enoxaparin (LOVENOX) injection  40 mg Subcutaneous Q24H  . finasteride  5 mg Oral Daily  . levETIRAcetam  750 mg Oral BID  . meclizine  12.5 mg Oral TID  . phenytoin  300 mg Oral Daily  . tamsulosin  0.4 mg Oral Daily    ROS: History obtained from the patient  General ROS: negative for - chills, fatigue, fever, night sweats, weight gain or weight loss Psychological ROS: negative for - behavioral disorder, hallucinations, memory difficulties, mood swings or suicidal ideation Ophthalmic ROS: negative for - blurry vision, double vision, eye pain or loss of vision ENT ROS: negative for -  epistaxis, nasal discharge, oral lesions, sore throat, tinnitus or vertigo Allergy and Immunology ROS: negative for - hives or itchy/watery eyes Hematological and Lymphatic ROS: negative for - bleeding problems, bruising or swollen lymph nodes Endocrine ROS: negative for - galactorrhea, hair pattern changes, polydipsia/polyuria or temperature intolerance Respiratory ROS: negative for - cough, hemoptysis, shortness of breath or wheezing Cardiovascular ROS: negative for - chest pain, dyspnea on exertion, edema or irregular heartbeat Gastrointestinal ROS: negative for - abdominal pain, diarrhea, hematemesis, nausea/vomiting or stool incontinence Genito-Urinary ROS: negative for - dysuria, hematuria, incontinence or urinary frequency/urgency Musculoskeletal ROS: negative for - joint swelling or muscular weakness Neurological ROS: as noted  in HPI, baseline right sided weakness Dermatological ROS: negative for rash and skin lesion changes  Physical Examination: Blood pressure 118/65, pulse 77, temperature 97.9 F (36.6 C), temperature source Oral, resp. rate 19, height 6' (1.829 m), weight 75.3 kg (166 lb), SpO2 99 %.  HEENT-  Craniotomy deformation of left skull.  Normal external eye and conjunctiva.  Normal TM's bilaterally.  Normal auditory canals and external ears. Normal external nose, mucus membranes and septum.  Normal pharynx. Cardiovascular- S1, S2 normal, pulses palpable throughout   Lungs- chest clear, no wheezing, rales, normal symmetric air entry Abdomen- soft, non-tender; bowel sounds normal; no masses,  no organomegaly Extremities- mild LE edema Lymph-no adenopathy palpable Musculoskeletal-no joint tenderness, deformity or swelling Skin-warm and dry, no hyperpigmentation, vitiligo, or suspicious lesions  Neurological Examination Mental Status: Alert, oriented, thought content appropriate.  Speech fluent without evidence of aphasia.  Mild dysarthria.  Able to follow 3 step commands without difficulty. Cranial Nerves: II: Discs flat bilaterally; Visual fields grossly normal, pupils equal, round, reactive to light and accommodation III,IV, VI: ptosis not present, extra-ocular motions intact bilaterally V,VII: mild right facial droop, facial light touch sensation decreased on the right VIII: hearing normal bilaterally IX,X: gag reflex present XI: bilateral shoulder shrug XII: tongue deviation to the right Motor: Left 5/5.  Right hemiparesis with increased tone on the right and posturing of the RUE Sensory: Pinprick and light touch decreased on the right Deep Tendon Reflexes: 2+ in the upper extremities and absent in the lower extremities Plantars: Right: upgoing   Left: upgoing Cerebellar: Normal finger-to-nose testing.  Dysmetria with heel-to-shin testing using the RLE Gait: not tested due to safety  concerns    Laboratory Studies:  Basic Metabolic Panel:  Recent Labs Lab 06/16/16 0853  NA 137  K 3.5  CL 101  CO2 27  GLUCOSE 178*  BUN 16  CREATININE 0.82  CALCIUM 9.9    Liver Function Tests:  Recent Labs Lab 06/16/16 0853  AST 25  ALT 15*  ALKPHOS 93  BILITOT 1.1  PROT 7.4  ALBUMIN 4.0   No results for input(s): LIPASE, AMYLASE in the last 168 hours. No results for input(s): AMMONIA in the last 168 hours.  CBC:  Recent Labs Lab 06/16/16 0853  WBC 6.5  NEUTROABS 4.6  HGB 15.2  HCT 42.9  MCV 97.0  PLT 381    Cardiac Enzymes:  Recent Labs Lab 06/16/16 0853  TROPONINI <0.03    BNP: Invalid input(s): POCBNP  CBG: No results for input(s): GLUCAP in the last 168 hours.  Microbiology: No results found for this or any previous visit.  Coagulation Studies: No results for input(s): LABPROT, INR in the last 72 hours.  Urinalysis:   Recent Labs Lab 06/16/16 1432  COLORURINE YELLOW*  LABSPEC 1.017  PHURINE 7.0  GLUCOSEU NEGATIVE  HGBUR NEGATIVE  BILIRUBINUR NEGATIVE  KETONESUR 5*  PROTEINUR NEGATIVE  NITRITE NEGATIVE  LEUKOCYTESUR TRACE*    Lipid Panel:    Component Value Date/Time   CHOL 178 06/17/2016 0435   TRIG 44 06/17/2016 0435   HDL 65 06/17/2016 0435   CHOLHDL 2.7 06/17/2016 0435   VLDL 9 06/17/2016 0435   LDLCALC 104 (H) 06/17/2016 0435    HgbA1C:  Lab Results  Component Value Date   HGBA1C 5.2 06/17/2016    Urine Drug Screen:  No results found for: LABOPIA, COCAINSCRNUR, LABBENZ, AMPHETMU, THCU, LABBARB  Alcohol Level: No results for input(s): ETH in the last 168 hours.  Other results: EKG: sinus rhythm at 76 bpm.  Imaging: Mr Angiogram Head Wo Contrast  Result Date: 06/16/2016 CLINICAL DATA:  Ataxia.  History of seizures and brain surgery. EXAM: MRI HEAD WITHOUT CONTRAST MRA HEAD WITHOUT CONTRAST TECHNIQUE: Multiplanar, multiecho pulse sequences of the brain and surrounding structures were obtained without  intravenous contrast. Angiographic images of the head were obtained using MRA technique without contrast. COMPARISON:  Head CT 06/16/2016 FINDINGS: MRI HEAD FINDINGS Brain: Left-sided craniotomy changes are again seen with associated susceptibility artifact. There is extensive cystic encephalomalacia throughout the left cerebral hemisphere involving much of the temporal, parietal, and frontal lobes as well as basal ganglia and thalamus. There is marked ex vacuo dilatation of the left lateral ventricle with mild surrounding gliosis in the remaining brain parenchyma. Left-sided corticospinal tract wallerian degeneration is noted in the brainstem. A few punctate foci of T2 hyperintensity in the white matter of the right cerebral hemisphere are within normal limits for age. Thin CSF signal intensity fluid collection along the right aspect of the interhemispheric fissure towards the vertex measures up to 6 mm in thickness, consistent with a subdural hygroma and without associated mass effect. No focal cerebellar abnormality. Limited assessment of the seventh and eighth cranial nerve complexes on this nondedicated study is unremarkable. Vascular: Nonvisualization of the left MCA, more fully evaluated below. Skull and upper cervical spine: Left-sided craniotomy changes. No focal marrow lesion. Sinuses/Orbits: Unremarkable orbits. Paranasal sinuses and mastoid air cells are clear. Other: None. MRA HEAD FINDINGS The visualized distal vertebral arteries are patent to the basilar with the right being mildly dominant. Left PICA and right AICA appear dominant. SCA origins are patent. Basilar artery is widely patent. There are small posterior communicating arteries bilaterally. PCAs are patent with asymmetric attenuation of the P2 and more distal segments on the left but no evidence of significant focal proximal stenosis. The internal carotid arteries are patent from skullbase to carotid termini without evidence of significant  stenosis. The right A1 segment is hypoplastic. Left A1 segment is widely patent. Right MCA is patent without evidence of significant proximal stenosis. 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 No intracranial aneurysm is identified. IMPRESSION: 1. No acute intracranial abnormality. 2. Extensive left cerebral hemispheric encephalomalacia. 3. Occluded left MCA. Electronically Signed   By: Logan Bores M.D.   On: 06/16/2016 12:43   Mr Brain Wo Contrast  Result Date: 06/16/2016 CLINICAL DATA:  Ataxia.  History of seizures and brain surgery. EXAM: MRI HEAD WITHOUT CONTRAST MRA HEAD WITHOUT CONTRAST TECHNIQUE: Multiplanar, multiecho pulse sequences of the brain and surrounding structures were obtained without intravenous contrast. Angiographic images of the head were obtained using MRA technique without contrast. COMPARISON:  Head CT 06/16/2016 FINDINGS: MRI HEAD FINDINGS Brain: Left-sided craniotomy changes are again seen with associated susceptibility artifact. There is extensive cystic encephalomalacia throughout the left cerebral hemisphere  involving much of the temporal, parietal, and frontal lobes as well as basal ganglia and thalamus. There is marked ex vacuo dilatation of the left lateral ventricle with mild surrounding gliosis in the remaining brain parenchyma. Left-sided corticospinal tract wallerian degeneration is noted in the brainstem. A few punctate foci of T2 hyperintensity in the white matter of the right cerebral hemisphere are within normal limits for age. Thin CSF signal intensity fluid collection along the right aspect of the interhemispheric fissure towards the vertex measures up to 6 mm in thickness, consistent with a subdural hygroma and without associated mass effect. No focal cerebellar abnormality. Limited assessment of the seventh and eighth cranial nerve complexes on this nondedicated study is unremarkable. Vascular: Nonvisualization of the left MCA, more fully evaluated below. Skull and  upper cervical spine: Left-sided craniotomy changes. No focal marrow lesion. Sinuses/Orbits: Unremarkable orbits. Paranasal sinuses and mastoid air cells are clear. Other: None. MRA HEAD FINDINGS The visualized distal vertebral arteries are patent to the basilar with the right being mildly dominant. Left PICA and right AICA appear dominant. SCA origins are patent. Basilar artery is widely patent. There are small posterior communicating arteries bilaterally. PCAs are patent with asymmetric attenuation of the P2 and more distal segments on the left but no evidence of significant focal proximal stenosis. The internal carotid arteries are patent from skullbase to carotid termini without evidence of significant stenosis. The right A1 segment is hypoplastic. Left A1 segment is widely patent. Right MCA is patent without evidence of significant proximal stenosis. 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 No intracranial aneurysm is identified. IMPRESSION: 1. No acute intracranial abnormality. 2. Extensive left cerebral hemispheric encephalomalacia. 3. Occluded left MCA. Electronically Signed   By: Logan Bores M.D.   On: 06/16/2016 12:43   US Carotid Bilateral (at [facility] Only)  Result Date: 06/16/2016 CLINICAL DATA:  Ataxia EXAM: BILATERAL CAROTID DUPLEX ULTRASOUND TECHNIQUE: Pearline Cables scale imaging, color Doppler and duplex ultrasound were performed of bilateral carotid and vertebral arteries in the neck. COMPARISON:  None. FINDINGS: Criteria: Quantification of carotid stenosis is based on velocity parameters that correlate the residual internal carotid diameter with NASCET-based stenosis levels, using the diameter of the distal internal carotid lumen as the denominator for stenosis measurement. The following velocity measurements were obtained: RIGHT ICA:  114/26 cm/sec CCA:  [id number] cm/sec SYSTOLIC ICA/CCA RATIO:  0.8 DIASTOLIC ICA/CCA RATIO:  1.3 ECA:  105 cm/sec LEFT ICA:  91/23 cm/sec CCA:  [id number] cm/sec SYSTOLIC  ICA/CCA RATIO:  0.7 DIASTOLIC ICA/CCA RATIO:  1.2 ECA:  95 cm/sec RIGHT CAROTID ARTERY: Grayscale images show no significant atherosclerotic plaque formation. The waveforms, velocities and flow velocity ratios show no evidence of focal hemodynamically significant stenosis. RIGHT VERTEBRAL ARTERY:  Antegrade in nature. LEFT CAROTID ARTERY: Grayscale images show no significant atherosclerotic plaque formation. The waveforms, velocities and flow velocity ratios show no evidence of focal hemodynamically significant stenosis LEFT VERTEBRAL ARTERY:  Antegrade in nature. IMPRESSION: No evidence of significant plaque formation or focal hemodynamically significant stenosis. Electronically Signed   By: Inez Catalina M.D.   On: 06/16/2016 13:57    Assessment: 71 y.o. male with a history of seizures with new onset dizziness.  MRI of the brain reviewed and shows no acute changes.  Concern is for medication side effects.  Patient reports was recently told that his Keppra level was high.  I do not have access to that lab result.  Was repeated at [facility] and this level is not yet  available.  Dizziness is a common side effect of Keppra.  Dilantin level therapeutic at 11.8.  No further dizzy spells - con't Keppra 750 BID - out pt follow up at [facility].   - d/c planning

## 2016-06-18 NOTE — Care Management Note (Signed)
Case Management Note  Patient Details  Name: [name] MRN: [id number] Date of Birth: Oct 25, 1945  Subjective/Objective:  [name] can follow up with outpatient PT by either calling his PCP and requesting a referral or call the [facility] [phone] to request an appointment there.                   Action/Plan:   Expected Discharge Date:                  Expected Discharge Plan:     In-House Referral:     Discharge planning Services     Post Acute Care Choice:    Choice offered to:     DME Arranged:    DME Agency:     HH Arranged:    HH Agency:     Status of Service:     If discussed at [employer] of Stay Meetings, dates discussed:    Additional Comments:  Emmalena Canny A, RN 06/18/2016, 8:40 AM

## 2016-06-18 NOTE — Progress Notes (Signed)
Reports dizziness resolved and ready for discharge home. Oral and written AVS instructions completed. TC with Dylan Sullivan phamacist and verified new prescriptions were ready with pt updated. Pt also requested refill of his dilantin. Transported pt in [facility] to private vehicle with friend; released to self care at home.

## 2016-06-24 ENCOUNTER — Other Ambulatory Visit: Payer: Self-pay | Admitting: Urology

## 2016-06-24 DIAGNOSIS — N400 Enlarged prostate without lower urinary tract symptoms: Secondary | ICD-10-CM

## 2016-10-11 ENCOUNTER — Other Ambulatory Visit: Payer: Medicare Other

## 2016-10-11 ENCOUNTER — Other Ambulatory Visit: Payer: Self-pay

## 2016-10-11 DIAGNOSIS — N401 Enlarged prostate with lower urinary tract symptoms: Secondary | ICD-10-CM

## 2016-10-11 DIAGNOSIS — Z87898 Personal history of other specified conditions: Secondary | ICD-10-CM

## 2016-10-12 LAB — PSA: Prostate Specific Ag, Serum: 2.6 ng/mL (ref 0.0–4.0)

## 2016-10-17 NOTE — Progress Notes (Signed)
10:53 AM   Dylan Sullivan Oct 22, 1945 [id number]  Referring provider: Casilda Carls [address] [place], [place] [address]  Chief Complaint  Patient presents with  . Follow-up    BPH    HPI: Patient is a 71 year old Caucasian male with BPH and LUTS and a history of elevated PSA who presents today for 6 month follow-up.   BPH WITH LUTS His IPSS score today is 6, which is mild lower urinary tract symptomatology. He is pleased with his quality life due to his urinary symptoms.  His previous I PSS score was 4/2.  He complains of nocturia x  2.  He denies any dysuria, hematuria or suprapubic pain.  He currently taking tamsulosin 0.4 mg daily and finasteride 5 mg daily.  He also denies any recent fevers, chills, nausea or vomiting.   He does not have a family history of PCa.      IPSS    Row Name 10/18/16 1000         International Prostate Symptom Score   How often have you had the sensation of not emptying your bladder? Less than 1 in 5     How often have you had to urinate less than every two hours? Less than half the time     How often have you found you stopped and started again several times when you urinated? Not at All     How often have you found it difficult to postpone urination? Less than 1 in 5 times     How often have you had a weak urinary stream? Not at All     How often have you had to strain to start urination? Not at All     How many times did you typically get up at night to urinate? 2 Times     Total IPSS Score 6       Quality of Life due to urinary symptoms   If you were to spend the rest of your life with your urinary condition just the way it is now how would you feel about that? Pleased        Score:  1-7 Mild 8-19 Moderate 20-35 Severe   History of elevated PSA Patient's PSA reached 9.8 ng/mL in November 2015. This was in association with a urinary tract infection. When the urinary tract infection was treated in the PSA repeated, it returned  to baseline.  His most recent PSA was 2.6 ng/mL on 10/11/2016.    PMH: Past Medical History:  Diagnosis Date  . BPH (benign prostatic hyperplasia)   . Cerebral palsy ([place])   . Elevated PSA   . Hematuria   . Hemiplegia ([place]) 09/13/2011   Overview:  Upper extremity most affected   . Hydronephrosis   . Kidney stone   . Nocturia   . Obstructive apnea 12/29/2014  . Osteoporosis   . 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 Seizures ([place])   . Sleep apnea   . Urinary retention     Surgical History: Past Surgical History:  Procedure Laterality Date  . APPENDECTOMY    . arm surgery Right    child  . BRAIN SURGERY    . LEG SURGERY Right    child  . TONSILLECTOMY      Home Medications:  Allergies as of 10/18/2016   No Known Allergies     Medication List       Accurate as of 10/18/16  10:53 AM. Always use your most recent med list.          acidophilus Caps capsule Take 1 capsule by mouth daily.   aspirin EC 81 MG tablet Take 81 mg by mouth daily.   cholecalciferol 1000 units tablet Commonly known as:  VITAMIN D Take 5,000 Units by mouth daily.   CRANBERRY PO Take 1 capsule by mouth daily.   finasteride 5 MG tablet Commonly known as:  PROSCAR Take 1 tablet (5 mg total) by mouth daily.   FLAX SEED OIL PO Take 1 capsule by mouth daily.   hydrochlorothiazide 25 MG tablet Commonly known as:  HYDRODIURIL Take 25 mg by mouth daily.   levETIRAcetam 750 MG tablet Commonly known as:  KEPPRA Take 1 tablet (750 mg total) by mouth 2 (two) times daily.   meclizine 12.5 MG tablet Commonly known as:  ANTIVERT Take 1 tablet (12.5 mg total) by mouth 2 (two) times daily as needed for dizziness.   phenytoin 100 MG ER capsule Commonly known as:  DILANTIN Take 300 mg by mouth daily.   tamsulosin 0.4 MG Caps capsule Commonly known as:  FLOMAX Take 1 capsule (0.4 mg total) by mouth daily.   Turmeric 500 MG  Caps Take 1 capsule by mouth daily.   vitamin C 500 MG tablet Commonly known as:  ASCORBIC ACID Take 1,000 mg by mouth daily.       Allergies: No Known Allergies  Family History: Family History  Problem Relation Age of Onset  . Breast cancer Maternal Aunt   . Prostate cancer Neg Hx   . Bladder Cancer Neg Hx   . Kidney cancer Neg Hx     Social History:  reports that he has never smoked. He has never used smokeless tobacco. He reports that he does not drink alcohol or use drugs.  ROS: UROLOGY Frequent Urination?: No Hard to postpone urination?: No Burning/pain with urination?: No Get up at night to urinate?: No Leakage of urine?: No Urine stream starts and stops?: No Trouble starting stream?: No Do you have to strain to urinate?: No Blood in urine?: No Urinary tract infection?: No Sexually transmitted disease?: No Injury to kidneys or bladder?: No Painful intercourse?: No Weak stream?: No Erection problems?: No Penile pain?: No  Gastrointestinal Nausea?: No Vomiting?: No Indigestion/heartburn?: No Diarrhea?: No Constipation?: No  Constitutional Fever: No Night sweats?: No Weight loss?: No Fatigue?: No  Skin Skin rash/lesions?: No Itching?: No  Eyes Blurred vision?: No Double vision?: No  Ears/Nose/Throat Sore throat?: No Sinus problems?: No  Hematologic/Lymphatic Swollen glands?: No Easy bruising?: No  Cardiovascular Leg swelling?: No Chest pain?: No  Respiratory Cough?: No Shortness of breath?: No  Endocrine Excessive thirst?: No  Musculoskeletal Back pain?: No Joint pain?: No  Neurological Headaches?: No Dizziness?: No  Psychologic Depression?: No Anxiety?: No  Physical Exam: BP 119/78   Pulse 85   Ht 5' 7.5" (1.715 m)   Wt 167 lb (75.8 kg)   BMI 25.77 kg/m   GU: No CVA tenderness.  No bladder fullness or masses.  Patient with circumcised phallus.   Urethral meatus is patent.  No penile discharge. No penile lesions  or rashes. Scrotum without lesions, cysts, rashes and/or edema.  Testicles are located scrotally bilaterally. No masses are appreciated in the testicles. Left and right epididymis are normal. Rectal: Patient with  normal sphincter tone. Anus and perineum without scarring or rashes. No rectal masses are appreciated. Prostate is approximately 45 grams, no nodules are  appreciated. Seminal vesicles are normal.  Laboratory Data: Lab Results  Component Value Date   WBC 6.5 06/16/2016   HGB 15.2 06/16/2016   HCT 42.9 06/16/2016   MCV 97.0 06/16/2016   PLT 381 06/16/2016    Lab Results  Component Value Date   CREATININE 0.82 06/16/2016    PSA History:  3.8 ng/mL on 03/18/2013  2.8 ng/mL on 10/04/2013  3.9 ng/mL on 04/23/2014  2.8 ng/mL on 10/16/2014  2.9 ng/mL on 04/20/2015  2.4 ng/mL on 10/13/2015  3.2 ng/mL on 04/14/2016  2.6 ng/mL on 10/11/2016    Assessment & Plan:    1. BPH with LUTS  - IPSS score is 6/1,  it is stable  - Continue conservative management, avoiding bladder irritants and timed voiding's  - Continue tamsulosin 0.4 mg daily and finasteride 5 mg daily; refills given  - RTC in 12 months for IPSS, PSA and exam   2. History of elevated PSA:   Patient's PSA reached 9.8 ng/mL in November 2015. This was in association with a urinary tract infection. When the urinary tract infection was treated in the PSA repeated, it returned to baseline.  His most recent PSA was 2.6 ng/mL on 10/11/2016.  We will continue to monitor. Patient will return in 12 months for PSA and exam.  Return in about 1 year (around 10/18/2017) for IPSS, PSA and exam.  Zara Council, [facility]  [facility] [address], [place], [place] [address] [phone]

## 2016-10-18 ENCOUNTER — Encounter: Payer: Self-pay | Admitting: Urology

## 2016-10-18 ENCOUNTER — Ambulatory Visit (INDEPENDENT_AMBULATORY_CARE_PROVIDER_SITE_OTHER): Payer: Medicare Other | Admitting: Urology

## 2016-10-18 VITALS — BP 119/78 | HR 85 | Ht 67.5 in | Wt 167.0 lb

## 2016-10-18 DIAGNOSIS — N138 Other obstructive and reflux uropathy: Secondary | ICD-10-CM | POA: Diagnosis not present

## 2016-10-18 DIAGNOSIS — N401 Enlarged prostate with lower urinary tract symptoms: Secondary | ICD-10-CM

## 2016-10-18 DIAGNOSIS — Z87898 Personal history of other specified conditions: Secondary | ICD-10-CM

## 2016-10-18 MED ORDER — TAMSULOSIN HCL 0.4 MG PO CAPS: 0.4000 mg | ORAL_CAPSULE | Freq: Every day | ORAL | 3 refills | Status: DC

## 2016-10-18 MED ORDER — FINASTERIDE 5 MG PO TABS: 5.0000 mg | ORAL_TABLET | Freq: Every day | ORAL | 12 refills | Status: DC

## 2016-11-11 ENCOUNTER — Encounter (INDEPENDENT_AMBULATORY_CARE_PROVIDER_SITE_OTHER): Payer: Self-pay | Admitting: Vascular Surgery

## 2016-11-11 ENCOUNTER — Ambulatory Visit (INDEPENDENT_AMBULATORY_CARE_PROVIDER_SITE_OTHER): Payer: Medicare Other | Admitting: Vascular Surgery

## 2016-11-11 VITALS — BP 125/75 | HR 92 | Resp 16 | Ht 69.0 in | Wt 163.0 lb

## 2016-11-11 DIAGNOSIS — I6523 Occlusion and stenosis of bilateral carotid arteries: Secondary | ICD-10-CM

## 2016-11-11 DIAGNOSIS — I10 Essential (primary) hypertension: Secondary | ICD-10-CM | POA: Diagnosis not present

## 2016-11-11 DIAGNOSIS — G8191 Hemiplegia, unspecified affecting right dominant side: Secondary | ICD-10-CM | POA: Diagnosis not present

## 2016-11-11 DIAGNOSIS — I6529 Occlusion and stenosis of unspecified carotid artery: Secondary | ICD-10-CM

## 2016-11-11 HISTORY — DX: Occlusion and stenosis of unspecified carotid artery: I65.29

## 2016-11-11 NOTE — Assessment & Plan Note (Signed)
From CP, not a stroke

## 2016-11-11 NOTE — Patient Instructions (Signed)
Carotid Artery Disease The carotid arteries are the two main arteries on either side of the neck that supply blood to the brain. Carotid artery disease, also called carotid artery stenosis, is the narrowing or blockage of one or both carotid arteries. Carotid artery disease increases your risk for a stroke or a transient ischemic attack (TIA). A TIA is an episode in which a waxy, fatty substance that accumulates within the artery (plaque) blocks blood flow to the brain. A TIA is considered a "warning stroke." What are the causes?  Buildup of plaque inside the carotid arteries (atherosclerosis) (common).  A weakened outpouching in an artery (aneurysm).  Inflammation of the carotid artery (arteritis).  A fibrous growth within the carotid artery (fibromuscular dysplasia).  Tissue death within the carotid artery due to radiation treatment (post-radiation necrosis).  Decreased blood flow due to spasms of the carotid artery (vasospasm).  Separation of the walls of the carotid artery (carotid dissection). What increases the risk?  High cholesterol (dyslipidemia).  High blood pressure (hypertension).  Smoking.  Obesity.  Diabetes.  Family history of cardiovascular disease.  Inactivity or lack of regular exercise.  Being male. Men have an increased risk of developing atherosclerosis earlier in life than women. What are the signs or symptoms? Carotid artery disease does not cause symptoms. How is this diagnosed? Diagnosis of carotid artery disease may include:  A physical exam. Your health care provider may hear an abnormal sound (bruit) when listening to the carotid arteries.  Specific tests that look at the blood flow in the carotid arteries. These tests include: ? Carotid artery ultrasonography. ? Carotid or cerebral angiography. ? Computerized tomographic angiography (CTA). ? Magnetic resonance angiography (MRA).  How is this treated? Treatment of carotid artery disease can  include a combination of treatments. Treatment options include:  Surgery. You may have: ? A carotid endarterectomy. This is a surgery to remove the blockages in the carotid arteries. ? A carotid angioplasty with stenting. This is a nonsurgical interventional procedure. A wire mesh (stent) is used to widen the blocked carotid arteries.  Medicines to control blood pressure, cholesterol, and reduce blood clotting (antiplatelet therapy).  Adjusting your diet.  Lifestyle changes such as: ? Quitting smoking. ? Exercising as tolerated or as directed by your health care provider. ? Controlling and maintaining a good blood pressure. ? Keeping cholesterol levels under control.  Follow these instructions at home:  Take medicines only as directed by your health care provider. Make sure you understand all your medicine instructions. Do not stop your medicines without talking to your health care provider.  Follow your health care provider's diet instructions. It is important to eat a healthy diet that is low in saturated fats and includes plenty of fresh fruits, vegetables, and lean meats. High-fat, high-sodium foods as well as foods that are fried, overly processed, or have poor nutritional value should be avoided.  Maintain a healthy weight.  Stay physically active. It is recommended that you get at least 30 minutes of activity every day.  Do not use any tobacco products including cigarettes, chewing tobacco, or electronic cigarettes. If you need help quitting, ask your health care provider.  Limit alcohol use to: ? No more than 2 drinks per day for men. ? No more than 1 drink per day for nonpregnant women.  Do not use illegal drugs.  Keep all follow-up visits as directed by your health care provider. Get help right away if: You develop TIA or stroke symptoms. These include:  Sudden   weakness or numbness on one side of the body, such as in the face, arm, or leg.  Sudden  confusion.  Trouble speaking (aphasia) or understanding.  Sudden trouble seeing out of one or both eyes.  Sudden trouble walking.  Dizziness or feeling like you might faint.  Loss of balance or coordination.  Sudden severe headache with no known cause.  Sudden trouble swallowing (dysphagia).  If you have any of these symptoms, call your local emergency services (911 in [place] Do not drive yourself to the clinic or hospital. This is a medical emergency. This information is not intended to replace advice given to you by your health care provider. Make sure you discuss any questions you have with your health care provider. Document Released: 08/22/2011 Document Revised: 11/05/2015 Document Reviewed: 11/28/2012 Elsevier Interactive Patient Education  2017 Elsevier Inc.  

## 2016-11-11 NOTE — Progress Notes (Signed)
Patient ID: Dylan Sullivan, male   DOB: 09/24/1945, 71 y.o.   MRN: [id number]  Chief Complaint  Patient presents with  . New Patient (Initial Visit)    HPI Aarik Blank is a 71 y.o. male.  I am asked to see the patient by Dr. Lavera Guise for evaluation of carotid artery disease.  The patient reports no symptoms.  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  He has seizures that are long standing too. He was not clear what prompted his primary care physician to order a carotid ultrasound, but I have reviewed this study. He denies stroke or TIA symptoms. His carotid duplex demonstrated velocities that would be consistent with 40-59% left carotid artery stenosis and stenosis in the upper  end 1-39% in the right carotid artery. He is referred for further evaluation and treatment.   Past Medical History:  Diagnosis Date  . BPH (benign prostatic hyperplasia)   . Cerebral palsy ([place])   . Elevated PSA   . Hematuria   . Hemiplegia ([place]) 09/13/2011   Overview:  Upper extremity most affected   . Hydronephrosis   . Kidney stone   . Nocturia   . Obstructive apnea 12/29/2014  . Osteoporosis   . 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 Seizures ([place])   . Sleep apnea   . Urinary retention     Past Surgical History:  Procedure Laterality Date  . APPENDECTOMY    . arm surgery Right    child  . BRAIN SURGERY    . LEG SURGERY Right    child  . TONSILLECTOMY      Family History  Problem Relation Age of Onset  . Breast cancer Maternal Aunt   . Prostate cancer Neg Hx   . Bladder Cancer Neg Hx   . Kidney cancer Neg Hx    No bleeding disorders, clotting disorders, or aneurysms   Social History Social History  Substance Use Topics  . Smoking status: Never Smoker  . Smokeless tobacco: Never Used  . Alcohol use No  No IV drug use  No Known Allergies  Current Outpatient Prescriptions  Medication Sig Dispense  Refill  . acidophilus (RISAQUAD) CAPS capsule Take 1 capsule by mouth daily.    Marland Kitchen aspirin EC 81 MG tablet Take 81 mg by mouth daily.    . cholecalciferol (VITAMIN D) 1000 units tablet Take 5,000 Units by mouth daily.    Marland Kitchen CRANBERRY PO Take 1 capsule by mouth daily.    . finasteride (PROSCAR) 5 MG tablet Take 1 tablet (5 mg total) by mouth daily. 30 tablet 12  . Flaxseed, Linseed, (FLAX SEED OIL PO) Take 1 capsule by mouth daily.    . hydrochlorothiazide (HYDRODIURIL) 25 MG tablet Take 25 mg by mouth daily.    Marland Kitchen levETIRAcetam (KEPPRA) 750 MG tablet Take 1 tablet (750 mg total) by mouth 2 (two) times daily. 60 tablet 0  . meclizine (ANTIVERT) 12.5 MG tablet Take 1 tablet (12.5 mg total) by mouth 2 (two) times daily as needed for dizziness. 30 tablet 0  . phenytoin (DILANTIN) 100 MG ER capsule Take 300 mg by mouth daily.    . tamsulosin (FLOMAX) 0.4 MG CAPS capsule Take 1 capsule (0.4 mg total) by mouth daily. 30 capsule 3  . Turmeric 500 MG CAPS Take 1 capsule by mouth daily.    . vitamin C (ASCORBIC ACID) 500 MG tablet Take 1,000 mg  by mouth daily.     No current facility-administered medications for this visit.       REVIEW OF SYSTEMS (Negative unless checked)  Constitutional: [] Weight loss  [] Fever  [] Chills Cardiac: [] Chest pain   [] Chest pressure   [] Palpitations   [] Shortness of breath when laying flat   [] Shortness of breath at rest   [] Shortness of breath with exertion. Vascular:  [] Pain in legs with walking   [] Pain in legs at rest   [] Pain in legs when laying flat   [] Claudication   [] Pain in feet when walking  [] Pain in feet at rest  [] Pain in feet when laying flat   [] History of DVT   [] Phlebitis   [] Swelling in legs   [] Varicose veins   [] Non-healing ulcers Pulmonary:   [] Uses home oxygen   [] Productive cough   [] Hemoptysis   [] Wheeze  [] COPD   [] Asthma Neurologic:  [] Dizziness  [] Blackouts   [x] Seizures   [] History of stroke   [] History of TIA  [] Aphasia   [] Temporary blindness    [] Dysphagia   [x] Weakness or numbness in arms   [x] Weakness or numbness in legs Musculoskeletal:  [] Arthritis   [] Joint swelling   [] Joint pain   [] Low back pain Hematologic:  [] Easy bruising  [] Easy bleeding   [] Hypercoagulable state   [] Anemic  [] Hepatitis Gastrointestinal:  [] Blood in stool   [] Vomiting blood  [] Gastroesophageal reflux/heartburn   [] Abdominal pain Genitourinary:  [] Chronic kidney disease   [] Difficult urination  [] Frequent urination  [] Burning with urination   [] Hematuria Skin:  [] Rashes   [] Ulcers   [] Wounds Psychological:  [] History of anxiety   []  History of major depression.    Physical Exam BP 125/75   Pulse 92   Resp 16   Ht 5\' 9"  (1.753 m)   Wt 73.9 kg (163 lb)   BMI 24.07 kg/m  Gen:  WD/WN, NAD Head: [place]/AT, No temporalis wasting. Ear/Nose/Throat: Hearing grossly intact, nares w/o erythema or drainage, oropharynx w/o Erythema/Exudate Eyes: Conjunctiva clear, sclera non-icteric  Neck: trachea midline.  No JVD.  Pulmonary:  Good air movement, clear to auscultation bilaterally.  Cardiac: RRR, normal S1, S2, no Murmurs, rubs or gallops. Vascular:  Vessel Right Left  Radial Palpable Palpable  Ulnar Palpable Palpable  Brachial Palpable Palpable  Carotid Palpable, without bruit Palpable, with soft  bruit  Aorta Not palpable N/A  Femoral Palpable Palpable  Popliteal Palpable Palpable  PT Palpable Palpable  DP Palpable Palpable   Gastrointestinal: soft, non-tender/non-distended.  Musculoskeletal:  Right hemiplegia present.  Mild lower extremity edema. Neurologic: Sensation grossly intact in extremities.  Symmetrical.  Speech is fluent. Motor exam as listed above. Psychiatric: Judgment intact, Mood & affect appropriate for pt's clinical situation. Dermatologic: No rashes or ulcers noted.  No cellulitis or open wounds.   Radiology No results found.  Labs Recent Results (from the past 2160 hour(s))  PSA     Status: None   Collection Time: 10/11/16  11:00 AM  Result Value Ref Range   Prostate Specific Ag, Serum 2.6 0.0 - 4.0 ng/mL    Comment: Roche ECLIA methodology. According to the American Urological Association, Serum PSA should decrease and remain at undetectable levels after radical prostatectomy. The AUA defines biochemical recurrence as an initial PSA value 0.2 ng/mL or greater followed by a subsequent confirmatory PSA value 0.2 ng/mL or greater. Values obtained with different assay methods or kits cannot be used interchangeably. Results cannot be interpreted as absolute evidence of the presence or absence of malignant disease.  Assessment/Plan:  BP (high blood pressure) blood pressure control important in reducing the progression of atherosclerotic disease. On appropriate oral medications.   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 We had a long discussion today regarding the pathophysiology and natural history of carotid artery disease. I would recommend he continue aspirin therapy. I've asked him to discuss the addition of a statin agent with his primary care physician but I think this would be prudent. I would recommend this be followed with the next ultrasound in 4-6 months. No intervention is required at this point the stenosis is not severe enough to recommend a CT scan either. I discussed signs and symptoms of stroke. He will contact our office with any problems in the interim.      Leotis Pain 11/11/2016, 5:07 PM   This note was created with Dragon medical transcription system.  Any errors from dictation are unintentional.

## 2016-11-11 NOTE — Assessment & Plan Note (Signed)
His carotid duplex demonstrated velocities that would be consistent with 40-59% left carotid artery stenosis and stenosis in the upper  end 1-39% in the right carotid artery. We had a long discussion today regarding the pathophysiology and natural history of carotid artery disease. I would recommend he continue aspirin therapy. I've asked him to discuss the addition of a statin agent with his primary care physician but I think this would be prudent. I would recommend this be followed with the next ultrasound in 4-6 months. No intervention is required at this point the stenosis is not severe enough to recommend a CT scan either. I discussed signs and symptoms of stroke. He will contact our office with any problems in the interim.

## 2016-11-11 NOTE — Assessment & Plan Note (Signed)
blood pressure control important in reducing the progression of atherosclerotic disease. On appropriate oral medications.  

## 2017-02-16 ENCOUNTER — Other Ambulatory Visit: Payer: Self-pay | Admitting: Urology

## 2017-02-16 DIAGNOSIS — N401 Enlarged prostate with lower urinary tract symptoms: Principal | ICD-10-CM

## 2017-02-16 DIAGNOSIS — N138 Other obstructive and reflux uropathy: Secondary | ICD-10-CM

## 2017-04-18 ENCOUNTER — Encounter (INDEPENDENT_AMBULATORY_CARE_PROVIDER_SITE_OTHER): Payer: Self-pay

## 2017-04-25 ENCOUNTER — Ambulatory Visit (INDEPENDENT_AMBULATORY_CARE_PROVIDER_SITE_OTHER): Payer: Medicare Other | Admitting: Vascular Surgery

## 2017-04-25 ENCOUNTER — Encounter (INDEPENDENT_AMBULATORY_CARE_PROVIDER_SITE_OTHER): Payer: Self-pay | Admitting: Vascular Surgery

## 2017-04-25 ENCOUNTER — Ambulatory Visit (INDEPENDENT_AMBULATORY_CARE_PROVIDER_SITE_OTHER): Payer: Medicare Other

## 2017-04-25 VITALS — BP 165/82 | HR 79 | Resp 16 | Ht 69.0 in | Wt 157.0 lb

## 2017-04-25 DIAGNOSIS — G8191 Hemiplegia, unspecified affecting right dominant side: Secondary | ICD-10-CM | POA: Diagnosis not present

## 2017-04-25 DIAGNOSIS — I10 Essential (primary) hypertension: Secondary | ICD-10-CM | POA: Diagnosis not present

## 2017-04-25 DIAGNOSIS — I6523 Occlusion and stenosis of bilateral carotid arteries: Secondary | ICD-10-CM | POA: Diagnosis not present

## 2017-04-25 NOTE — Patient Instructions (Signed)
Carotid Artery Disease The carotid arteries are arteries on both sides of the neck. They carry blood to the brain. Carotid artery disease is when the arteries get smaller (narrow) or get blocked. If these arteries get smaller or get blocked, you are more likely to have a stroke or warning stroke (transient ischemic attack). Follow these instructions at home:  Take medicines as told by your doctor. Make sure you understand all your medicine instructions. Do not stop your medicines without talking to your doctor first.  Follow your doctor's diet instructions. It is important to eat a healthy diet that includes plenty of: ? Fresh fruits. ? Vegetables. ? Lean meats.  Avoid: ? High-fat foods. ? High-sodium foods. ? Foods that are fried, overly processed, or have poor nutritional value.  Stay a healthy weight.  Stay active. Get at least 30 minutes of activity every day.  Do not smoke.  Limit alcohol use to: ? No more than 2 drinks a day for men. ? No more than 1 drink a day for women who are not pregnant.  Do not use illegal drugs.  Keep all doctor visits as told. Get help right away if:  You have sudden weakness or loss of feeling (numbness) on one side of the body, such as the face, arm, or leg.  You have sudden confusion.  You have trouble speaking (aphasia) or understanding.  You have sudden trouble seeing out of one or both eyes.  You have sudden trouble walking.  You have dizziness or feel like you might pass out (faint).  You have a loss of balance or your movements are not steady (uncoordinated).  You have a sudden, severe headache with no known cause.  You have trouble swallowing (dysphagia). Call your local emergency services (911 in [place] Do notdrive yourself to the clinic or hospital. This information is not intended to replace advice given to you by your health care provider. Make sure you discuss any questions you have with your health care  provider. Document Released: 05/16/2012 Document Revised: 11/05/2015 Document Reviewed: 11/28/2012 Elsevier Interactive Patient Education  2018 Elsevier Inc.  

## 2017-04-25 NOTE — Assessment & Plan Note (Signed)
Carotid duplex today showed better velocities then previous studies with velocities that would fall in the 1-39% range bilaterally today.  Both vertebral arteries are antegrade. He should continue his aspirin therapy daily.  I will still plan to check this 1 year with higher velocities previously.  No role for intervention or surgery at this point with only mild stenosis.

## 2017-04-25 NOTE — Progress Notes (Signed)
MRN : [id number]  Dylan Sullivan is a 71 y.o. ([birth year]) male who presents with chief complaint of  Chief Complaint  Patient presents with  . Follow-up    4-6 month carotid check  .  History of Present Illness: Patient returns in follow-up of his carotid disease.  He is doing well.  He has been taking 4 baby aspirins a day.  He has no new complaints.  He denies focal neurologic symptoms. Specifically, the patient denies amaurosis fugax, speech or swallowing difficulties, or arm or leg weakness or numbness.  Carotid duplex today showed better velocities then previous studies with velocities that would fall in the 1-39% range bilaterally today.  Both vertebral arteries are antegrade.  Past Medical History:  Diagnosis Date  . BPH (benign prostatic hyperplasia)   . Cerebral palsy ([place])   . Elevated PSA   . Hematuria   . Hemiplegia ([place]) 09/13/2011   Overview:  Upper extremity most affected   . Hydronephrosis   . Kidney stone   . Nocturia   . Obstructive apnea 12/29/2014  . Osteoporosis   . 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 Seizures ([place])   . Sleep apnea   . Urinary retention     Past Surgical History:  Procedure Laterality Date  . APPENDECTOMY    . arm surgery Right    child  . BRAIN SURGERY    . LEG SURGERY Right    child  . TONSILLECTOMY           Family History  Problem Relation Age of Onset  . Breast cancer Maternal Aunt   . Prostate cancer Neg Hx   . Bladder Cancer Neg Hx   . Kidney cancer Neg Hx    No bleeding disorders, clotting disorders, or aneurysms   Social History     Social History  Substance Use Topics  . Smoking status: Never Smoker  . Smokeless tobacco: Never Used  . Alcohol use No  No IV drug use  No Known Allergies        Current Outpatient Prescriptions  Medication Sig Dispense Refill  . acidophilus (RISAQUAD) CAPS capsule Take 1  capsule by mouth daily.    Marland Kitchen aspirin EC 81 MG tablet Take 81 mg by mouth daily.    . cholecalciferol (VITAMIN D) 1000 units tablet Take 5,000 Units by mouth daily.    Marland Kitchen CRANBERRY PO Take 1 capsule by mouth daily.    . finasteride (PROSCAR) 5 MG tablet Take 1 tablet (5 mg total) by mouth daily. 30 tablet 12  . Flaxseed, Linseed, (FLAX SEED OIL PO) Take 1 capsule by mouth daily.    . hydrochlorothiazide (HYDRODIURIL) 25 MG tablet Take 25 mg by mouth daily.    Marland Kitchen levETIRAcetam (KEPPRA) 750 MG tablet Take 1 tablet (750 mg total) by mouth 2 (two) times daily. 60 tablet 0  . meclizine (ANTIVERT) 12.5 MG tablet Take 1 tablet (12.5 mg total) by mouth 2 (two) times daily as needed for dizziness. 30 tablet 0  . phenytoin (DILANTIN) 100 MG ER capsule Take 300 mg by mouth daily.    . tamsulosin (FLOMAX) 0.4 MG CAPS capsule Take 1 capsule (0.4 mg total) by mouth daily. 30 capsule 3  . Turmeric 500 MG CAPS Take 1 capsule by mouth daily.    . vitamin C (ASCORBIC ACID) 500 MG tablet Take 1,000 mg by mouth daily.     No current facility-administered medications  for this visit.       REVIEW OF SYSTEMS (Negative unless checked)  Constitutional: [] Weight loss  [] Fever  [] Chills Cardiac: [] Chest pain   [] Chest pressure   [] Palpitations   [] Shortness of breath when laying flat   [] Shortness of breath at rest   [] Shortness of breath with exertion. Vascular:  [] Pain in legs with walking   [] Pain in legs at rest   [] Pain in legs when laying flat   [] Claudication   [] Pain in feet when walking  [] Pain in feet at rest  [] Pain in feet when laying flat   [] History of DVT   [] Phlebitis   [] Swelling in legs   [] Varicose veins   [] Non-healing ulcers Pulmonary:   [] Uses home oxygen   [] Productive cough   [] Hemoptysis   [] Wheeze  [] COPD   [] Asthma Neurologic:  [] Dizziness  [] Blackouts   [x] Seizures   [] History of stroke   [] History of TIA  [] Aphasia   [] Temporary blindness   [] Dysphagia   [x] Weakness or  numbness in arms   [x] Weakness or numbness in legs Musculoskeletal:  [] Arthritis   [] Joint swelling   [] Joint pain   [] Low back pain Hematologic:  [] Easy bruising  [] Easy bleeding   [] Hypercoagulable state   [] Anemic  [] Hepatitis Gastrointestinal:  [] Blood in stool   [] Vomiting blood  [] Gastroesophageal reflux/heartburn   [] Abdominal pain Genitourinary:  [] Chronic kidney disease   [] Difficult urination  [] Frequent urination  [] Burning with urination   [] Hematuria Skin:  [] Rashes   [] Ulcers   [] Wounds Psychological:  [] History of anxiety   []  History of major depression.      Physical Examination  Vitals:   04/25/17 1348  BP: (!) 165/82  Pulse: 79  Resp: 16  Weight: 157 lb (71.2 kg)  Height: 5\' 9"  (1.753 m)   Body mass index is 23.18 kg/m. Gen:  WD/WN, NAD Head: [place]/AT, No temporalis wasting. Ear/Nose/Throat: Hearing grossly intact, nares w/o erythema or drainage, trachea midline Eyes: Conjunctiva clear. Sclera non-icteric Neck: Supple.  No bruit or JVD.  Pulmonary:  Good air movement, equal and clear to auscultation bilaterally.  Cardiac: RRR, no JVD Vascular:  Vessel Right Left  Radial Palpable Palpable                                     Musculoskeletal: right sided weakness, profound in the arm.  No edema Neurologic: CN 2-12 intact. Sensation grossly intact in extremities. Speech is fluent. Motor exam as listed above. Psychiatric: Judgment intact, Mood & affect appropriate for pt's clinical situation. Dermatologic: No rashes or ulcers noted.  No cellulitis or open wounds.      CBC Lab Results  Component Value Date   WBC 6.5 06/16/2016   HGB 15.2 06/16/2016   HCT 42.9 06/16/2016   MCV 97.0 06/16/2016   PLT 381 06/16/2016    BMET    Component Value Date/Time   NA 137 06/16/2016 0853   NA 136 09/09/2012 1330   K 3.5 06/16/2016 0853   K 3.6 09/09/2012 1330   CL 101 06/16/2016 0853   CL 99 09/09/2012 1330   CO2 27 06/16/2016 0853   CO2 30  09/09/2012 1330   GLUCOSE 178 (H) 06/16/2016 0853   GLUCOSE 124 (H) 09/09/2012 1330   BUN 16 06/16/2016 0853   BUN 19 (H) 09/09/2012 1330   CREATININE 0.82 06/16/2016 0853   CREATININE 1.15 09/09/2012 1330   CALCIUM 9.9 06/16/2016  2902   CALCIUM 10.2 (H) 09/09/2012 1330   GFRNONAA >60 06/16/2016 0853   GFRAA >60 06/16/2016 0853   CrCl cannot be calculated (Patient's most recent lab result is older than the maximum 21 days allowed.).  COAG No results found for: INR, PROTIME  Radiology No results found.    Assessment/Plan BP (high blood pressure) blood pressure control important in reducing the progression of atherosclerotic disease. On appropriate oral medications.   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  Both vertebral arteries are antegrade. He should continue his aspirin therapy daily.  I will still plan to check this 1 year with higher velocities previously.  No role for intervention or surgery at this point with only mild stenosis.    Leotis Pain, MD  04/25/2017 3:13 PM    This note was created with Dragon medical transcription system.  Any errors from dictation are purely unintentional

## 2017-07-10 ENCOUNTER — Encounter: Payer: Self-pay | Admitting: Gastroenterology

## 2017-07-10 ENCOUNTER — Ambulatory Visit (INDEPENDENT_AMBULATORY_CARE_PROVIDER_SITE_OTHER): Payer: Medicare Other | Admitting: Gastroenterology

## 2017-07-10 ENCOUNTER — Other Ambulatory Visit: Payer: Self-pay

## 2017-07-10 ENCOUNTER — Other Ambulatory Visit
Admission: RE | Admit: 2017-07-10 | Discharge: 2017-07-10 | Disposition: A | Discharge status: Home / Self Care | Payer: Medicare Other | Source: Ambulatory Visit | Attending: Gastroenterology | Admitting: Gastroenterology

## 2017-07-10 ENCOUNTER — Encounter (INDEPENDENT_AMBULATORY_CARE_PROVIDER_SITE_OTHER): Payer: Self-pay

## 2017-07-10 VITALS — BP 135/68 | HR 88 | Ht 69.0 in | Wt 161.1 lb

## 2017-07-10 DIAGNOSIS — D649 Anemia, unspecified: Secondary | ICD-10-CM | POA: Diagnosis not present

## 2017-07-10 LAB — CBC WITH DIFFERENTIAL/PLATELET
Basophils Absolute: 0.1 10*3/uL (ref 0–0.1)
Basophils Relative: 1 %
Eosinophils Absolute: 0.1 10*3/uL (ref 0–0.7)
Eosinophils Relative: 2 %
HCT: 37.9 % — ABNORMAL LOW (ref 40.0–52.0)
Hemoglobin: 13.2 g/dL (ref 13.0–18.0)
Lymphocytes Relative: 25 %
Lymphs Abs: 2 10*3/uL (ref 1.0–3.6)
MCH: 34.5 pg — ABNORMAL HIGH (ref 26.0–34.0)
MCHC: 34.8 g/dL (ref 32.0–36.0)
MCV: 99.1 fL (ref 80.0–100.0)
Monocytes Absolute: 0.9 10*3/uL (ref 0.2–1.0)
Monocytes Relative: 12 %
Neutro Abs: 4.9 10*3/uL (ref 1.4–6.5)
Neutrophils Relative %: 60 %
Platelets: 401 10*3/uL (ref 150–440)
RBC: 3.82 MIL/uL — ABNORMAL LOW (ref 4.40–5.90)
RDW: 12.6 % (ref 11.5–14.5)
WBC: 8 10*3/uL (ref 3.8–10.6)

## 2017-07-10 LAB — IRON AND TIBC
Iron: 40 ug/dL — ABNORMAL LOW (ref 45–182)
Saturation Ratios: 17 % — ABNORMAL LOW (ref 17.9–39.5)
TIBC: 231 ug/dL — ABNORMAL LOW (ref 250–450)
UIBC: 191 ug/dL

## 2017-07-10 LAB — FERRITIN: Ferritin: 56 ng/mL (ref 24–336)

## 2017-07-10 NOTE — Progress Notes (Signed)
Dylan Sullivan [address]  [place]  [place], [place] [address]  Main: [phone]  Fax: [phone]   Gastroenterology Consultation  Referring Provider:     Casilda Carls, MD Primary Care Physician:  Casilda Carls, MD Primary Gastroenterologist:  Dr. Vonda Sullivan Reason for Consultation:     Anemia        HPI:   Dylan Sullivan is a 72 y.o. y/o male referred for consultation & management  by Dr. Casilda Carls, MD.  Patient states he was referred for anemia.  Denies any blood in his stool.  Denies any blood in his urine, or any epistaxis.  Denies any previous history of anemia.  The referral notes sent show a hemoglobin of around 12.8.  MCV is 100.  No iron or ferritin levels are available in that scan report.  No previous ferritin or iron testing available.  Last colonoscopy was in November 2015 by Dr. Allen Norris..  3 mm polyp removed from the ascending colon.  Found to be lymphoid aggregate with germinal center formation.  No previous EGDs.  No family history of colon cancer.  Patient denies any abdominal pain, dysphagia, weight loss, altered bowel habits, constipation or diarrhea.  No heartburn.  Past Medical History:  Diagnosis Date  . BPH (benign prostatic hyperplasia)   . Cerebral palsy ([place])   . Elevated PSA   . Hematuria   . Hemiplegia ([place]) 09/13/2011   Overview:  Upper extremity most affected   . Hydronephrosis   . Kidney stone   . Nocturia   . Obstructive apnea 12/29/2014  . Osteoporosis   . 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 Seizures ([place])   . Sleep apnea   . Urinary retention     Past Surgical History:  Procedure Laterality Date  . APPENDECTOMY    . arm surgery Right    child  . BRAIN SURGERY    . LEG SURGERY Right    child  . TONSILLECTOMY      Prior to Admission medications   Medication Sig Start Date End Date Taking? Authorizing Provider  acidophilus (RISAQUAD)  CAPS capsule Take 1 capsule by mouth daily.   Yes [provider]  aspirin EC 81 MG tablet Take 81 mg by mouth daily.   Yes [provider]  calcium carbonate (TUMS EX) 750 MG chewable tablet Chew by mouth.   Yes [provider]  CAPSICUM, CAYENNE, PO Take by mouth.   Yes [provider]  cholecalciferol (VITAMIN D) 1000 units tablet Take 5,000 Units by mouth daily.   Yes [provider]  CRANBERRY PO Take 1 capsule by mouth daily.   Yes [provider]  finasteride (PROSCAR) 5 MG tablet Take 1 tablet (5 mg total) by mouth daily. 10/18/16  Yes McGowan, Larene Beach A, PA-C  Flaxseed, Linseed, (FLAX SEED OIL PO) Take 1 capsule by mouth daily.   Yes [provider]  levETIRAcetam (KEPPRA) 750 MG tablet Take 1 tablet (750 mg total) by mouth 2 (two) times daily. 06/18/16  Yes Fritzi Mandes, MD  phenytoin (DILANTIN) 100 MG ER capsule Take 300 mg by mouth daily.   Yes [provider]  tamsulosin (FLOMAX) 0.4 MG CAPS capsule TAKE ONE CAPSULE BY MOUTH EVERY DAY 02/17/17  Yes McGowan, Larene Beach A, PA-C  Turmeric 500 MG CAPS Take 1 capsule by mouth daily.   Yes [provider]  vitamin C (ASCORBIC ACID) 500 MG tablet Take 1,000  mg by mouth daily.   Yes [provider]  hydrochlorothiazide (HYDRODIURIL) 25 MG tablet Take 25 mg by mouth daily.    [provider]  meclizine (ANTIVERT) 12.5 MG tablet Take 1 tablet (12.5 mg total) by mouth 2 (two) times daily as needed for dizziness. Patient not taking: Reported on 07/10/2017 06/17/16   Fritzi Mandes, MD    Family History  Problem Relation Age of Onset  . Breast cancer Maternal Aunt   . Prostate cancer Neg Hx   . Bladder Cancer Neg Hx   . Kidney cancer Neg Hx      Social History   Tobacco Use  . Smoking status: Never Smoker  . Smokeless tobacco: Never Used  Substance Use Topics  . Alcohol use: No    Alcohol/week: 0.0 oz  . Drug use: No    Allergies as of 07/10/2017    . (No Known Allergies)    Review of Systems:    All systems reviewed and negative except where noted in HPI.   Physical Exam:  BP 135/68   Pulse 88   Ht 5\' 9"  (1.753 m)   Wt 161 lb 1.6 oz (73.1 kg)   BMI 23.79 kg/m  No LMP for male patient. Psych:  Alert and cooperative. Normal mood and affect. General:   Alert,  Well-developed, well-nourished, pleasant and cooperative in NAD Head:  Normocephalic and atraumatic. Eyes:  Sclera clear, no icterus.   Conjunctiva pink. Ears:  Normal auditory acuity. Nose:  No deformity, discharge, or lesions. Mouth:  No deformity or lesions,oropharynx pink & moist. Neck:  Supple; no masses or thyromegaly. Lungs:  Respirations even and unlabored.  Clear throughout to auscultation.   No wheezes, crackles, or rhonchi. No acute distress. Heart:  Regular rate and rhythm; no murmurs, clicks, rubs, or gallops. Abdomen:  Normal bowel sounds.  No bruits.  Soft, non-tender and non-distended without masses, hepatosplenomegaly or hernias noted.  No guarding or rebound tenderness.    Msk:  Symmetrical without gross deformities. Good, equal movement & strength bilaterally. Pulses:  Normal pulses noted. Extremities:  No clubbing or edema.  No cyanosis. Neurologic:  Alert and oriented x3;  grossly normal neurologically. Skin:  Intact without significant lesions or rashes. No jaundice. Lymph Nodes:  No significant cervical adenopathy. Psych:  Alert and cooperative. Normal mood and affect.   Labs: CBC    Component Value Date/Time   WBC 6.5 06/16/2016 0853   RBC 4.42 06/16/2016 0853   HGB 15.2 06/16/2016 0853   HGB 15.2 09/09/2012 1330   HCT 42.9 06/16/2016 0853   HCT 42.6 09/09/2012 1330   PLT 381 06/16/2016 0853   PLT 370 09/09/2012 1330   MCV 97.0 06/16/2016 0853   MCV 98 09/09/2012 1330   MCH 34.4 (H) 06/16/2016 0853   MCHC 35.4 06/16/2016 0853   RDW 12.4 06/16/2016 0853   RDW 12.3 09/09/2012 1330   LYMPHSABS 1.3 06/16/2016 0853   MONOABS 0.6  06/16/2016 0853   EOSABS 0.0 06/16/2016 0853   BASOSABS 0.1 06/16/2016 0853   CMP     Component Value Date/Time   NA 137 06/16/2016 0853   NA 136 09/09/2012 1330   K 3.5 06/16/2016 0853   K 3.6 09/09/2012 1330   CL 101 06/16/2016 0853   CL 99 09/09/2012 1330   CO2 27 06/16/2016 0853   CO2 30 09/09/2012 1330   GLUCOSE 178 (H) 06/16/2016 0853   GLUCOSE 124 (H) 09/09/2012 1330   BUN 16 06/16/2016 0853  BUN 19 (H) 09/09/2012 1330   CREATININE 0.82 06/16/2016 0853   CREATININE 1.15 09/09/2012 1330   CALCIUM 9.9 06/16/2016 0853   CALCIUM 10.2 (H) 09/09/2012 1330   PROT 7.4 06/16/2016 0853   ALBUMIN 4.0 06/16/2016 0853   AST 25 06/16/2016 0853   ALT 15 (L) 06/16/2016 0853   ALKPHOS 93 06/16/2016 0853   BILITOT 1.1 06/16/2016 0853   GFRNONAA >60 06/16/2016 0853   GFRAA >60 06/16/2016 0853    Imaging Studies: No results found.  Assessment and Plan:   Dylan Sullivan is a 72 y.o. y/o male has been referred for anemia  Scanned lab report show a hemoglobin of 12.8 but MCV of 100 We will need to obtain iron and ferritin levels to evaluate for any underlying iron deficiency If iron deficiency present, EGD and colonoscopy can be done to evaluate for iron deficiency anemia If iron deficiency not present, will defer to PCP to workup his elevated MCV with B12 and folate levels etc.  Last colonoscopy was in November 2015 with polyp showing lymphoid aggregate.  Recommendations were to repeat in 10 years if polyps show a hyperplastic, or 5 years if they it were adenoma.  Dr Dylan Sullivan

## 2017-07-10 NOTE — Patient Instructions (Signed)
F/U  3 months 

## 2017-07-18 ENCOUNTER — Telehealth: Payer: Self-pay | Admitting: Gastroenterology

## 2017-07-18 ENCOUNTER — Telehealth: Payer: Self-pay

## 2017-07-18 NOTE — Telephone Encounter (Signed)
Advised patient of results per Dr. Bonna Gains:   - his labwork did not show any iron deficiency anemia. He should follow up with his PCP to get workup for B12 or folate deficiency. Please fax this note, my last note and labs to his PCP.

## 2017-07-18 NOTE — Telephone Encounter (Signed)
LVM for patient to call back and reschedule appt

## 2017-08-03 ENCOUNTER — Observation Stay
Admission: EM | Admit: 2017-08-03 | Discharge: 2017-08-04 | Disposition: A | Discharge status: Home / Self Care | Payer: Medicare Other | Attending: Family Medicine | Admitting: Family Medicine

## 2017-08-03 ENCOUNTER — Other Ambulatory Visit: Payer: Self-pay

## 2017-08-03 ENCOUNTER — Encounter: Payer: Self-pay | Admitting: Emergency Medicine

## 2017-08-03 ENCOUNTER — Emergency Department: Payer: Medicare Other

## 2017-08-03 DIAGNOSIS — Z79899 Other long term (current) drug therapy: Secondary | ICD-10-CM | POA: Diagnosis not present

## 2017-08-03 DIAGNOSIS — I951 Orthostatic hypotension: Secondary | ICD-10-CM | POA: Insufficient documentation

## 2017-08-03 DIAGNOSIS — R42 Dizziness and giddiness: Secondary | ICD-10-CM | POA: Diagnosis not present

## 2017-08-03 DIAGNOSIS — G809 Cerebral palsy, unspecified: Secondary | ICD-10-CM | POA: Diagnosis not present

## 2017-08-03 DIAGNOSIS — N3001 Acute cystitis with hematuria: Secondary | ICD-10-CM | POA: Diagnosis present

## 2017-08-03 DIAGNOSIS — Z87442 Personal history of urinary calculi: Secondary | ICD-10-CM | POA: Insufficient documentation

## 2017-08-03 DIAGNOSIS — G40909 Epilepsy, unspecified, not intractable, without status epilepticus: Secondary | ICD-10-CM | POA: Insufficient documentation

## 2017-08-03 DIAGNOSIS — Z7982 Long term (current) use of aspirin: Secondary | ICD-10-CM | POA: Diagnosis not present

## 2017-08-03 DIAGNOSIS — Z8249 Family history of ischemic heart disease and other diseases of the circulatory system: Secondary | ICD-10-CM | POA: Diagnosis not present

## 2017-08-03 DIAGNOSIS — N138 Other obstructive and reflux uropathy: Secondary | ICD-10-CM | POA: Insufficient documentation

## 2017-08-03 DIAGNOSIS — H6690 Otitis media, unspecified, unspecified ear: Secondary | ICD-10-CM | POA: Diagnosis not present

## 2017-08-03 DIAGNOSIS — G4730 Sleep apnea, unspecified: Secondary | ICD-10-CM | POA: Diagnosis not present

## 2017-08-03 DIAGNOSIS — N401 Enlarged prostate with lower urinary tract symptoms: Secondary | ICD-10-CM | POA: Diagnosis not present

## 2017-08-03 DIAGNOSIS — I6529 Occlusion and stenosis of unspecified carotid artery: Secondary | ICD-10-CM | POA: Insufficient documentation

## 2017-08-03 LAB — URINALYSIS, COMPLETE (UACMP) WITH MICROSCOPIC
Bilirubin Urine: NEGATIVE
Glucose, UA: NEGATIVE mg/dL
Ketones, ur: 5 mg/dL — AB
Nitrite: NEGATIVE
Protein, ur: NEGATIVE mg/dL
Specific Gravity, Urine: 1.012 (ref 1.005–1.030)
Squamous Epithelial / LPF: NONE SEEN
pH: 5 (ref 5.0–8.0)

## 2017-08-03 LAB — TROPONIN I: Troponin I: <0.03 ng/mL (ref <0.03)

## 2017-08-03 LAB — COMPREHENSIVE METABOLIC PANEL
ALT: 13 U/L — ABNORMAL LOW (ref 17–63)
AST: 16 U/L (ref 15–41)
Albumin: 4 g/dL (ref 3.5–5.0)
Alkaline Phosphatase: 92 U/L (ref 38–126)
Anion gap: 10 (ref 5–15)
BUN: 19 mg/dL (ref 6–20)
CO2: 22 mmol/L (ref 22–32)
Calcium: 9.7 mg/dL (ref 8.9–10.3)
Chloride: 109 mmol/L (ref 101–111)
Creatinine, Ser: 0.9 mg/dL (ref 0.61–1.24)
GFR calc Af Amer: >60 mL/min (ref >60)
GFR calc non Af Amer: >60 mL/min (ref >60)
Glucose, Bld: 113 mg/dL — ABNORMAL HIGH (ref 65–99)
Potassium: 4.1 mmol/L (ref 3.5–5.1)
Sodium: 141 mmol/L (ref 135–145)
Total Bilirubin: 0.5 mg/dL (ref 0.3–1.2)
Total Protein: 7.7 g/dL (ref 6.5–8.1)

## 2017-08-03 LAB — CBC
HCT: 41.3 % (ref 40.0–52.0)
Hemoglobin: 14.3 g/dL (ref 13.0–18.0)
MCH: 34.8 pg — ABNORMAL HIGH (ref 26.0–34.0)
MCHC: 34.6 g/dL (ref 32.0–36.0)
MCV: 100.3 fL — ABNORMAL HIGH (ref 80.0–100.0)
Platelets: 386 10*3/uL (ref 150–440)
RBC: 4.11 MIL/uL — ABNORMAL LOW (ref 4.40–5.90)
RDW: 12.6 % (ref 11.5–14.5)
WBC: 8.1 10*3/uL (ref 3.8–10.6)

## 2017-08-03 LAB — PHENYTOIN LEVEL, TOTAL: Phenytoin Lvl: 7 ug/mL — ABNORMAL LOW (ref 10.0–20.0)

## 2017-08-03 MED ORDER — ASPIRIN EC 81 MG PO TBEC
81.0000 mg | DELAYED_RELEASE_TABLET | Freq: Every day | ORAL | Status: DC
  Administered 2017-08-04: 81 mg via ORAL
  Filled 2017-08-03: qty 1

## 2017-08-03 MED ORDER — ENOXAPARIN SODIUM 40 MG/0.4ML ~~LOC~~ SOLN
40.0000 mg | SUBCUTANEOUS | Status: DC
  Administered 2017-08-03: 23:00:00 40 mg via SUBCUTANEOUS
  Filled 2017-08-03: qty 0.4

## 2017-08-03 MED ORDER — MECLIZINE HCL 12.5 MG PO TABS
12.5000 mg | ORAL_TABLET | Freq: Three times a day (TID) | ORAL | Status: DC
  Administered 2017-08-03 – 2017-08-04 (×2): 12.5 mg via ORAL
  Filled 2017-08-03 (×2): qty 1
  Filled 2017-08-03: qty 0.5
  Filled 2017-08-03: qty 1

## 2017-08-03 MED ORDER — CEPHALEXIN 500 MG PO CAPS
500.0000 mg | ORAL_CAPSULE | Freq: Once | ORAL | Status: AC
  Administered 2017-08-03: 500 mg via ORAL
  Filled 2017-08-03: qty 1

## 2017-08-03 MED ORDER — MECLIZINE HCL 25 MG PO TABS
25.0000 mg | ORAL_TABLET | Freq: Once | ORAL | Status: AC
  Administered 2017-08-03: 25 mg via ORAL
  Filled 2017-08-03: qty 1

## 2017-08-03 MED ORDER — LEVETIRACETAM 750 MG PO TABS
750.0000 mg | ORAL_TABLET | Freq: Two times a day (BID) | ORAL | Status: DC
  Administered 2017-08-03 – 2017-08-04 (×2): 750 mg via ORAL
  Filled 2017-08-03 (×3): qty 1

## 2017-08-03 MED ORDER — TAMSULOSIN HCL 0.4 MG PO CAPS: 0.4000 mg | ORAL_CAPSULE | Freq: Every day | ORAL | Status: DC

## 2017-08-03 MED ORDER — SODIUM CHLORIDE 0.9 % IV SOLN
INTRAVENOUS | Status: DC
  Administered 2017-08-03: 21:00:00 via INTRAVENOUS

## 2017-08-03 MED ORDER — RISAQUAD PO CAPS
1.0000 | ORAL_CAPSULE | Freq: Every day | ORAL | Status: DC
  Administered 2017-08-04: 1 via ORAL
  Filled 2017-08-03: qty 1

## 2017-08-03 MED ORDER — CALCIUM CARBONATE ANTACID 500 MG PO CHEW
1.5000 | CHEWABLE_TABLET | Freq: Every day | ORAL | Status: DC
  Administered 2017-08-04: 11:00:00 300 mg via ORAL
  Filled 2017-08-03: qty 2

## 2017-08-03 MED ORDER — SODIUM CHLORIDE 0.9 % IV SOLN
3.0000 g | Freq: Four times a day (QID) | INTRAVENOUS | Status: DC
  Administered 2017-08-03 – 2017-08-04 (×4): 3 g via INTRAVENOUS
  Filled 2017-08-03 (×8): qty 3

## 2017-08-03 MED ORDER — FINASTERIDE 5 MG PO TABS
5.0000 mg | ORAL_TABLET | Freq: Every day | ORAL | Status: DC
  Administered 2017-08-04: 11:00:00 5 mg via ORAL
  Filled 2017-08-03: qty 1

## 2017-08-03 MED ORDER — VITAMIN D 1000 UNITS PO TABS
5000.0000 [IU] | ORAL_TABLET | Freq: Every day | ORAL | Status: DC
  Administered 2017-08-04: 11:00:00 5000 [IU] via ORAL
  Filled 2017-08-03: qty 5

## 2017-08-03 MED ORDER — PHENYTOIN SODIUM EXTENDED 100 MG PO CAPS: 300.0000 mg | ORAL_CAPSULE | Freq: Every day | ORAL | Status: DC

## 2017-08-03 MED ORDER — PHENYTOIN SODIUM EXTENDED 100 MG PO CAPS
300.0000 mg | ORAL_CAPSULE | Freq: Every day | ORAL | Status: DC
  Administered 2017-08-03: 23:00:00 300 mg via ORAL
  Filled 2017-08-03 (×2): qty 3

## 2017-08-03 MED ORDER — ACETAMINOPHEN 650 MG RE SUPP: 650.0000 mg | Freq: Four times a day (QID) | RECTAL | Status: DC | PRN

## 2017-08-03 MED ORDER — DIAZEPAM 2 MG PO TABS
2.0000 mg | ORAL_TABLET | Freq: Once | ORAL | Status: AC
  Administered 2017-08-03: 2 mg via ORAL
  Filled 2017-08-03: qty 1

## 2017-08-03 MED ORDER — SODIUM CHLORIDE 0.9 % IV BOLUS (SEPSIS)
1000.0000 mL | Freq: Once | INTRAVENOUS | Status: AC
  Administered 2017-08-03: 1000 mL via INTRAVENOUS

## 2017-08-03 MED ORDER — ACETAMINOPHEN 325 MG PO TABS: 650.0000 mg | ORAL_TABLET | Freq: Four times a day (QID) | ORAL | Status: DC | PRN

## 2017-08-03 NOTE — ED Notes (Signed)
Spoke with pt about wait times and what to expect next. Advised pt that I am available for further questions if needed.  

## 2017-08-03 NOTE — ED Notes (Addendum)
Unable to give report until 1930 d/t shift time.

## 2017-08-03 NOTE — ED Provider Notes (Signed)
[facility] Emergency Department Provider Note  ____________________________________________  Time seen: Approximately 3:48 PM  I have reviewed the triage vital signs and the nursing notes.   HISTORY  Chief Complaint Dizziness   HPI Dylan Sullivan is a 72 y.o. male with a history of cerebral palsy, right hemiplegia, epilepsy who presents for evaluation of dizziness. Patient reports that he was in his usual state of health until he woke up this morning. When he tried to sit up on the bed he developed dizziness that he describes as feeling off balance, no room spinning, no lightheadedness. Dizziness was moderate in intensity and he had difficulty walking at home. His symptoms resolved when he lays back in bed. He reports that the symptoms were on and off throughout the day today. Currently symptoms are mild. Patient reports one similar episodein January 2018 for which she was admitted with similar symptoms. At that time he underwent CT head and MRI which were both negative. His Keppra levels were found to be elevated and the culprit of his vertigo. Since then his Keppra has been reduced and patient has not had any other episodes until this morning. He denies facial droop, slurred speech, difficulty finding words, unilateral weakness or numbness that is unchanged from his baseline. He denies headache, chest pain, palpitations. He denies any recent illness, no congestion, runny nose, cough, fever or chills. No ear pain or tinnitus.  Past Medical History:  Diagnosis Date  . BPH (benign prostatic hyperplasia)   . Cerebral palsy ([place])   . Elevated PSA   . Hematuria   . Hemiplegia ([place]) 09/13/2011   Overview:  Upper extremity most affected   . Hydronephrosis   . Kidney stone   . Nocturia   . Obstructive apnea 12/29/2014  . Osteoporosis   . 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 Seizures ([place])   . Sleep  apnea   . Urinary retention     Patient Active Problem List   Diagnosis Date Noted  . Carotid artery stenosis 11/11/2016  . 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 BPH with obstruction/lower urinary tract symptoms 04/20/2015  . History of elevated PSA 04/20/2015  . BP (high blood pressure) 04/16/2015  . Obstructive apnea 12/29/2014  . Encounter for therapeutic drug level monitoring 09/25/2014  . Hemiplegia ([place]) 09/13/2011  . 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 Breath shortness 03/13/2008    Past Surgical History:  Procedure Laterality Date  . APPENDECTOMY    . arm surgery Right    child  . BRAIN SURGERY    . LEG SURGERY Right    child  . TONSILLECTOMY      Prior to Admission medications   Medication Sig Start Date End Date Taking? Authorizing Provider  acidophilus (RISAQUAD) CAPS capsule Take 1 capsule by mouth daily.   Yes [provider]  aspirin EC 81 MG tablet Take 81 mg by mouth daily.   Yes [provider]  calcium carbonate (TUMS EX) 750 MG chewable tablet Chew 1 tablet by mouth daily.    Yes [provider]  CAPSICUM, CAYENNE, PO Take 1 tablet by mouth daily.    Yes [provider]  cholecalciferol (VITAMIN D) 1000 units tablet Take 5,000 Units by mouth daily.   Yes [provider]  CRANBERRY PO Take 1 capsule by mouth daily.   Yes [provider]  finasteride (PROSCAR) 5 MG  tablet Take 1 tablet (5 mg total) by mouth daily. 10/18/16  Yes McGowan, Larene Beach A, PA-C  Flaxseed, Linseed, (FLAX SEED OIL PO) Take 1 capsule by mouth daily.   Yes [provider]  hydrochlorothiazide (HYDRODIURIL) 25 MG tablet Take 25 mg by mouth daily.   Yes [provider]  levETIRAcetam (KEPPRA) 750 MG tablet Take 1 tablet (750 mg total) by mouth 2 (two) times daily. 06/18/16  Yes Fritzi Mandes, MD  phenytoin (DILANTIN) 100 MG ER capsule Take 300 mg by mouth daily.   Yes  [provider]  tamsulosin (FLOMAX) 0.4 MG CAPS capsule TAKE ONE CAPSULE BY MOUTH EVERY DAY 02/17/17  Yes McGowan, Larene Beach A, PA-C  Turmeric 500 MG CAPS Take 1 capsule by mouth daily.   Yes [provider]  vitamin C (ASCORBIC ACID) 500 MG tablet Take 1,000 mg by mouth daily.   Yes [provider]  meclizine (ANTIVERT) 12.5 MG tablet Take 1 tablet (12.5 mg total) by mouth 2 (two) times daily as needed for dizziness. Patient not taking: Reported on 07/10/2017 06/17/16   Fritzi Mandes, MD    Allergies Patient has no known allergies.  Family History  Problem Relation Age of Onset  . Breast cancer Maternal Aunt   . Prostate cancer Neg Hx   . Bladder Cancer Neg Hx   . Kidney cancer Neg Hx     Social History Social History   Tobacco Use  . Smoking status: Never Smoker  . Smokeless tobacco: Never Used  Substance Use Topics  . Alcohol use: No    Alcohol/week: 0.0 oz  . Drug use: No    Review of Systems  Constitutional: Negative for fever. Eyes: Negative for visual changes. ENT: Negative for sore throat. Neck: No neck pain  Cardiovascular: Negative for chest pain. Respiratory: Negative for shortness of breath. Gastrointestinal: Negative for abdominal pain, vomiting or diarrhea. Genitourinary: Negative for dysuria. Musculoskeletal: Negative for back pain. Skin: Negative for rash. Neurological: Negative for headaches, weakness or numbness. + dizziness Psych: No SI or HI  ____________________________________________   PHYSICAL EXAM:  VITAL SIGNS: ED Triage Vitals  Enc Vitals Group     BP 08/03/17 1130 (!) 157/63     Pulse Rate 08/03/17 1130 77     Resp 08/03/17 1130 18     Temp 08/03/17 1130 97.6 F (36.4 C)     Temp Source 08/03/17 1130 Oral     SpO2 08/03/17 1130 99 %     Weight 08/03/17 1131 161 lb (73 kg)     Height --      Head Circumference --      Peak Flow --      Pain Score --      Pain Loc --      Pain Edu? --      Excl. in [place]? --      Constitutional: Alert and oriented. Well appearing and in no apparent distress. HEENT:      Head: Normocephalic and atraumatic.         Eyes: Conjunctivae are normal. Sclera is non-icteric.       Mouth/Throat: Mucous membranes are moist.       Ear: Bilateral TMs visualized with no cerumen impaction or other abnormalities.      Neck: Supple with no signs of meningismus. No carotid bruits Cardiovascular: Regular rate and rhythm. No murmurs, gallops, or rubs. 2+ symmetrical distal pulses are present in all extremities. No JVD. Respiratory: Normal respiratory effort. Lungs are clear  to auscultation bilaterally. No wheezes, crackles, or rhonchi.  Gastrointestinal: Soft, non tender, and non distended with positive bowel sounds. No rebound or guarding. Musculoskeletal: Nontender with normal range of motion in all extremities. No edema, cyanosis, or erythema of extremities. Neurologic: Normal speech and language. Face is symmetric. Normal strength on RL/LU/LLE, 4/5 strength on RU which is baseline, no pronator drift, intact visual fields, EOMI, PERRL, no dysmetria, no nystagmus Skin: Skin is warm, dry and intact. No rash noted. Psychiatric: Mood and affect are normal. Speech and behavior are normal.  ____________________________________________   LABS (all labs ordered are listed, but only abnormal results are displayed)  Labs Reviewed  CBC - Abnormal; Notable for the following components:      Result Value   RBC 4.11 (*)    MCV 100.3 (*)    MCH 34.8 (*)    All other components within normal limits  COMPREHENSIVE METABOLIC PANEL - Abnormal; Notable for the following components:   Glucose, Bld 113 (*)    ALT 13 (*)    All other components within normal limits  URINALYSIS, COMPLETE (UACMP) WITH MICROSCOPIC - Abnormal; Notable for the following components:   Color, Urine YELLOW (*)    APPearance HAZY (*)    Hgb urine dipstick SMALL (*)    Ketones, ur 5 (*)    Leukocytes, UA MODERATE  (*)    Bacteria, UA RARE (*)    All other components within normal limits  URINE CULTURE  TROPONIN I  LEVETIRACETAM LEVEL   ____________________________________________  EKG  ED ECG REPORT I, Rudene Re, the attending physician, personally viewed and interpreted this ECG.  Normal sinus rhythm, normal intervals, LAD, no STE or depressions, no evidence of HOCM, AV block, delta wave, ARVD, prolonged QTc, WPW, or [place]. Unchanged from prior from January 2018   ____________________________________________  RADIOLOGY  I have personally reviewed the images performed during this visit and I agree with the Radiologist's read.   Interpretation by Radiologist:  Ct Head Wo Contrast  Result Date: 08/03/2017 CLINICAL DATA:  Dizziness beginning this morning. History of cerebral palsy and baseline right-sided weakness. Previous brain surgery for seizures. EXAM: CT HEAD WITHOUT CONTRAST TECHNIQUE: Contiguous axial images were obtained from the base of the skull through the vertex without intravenous contrast. COMPARISON:  MRI brain 06/16/2016 FINDINGS: Brain: Chronic stable left hemispheric encephalomalacia and dilatation of the left lateral ventricle compatible with postsurgical changes. No mass, mass effect, shift of midline structures or acute hemorrhage. No evidence of acute infarction. Surgical sutures/clips along the interhemispheric fissure. Vascular: No hyperdense vessel or unexpected calcification. Skull: Multiple postsurgical changes left frontoparietal skull. Sinuses/Orbits: No acute finding. Other: None. IMPRESSION: No acute findings. Stable chronic postsurgical changes of the left hemisphere. Electronically Signed   By: Marin Olp M.D.   On: 08/03/2017 15:58      ____________________________________________   PROCEDURES  Procedure(s) performed: None Procedures Critical Care performed:  None ____________________________________________   INITIAL IMPRESSION / ASSESSMENT  AND PLAN / ED COURSE  72 y.o. male with a history of cerebral palsy, right hemiplegia, epilepsy who presents for evaluation of dizziness/ positional vertigo since this am. Symptoms are intermittent and positional, patient is neurological exam is unchanged fromline, no nystagmus, normal vital signs. Labs show no evidence of electrolyte abnormalities or dehydration. UA pending to rule out UTI. Keppra level has been sent however this is a send out and we will have the results until tomorrow. I will give IV fluids, meclizine, and by mouth Valium for  peripheral vertigo. We'll send patient for a head CT. If patient's symptoms resolve and CT is negative plan to discharge patient home on meclizine until Keppra levels are back.  Clinical Course as of Aug 03 1737  Thu Aug 03, 2017  1733 UA positive for UTI. Patient has normal WBC and no fever, no signs of sepsis. Will treat with keflex. Attempted to ambulate patient and patient is very unsteady with no improvement after meclizine and valium. Will admit to Hospitalist.  [CV]    Clinical Course User Index [CV] Alfred Levins [place], MD     As part of my medical decision making, I reviewed the following data within the [facility] notes reviewed and incorporated, Labs reviewed , EKG interpreted , Old EKG reviewed, Radiograph reviewed , Discussed with admitting physician , Notes from prior ED visits and [place] Controlled Substance Database    Pertinent labs & imaging results that were available during my care of the patient were reviewed by me and considered in my medical decision making (see chart for details).    ____________________________________________   FINAL CLINICAL IMPRESSION(S) / ED DIAGNOSES  Final diagnoses:  Vertigo  Acute cystitis with hematuria      NEW MEDICATIONS STARTED DURING THIS VISIT:  ED Discharge Orders    None       Note:  This document was prepared using Dragon voice recognition software and may  include unintentional dictation errors.    Alfred Levins, [place], MD 08/03/17 [phone]

## 2017-08-03 NOTE — ED Triage Notes (Signed)
PT arrived via ambulance with complaints of dizziness starting this morning. Pt has cerebral palsy and has right sided weakness at baseline. Pt denies any pain or weakness past baseline. Pt states "this has happened one time before and it was because of my keppra dosage." Pt alert and oriented x 4. Pt denies dizziness when sitting but states "it's when I change position."

## 2017-08-03 NOTE — Progress Notes (Signed)
Pharmacy Antibiotic Note  Dylan Sullivan is a 72 y.o. male admitted on 08/03/2017 with dizziness ordered antibioticsfor otitis media and UTI.  Pharmacy has been consulted for Unasyn dosing.  Plan: Unasyn 3 g iv q 6 hours.   Weight: 161 lb (73 kg)  Temp (24hrs), Avg:97.6 F (36.4 C), Min:97.6 F (36.4 C), Max:97.6 F (36.4 C)  Recent Labs  Lab 08/03/17 1135  WBC 8.1  CREATININE 0.90    Estimated Creatinine Clearance: 75.3 mL/min (by C-G formula based on SCr of 0.9 mg/dL).    No Known Allergies  Antimicrobials this admission: Cephalexin 2/21 Unasyn 2/21 >>   Dose adjustments this admission:   Microbiology results: 2/21 UCx: sent    Thank you for allowing pharmacy to be a part of this patient's care.  Napoleon Form 08/03/2017 6:33 PM

## 2017-08-03 NOTE — H&P (Addendum)
[facility] NAME: Dylan Sullivan    MR#:  [id number]  DATE OF BIRTH:  04-07-1946  DATE OF ADMISSION:  08/03/2017  PRIMARY CARE PHYSICIAN: Casilda Carls, MD   REQUESTING/REFERRING PHYSICIAN: Dr. Gonzella Lex  CHIEF COMPLAINT:   Chief Complaint  Patient presents with  . Dizziness    HISTORY OF PRESENT ILLNESS:  Dylan Sullivan  is a 72 y.o. male presents to the ER with dizziness from when he woke up this morning.  He states when he got up this morning he felt like he was drunk and it was hard to stand up and walk.  Felt like he was spinning not the room.  He states that this happened last year and it was worse last year.  At that time they lowered down his Keppra and that may have been the etiology.  In the ER, he was unsteady with his walking in the ER physician felt to be best to bring him in.  Patient also found to have a urinary infection.  PAST MEDICAL HISTORY:   Past Medical History:  Diagnosis Date  . BPH (benign prostatic hyperplasia)   . Cerebral palsy ([place])   . Elevated PSA   . Hematuria   . Hemiplegia ([place]) 09/13/2011   Overview:  Upper extremity most affected   . Hydronephrosis   . Kidney stone   . Nocturia   . Obstructive apnea 12/29/2014  . Osteoporosis   . 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 Seizures ([place])   . Sleep apnea   . Urinary retention     PAST SURGICAL HISTORY:   Past Surgical History:  Procedure Laterality Date  . APPENDECTOMY    . arm surgery Right    child  . BRAIN SURGERY    . LEG SURGERY Right    child  . TONSILLECTOMY      SOCIAL HISTORY:   Social History   Tobacco Use  . Smoking status: Never Smoker  . Smokeless tobacco: Never Used  Substance Use Topics  . Alcohol use: No    Alcohol/week: 0.0 oz    FAMILY HISTORY:   Family History  Problem Relation Age of Onset  . Breast cancer Maternal  Aunt   . Dementia Mother   . CAD Father   . Prostate cancer Neg Hx   . Bladder Cancer Neg Hx   . Kidney cancer Neg Hx     DRUG ALLERGIES:  No Known Allergies  REVIEW OF SYSTEMS:  CONSTITUTIONAL: No fever, fatigue or weakness.  EYES: No blurred or double vision.  Wears glasses EARS, NOSE, AND THROAT: No tinnitus or ear pain. No sore throat RESPIRATORY: No cough, shortness of breath, wheezing or hemoptysis.  CARDIOVASCULAR: No chest pain, orthopnea, edema.  GASTROINTESTINAL: No nausea, vomiting, diarrhea or abdominal pain. No blood in bowel movements GENITOURINARY: No dysuria, hematuria.  ENDOCRINE: No polyuria, nocturia,  HEMATOLOGY: No anemia, easy bruising or bleeding SKIN: No rash or lesion. MUSCULOSKELETAL: No joint pain or arthritis.   NEUROLOGIC: No tingling, numbness, weakness.  History of blackouts in the past PSYCHIATRY: No anxiety or depression.   MEDICATIONS AT HOME:   Prior to Admission medications   Medication Sig Start Date End Date Taking? Authorizing Provider  acidophilus (RISAQUAD) CAPS capsule Take 1 capsule by mouth daily.   Yes [provider]  aspirin EC 81 MG tablet Take 81 mg by mouth daily.   Yes  [provider]  calcium carbonate (TUMS EX) 750 MG chewable tablet Chew 1 tablet by mouth daily.    Yes [provider]  CAPSICUM, CAYENNE, PO Take 1 tablet by mouth daily.    Yes [provider]  cholecalciferol (VITAMIN D) 1000 units tablet Take 5,000 Units by mouth daily.   Yes [provider]  CRANBERRY PO Take 1 capsule by mouth daily.   Yes [provider]  finasteride (PROSCAR) 5 MG tablet Take 1 tablet (5 mg total) by mouth daily. 10/18/16  Yes McGowan, Larene Beach A, PA-C  Flaxseed, Linseed, (FLAX SEED OIL PO) Take 1 capsule by mouth daily.   Yes [provider]  hydrochlorothiazide (HYDRODIURIL) 25 MG tablet Take 25 mg by mouth daily.   Yes [provider]  levETIRAcetam (KEPPRA) 750 MG  tablet Take 1 tablet (750 mg total) by mouth 2 (two) times daily. 06/18/16  Yes Fritzi Mandes, MD  phenytoin (DILANTIN) 100 MG ER capsule Take 300 mg by mouth daily.   Yes [provider]  tamsulosin (FLOMAX) 0.4 MG CAPS capsule TAKE ONE CAPSULE BY MOUTH EVERY DAY 02/17/17  Yes McGowan, Larene Beach A, PA-C  Turmeric 500 MG CAPS Take 1 capsule by mouth daily.   Yes [provider]  vitamin C (ASCORBIC ACID) 500 MG tablet Take 1,000 mg by mouth daily.   Yes [provider]      VITAL SIGNS:  Blood pressure (!) 174/84, pulse 77, temperature 97.6 F (36.4 C), temperature source Oral, resp. rate 18, weight 73 kg (161 lb), SpO2 99 %.  PHYSICAL EXAMINATION:  GENERAL:  72 y.o.-year-old patient lying in the bed with no acute distress.  EYES: Pupils equal, round, reactive to light and accommodation. No scleral icterus. Extraocular muscles intact.  HEENT: Head atraumatic, normocephalic. Oropharynx and nasopharynx clear.  Left tympanic membrane bulging and erythematous.  Right tympanic membrane cloudy NECK:  Supple, no jugular venous distention. No thyroid enlargement, no tenderness.  LUNGS: Normal breath sounds bilaterally, no wheezing, rales,rhonchi or crepitation. No use of accessory muscles of respiration.  CARDIOVASCULAR: S1, S2 normal. No murmurs, rubs, or gallops.  ABDOMEN: Soft, nontender, nondistended. Bowel sounds present. No organomegaly or mass.  EXTREMITIES: No pedal edema, cyanosis, or clubbing.  NEUROLOGIC: Cranial nerves II through XII are intact. Muscle strength 5/5 in all extremities. Sensation intact. Gait not checked.  PSYCHIATRIC: The patient is alert and oriented x 3.  SKIN: No rash, lesion, or ulcer.   LABORATORY PANEL:   CBC Recent Labs  Lab 08/03/17 1135  WBC 8.1  HGB 14.3  HCT 41.3  PLT 386   ------------------------------------------------------------------------------------------------------------------  Chemistries  Recent Labs  Lab  08/03/17 1135  NA 141  K 4.1  CL 109  CO2 22  GLUCOSE 113*  BUN 19  CREATININE 0.90  CALCIUM 9.7  AST 16  ALT 13*  ALKPHOS 92  BILITOT 0.5   ------------------------------------------------------------------------------------------------------------------  Cardiac Enzymes Recent Labs  Lab 08/03/17 1135  TROPONINI <0.03   ------------------------------------------------------------------------------------------------------------------  RADIOLOGY:  Ct Head Wo Contrast  Result Date: 08/03/2017 CLINICAL DATA:  Dizziness beginning this morning. History of cerebral palsy and baseline right-sided weakness. Previous brain surgery for seizures. EXAM: CT HEAD WITHOUT CONTRAST TECHNIQUE: Contiguous axial images were obtained from the base of the skull through the vertex without intravenous contrast. COMPARISON:  MRI brain 06/16/2016 FINDINGS: Brain: Chronic stable left hemispheric encephalomalacia and dilatation of the left lateral ventricle compatible with postsurgical changes. No mass, mass effect, shift of midline structures or acute  hemorrhage. No evidence of acute infarction. Surgical sutures/clips along the interhemispheric fissure. Vascular: No hyperdense vessel or unexpected calcification. Skull: Multiple postsurgical changes left frontoparietal skull. Sinuses/Orbits: No acute finding. Other: None. IMPRESSION: No acute findings. Stable chronic postsurgical changes of the left hemisphere. Electronically Signed   By: Marin Olp M.D.   On: 08/03/2017 15:58    EKG:   Normal sinus rhythm 83 bpm, left axis deviation.  This was reviewed and interpreted by me  IMPRESSION AND PLAN:   1.  Vertigo and dizziness.  Otitis media and orthostatic hypotension.  IV fluid hydration, IV Unasyn.  Physical therapy evaluation.  Hold Flomax and hydrochlorothiazide.  Continue to check orthostatic vital signs tomorrow after hydration.  Meclizine ordered. 2.  Acute cystitis with hematuria.  Urine culture  ordered.  Empiric antibiotics. 3.  Seizure history on Keppra and Dilantin.  Level sent off for both of these medications.  I am hesitant on decreasing the Keppra dose at this point. 4.  History of cerebral palsy, right hemi-plegia. 5.  BPH hold Flomax with orthostatic hypotension and give finasteride.  Will give a bladder scan after urination.  EKG personally reviewed by me, chest x-ray personally reviewed by me laboratory data reviewed by me.  All the records are reviewed and case discussed with ED provider. Management plans discussed with the patient, and he is in agreement.  CODE STATUS: Full code  TOTAL TIME TAKING CARE OF THIS PATIENT: 50 minutes.    Loletha Grayer M.D on 08/03/2017 at 6:23 PM  Between 7am to 6pm - Pager - [phone]  After 6pm call admission pager [phone]  [facility]  [phone]  CC: Primary care physician; Casilda Carls, MD

## 2017-08-03 NOTE — ED Notes (Signed)
EMS pt to triage , awoke with dizziness this AM , ambulatory at scene, A&O x4

## 2017-08-04 DIAGNOSIS — R42 Dizziness and giddiness: Secondary | ICD-10-CM | POA: Diagnosis not present

## 2017-08-04 LAB — CBC
HCT: 37.4 % — ABNORMAL LOW (ref 40.0–52.0)
Hemoglobin: 13.1 g/dL (ref 13.0–18.0)
MCH: 34.6 pg — ABNORMAL HIGH (ref 26.0–34.0)
MCHC: 35 g/dL (ref 32.0–36.0)
MCV: 98.8 fL (ref 80.0–100.0)
Platelets: 348 10*3/uL (ref 150–440)
RBC: 3.78 MIL/uL — ABNORMAL LOW (ref 4.40–5.90)
RDW: 12.7 % (ref 11.5–14.5)
WBC: 6.1 10*3/uL (ref 3.8–10.6)

## 2017-08-04 LAB — BASIC METABOLIC PANEL
Anion gap: 7 (ref 5–15)
BUN: 15 mg/dL (ref 6–20)
CO2: 23 mmol/L (ref 22–32)
Calcium: 9.2 mg/dL (ref 8.9–10.3)
Chloride: 111 mmol/L (ref 101–111)
Creatinine, Ser: 0.91 mg/dL (ref 0.61–1.24)
GFR calc Af Amer: >60 mL/min (ref >60)
GFR calc non Af Amer: >60 mL/min (ref >60)
Glucose, Bld: 100 mg/dL — ABNORMAL HIGH (ref 65–99)
Potassium: 3.8 mmol/L (ref 3.5–5.1)
Sodium: 141 mmol/L (ref 135–145)

## 2017-08-04 MED ORDER — MIDODRINE HCL 10 MG PO TABS: 10.0000 mg | ORAL_TABLET | Freq: Three times a day (TID) | ORAL | 0 refills | Status: DC

## 2017-08-04 MED ORDER — MECLIZINE HCL 12.5 MG PO TABS: 12.5000 mg | ORAL_TABLET | Freq: Three times a day (TID) | ORAL | 0 refills | Status: DC

## 2017-08-04 MED ORDER — CEFDINIR 300 MG PO CAPS: 300.0000 mg | ORAL_CAPSULE | Freq: Two times a day (BID) | ORAL | 0 refills | Status: DC

## 2017-08-04 MED ORDER — MIDODRINE HCL 5 MG PO TABS
10.0000 mg | ORAL_TABLET | Freq: Three times a day (TID) | ORAL | Status: DC
  Administered 2017-08-04: 10 mg via ORAL
  Filled 2017-08-04 (×3): qty 2

## 2017-08-04 NOTE — Progress Notes (Signed)
Pt for discharge  A/o no resp distress. Instructions discussed with pt. meds discussed. presc given and also discussed. Diet/ activity and f/u  Gone over with pt. Pt verbalize understanding of all. Pt  Will  Get dressed  Then call his ride to pick him up.

## 2017-08-04 NOTE — Discharge Instructions (Signed)

## 2017-08-04 NOTE — Progress Notes (Signed)
Pharmacy Antibiotic Note  Dylan Sullivan is a 72 y.o. male admitted on 08/03/2017 with dizziness ordered antibioticsfor otitis media and UTI.  Pharmacy has been consulted for ampicillin/sulbactam dosing.  Plan: Ampicillin/sulbactam 3 g IV q 6 hours  Height: 6' (182.9 cm) Weight: 154 lb (69.9 kg) IBW/kg (Calculated) : 77.6  Temp (24hrs), Avg:98.4 F (36.9 C), Min:97.6 F (36.4 C), Max:99.1 F (37.3 C)  Recent Labs  Lab 08/03/17 1135 08/04/17 0447  WBC 8.1 6.1  CREATININE 0.90 0.91    Estimated Creatinine Clearance: 73.6 mL/min (by C-G formula based on SCr of 0.91 mg/dL).    No Known Allergies  Antimicrobials this admission: Cephalexin 2/21 Unasyn 2/21 >>   Dose adjustments this admission:   Microbiology results: 2/21 UCx: sent   Thank you for allowing pharmacy to be a part of this patient's care.  Lenis Noon, PharmD, BCPS Clinical Pharmacist 08/04/2017 9:48 AM

## 2017-08-04 NOTE — Care Management Obs Status (Addendum)
[facility] NOTIFICATION   Patient Details  Name: Dylan Sullivan MRN: [id number] Date of Birth: 1945-06-17   Medicare Observation Status Notification Given:  Yes Patient gave permission for [place], RN 08/04/2017, 11:09 AM

## 2017-08-04 NOTE — Plan of Care (Signed)
  Progressing Urinary Elimination: Signs and symptoms of infection will decrease 08/04/2017 1444 - Progressing by Rowe Robert, RN Education: Knowledge of General Education information will improve 08/04/2017 1444 - Progressing by Rowe Robert, RN Health Behavior/Discharge Planning: Ability to manage health-related needs will improve 08/04/2017 1444 - Progressing by Rowe Robert, RN Clinical Measurements: Ability to maintain clinical measurements within normal limits will improve 08/04/2017 1444 - Progressing by Rowe Robert, RN Will remain free from infection 08/04/2017 1444 - Progressing by Rowe Robert, RN Activity: Risk for activity intolerance will decrease 08/04/2017 1444 - Progressing by Rowe Robert, RN Pain Managment: General experience of comfort will improve 08/04/2017 1444 - Progressing by Rowe Robert, RN Safety: Ability to remain free from injury will improve 08/04/2017 1444 - Progressing by Rowe Robert, RN

## 2017-08-04 NOTE — Evaluation (Signed)
Physical Therapy Evaluation Patient Details Name: Dylan Sullivan MRN: [id number] DOB: [birth year] Today's Date: 08/04/2017   History of Present Illness  Pt is O27 y.o.malepresented to the ER with dizziness from when he woke up. He states when he got up he felt like he was drunk and it was hard to stand up and walk. Felt like he was spinning not the room. He stated that this happened last year and it was worse last year. At that time they lowered down his Keppra and that may have been the etiology. In the ER, he was unsteady with his walking in the ER physician felt to be best to bring him in. Patient also found to have a urinary infection.  Assessment includes: vertigo and dizziness, acute cystitis with hematuria, seizure history on Keppra and Dilantin, cerebral palsy with R hemiplegia, and BPH.     Clinical Impression  Pt presented with general weakness to the RUE and RLE as expected per history but was Ind with bed mobility, transfers, and amb 220' without AD.  Pt reported no adverse symptoms during session with SpO2 98-99% throughout session with HR WNL and with appropriate response to activity.  Pt's BP in sitting 134/75 mmHg and 139/69 mmHg in standing.  After therex and amb pt's BP taken again in standing at 136/70 mmHg.  Pt was steady during balance assessment per below.  Pt reports feeling like he is back to his baseline and does not wish to have further PT intervention.  Will complete PT orders at this time but will reassess pt at a future date pending a change in status upon receipt of new PT orders.     Follow Up Recommendations No PT follow up    Equipment Recommendations  None recommended by PT    Recommendations for Other Services       Precautions / Restrictions Precautions Precautions: Fall Restrictions Weight Bearing Restrictions: No      Mobility  Bed Mobility Overal bed mobility: Independent                Transfers Overall transfer level:  Independent               General transfer comment: Good eccentric and concentric control with good stability upon initial stand  Ambulation/Gait Ambulation/Gait assistance: Independent Ambulation Distance (Feet): 220 Feet Assistive device: None Gait Pattern/deviations: Step-through pattern;Decreased step length - right;Decreased step length - left     General Gait Details: Good stability with amb without AD with no adverse symptoms  Stairs Stairs: Yes Stairs assistance: Modified independent (Device/Increase time) Stair Management: One rail Left Number of Stairs: 2 General stair comments: Good stability and control ascending and descending stairs  Wheelchair Mobility    Modified Rankin (Stroke Patients Only)       Balance Overall balance assessment: Independent                                           Pertinent Vitals/Pain Pain Assessment: No/denies pain    Home Living Family/patient expects to be discharged to:: Private residence Living Arrangements: Alone   Type of Home: House Home Access: Stairs to enter Entrance Stairs-Rails: Left Entrance Stairs-Number of Steps: 2 Home Layout: One level Home Equipment: Grab bars - tub/shower;Cane - single point      Prior Function Level of Independence: Independent         Comments: Pt  Ind with amb community distances without AD, Ind with ADLs and IADLs, no fall history     Hand Dominance        Extremity/Trunk Assessment   Upper Extremity Assessment Upper Extremity Assessment: (H/o R sided weakness)    Lower Extremity Assessment Lower Extremity Assessment: (H/o R sided weakness)       Communication   Communication: No difficulties  Cognition Arousal/Alertness: Awake/alert Behavior During Therapy: WFL for tasks assessed/performed Overall Cognitive Status: Within Functional Limits for tasks assessed                                        General Comments  General comments (skin integrity, edema, etc.): Pt steady with feet apart and together with eyes closed and head turns; pt able to maintain balance in semi-tandem with eyes closed.    Exercises Total Joint Exercises Ankle Circles/Pumps: AROM;Both;10 reps Quad Sets: Strengthening;Both;10 reps Gluteal Sets: Strengthening;Both;10 reps Towel Squeeze: Strengthening;Both;10 reps Hip ABduction/ADduction: AROM;Both;5 reps Straight Leg Raises: AROM;Both;5 reps Long Arc Quad: AROM;Both;10 reps Knee Flexion: AROM;Both;10 reps Marching in Standing: AROM;Both;10 reps   Assessment/Plan    PT Assessment Patent does not need any further PT services  PT Problem List         PT Treatment Interventions      PT Goals (Current goals can be found in the Care Plan section)  Acute Rehab PT Goals PT Goal Formulation: All assessment and education complete, DC therapy    Frequency     Barriers to discharge        Co-evaluation               AM-PAC PT "6 Clicks" Daily Activity  Outcome Measure Difficulty turning over in bed (including adjusting bedclothes, sheets and blankets)?: None Difficulty moving from lying on back to sitting on the side of the bed? : None Difficulty sitting down on and standing up from a chair with arms (e.g., wheelchair, bedside commode, etc,.)?: None Help needed moving to and from a bed to chair (including a wheelchair)?: None Help needed walking in hospital room?: None Help needed climbing 3-5 steps with a railing? : None 6 Click Score: 24    End of Session Equipment Utilized During Treatment: Gait belt Activity Tolerance: Patient tolerated treatment well Patient left: in bed;with bed alarm set;with call bell/phone within reach Nurse Communication: Mobility status PT Visit Diagnosis: Muscle weakness (generalized) (M62.81)    Time: 7121-9758 PT Time Calculation (min) (ACUTE ONLY): 35 min   Charges:   PT Evaluation $PT Eval Low Complexity: 1 Low PT  Treatments $Therapeutic Exercise: 8-22 mins   PT G Codes:        DRoyetta Asal PT, DPT 08/04/17, 12:20 PM

## 2017-08-04 NOTE — Care Management Note (Signed)
Case Management Note  Patient Details  Name: Dylan Sullivan MRN: [id number] Date of Birth: Sep 09, 1945  Subjective/Objective:   Admitted to [facility] under observation status with the diagnosis of vertigo. Lives alone. Friend is Maye Hides [phone]). Last seen Dr. Rosario Jacks on Wednesday. Prescriptions are filled at [facility]. No home Health. No skilled facility. No home oxygen. CPAP in the home x 10 years. Outpatient physical therapy in the past. Takes care of all basic activities of daily living himself, drives. No falls. Good appetite. Threasa Beards friend will transport.                  Action/Plan: No discharge needs identified at this time   Expected Discharge Date:  08/05/17               Expected Discharge Plan:     In-House Referral:   yes  Discharge planning Services   yes  Post Acute Care Choice:    Choice offered to:     DME Arranged:    DME Agency:     HH Arranged:    HH Agency:     Status of Service:     If discussed at [employer] [facility] [employer], dates discussed:    Additional Comments:  Shelbie Ammons, RN MSN CCM Care management [phone] 08/04/2017, 11:01 AM

## 2017-08-05 LAB — LEVETIRACETAM LEVEL: Levetiracetam Lvl: 5.6 ug/mL — ABNORMAL LOW (ref 10.0–40.0)

## 2017-08-05 LAB — URINE CULTURE

## 2017-08-24 NOTE — Discharge Summary (Signed)
[facility] NAME: Dylan Sullivan    MR#:  [id number]  DATE OF BIRTH:  1945-12-15  DATE OF ADMISSION:  08/03/2017 ADMITTING PHYSICIAN: Loletha Grayer, MD  DATE OF DISCHARGE: 08/04/2017  3:56 PM  PRIMARY CARE PHYSICIAN: Casilda Carls, MD    ADMISSION DIAGNOSIS:  Vertigo [R42] Acute cystitis with hematuria [N30.01]  DISCHARGE DIAGNOSIS:  Active Problems:   Vertigo   SECONDARY DIAGNOSIS:   Past Medical History:  Diagnosis Date  . BPH (benign prostatic hyperplasia)   . Cerebral palsy ([place])   . Elevated PSA   . Hematuria   . Hemiplegia ([place]) 09/13/2011   Overview:  Upper extremity most affected   . Hydronephrosis   . Kidney stone   . Nocturia   . Obstructive apnea 12/29/2014  . Osteoporosis   . 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 Seizures ([place])   . Sleep apnea   . Urinary retention     HOSPITAL COURSE:   1.  Vertigo and dizziness.  Otitis media and orthostatic hypotension.  IV fluid hydration, IV Unasyn.  Physical therapy evaluation.  Held Flomax and hydrochlorothiazide. Meclizine ordered. 2.  Acute cystitis with hematuria Empiric antibiotics. 3.  Seizure history  Keppra and Dilantin 4.  History of cerebral palsy right hemi-plegia 5.  BPH  held Flomax   DISCHARGE CONDITIONS:  72 y.o. male presents to the ER with dizziness from when he woke up this morning.  He states when he got up this morning he felt like he was drunk and it was hard to stand up and walk.  Felt like he was spinning not the room.  He states that this happened last year and it was worse last year.  At that time they lowered down his Keppra and that may have been the etiology.  In the ER, he was unsteady with his walking in the ER physician felt to be best to bring him in.  Patient also found to have a urinary infection.  CONSULTS OBTAINED:    DRUG ALLERGIES:  No Known  Allergies  DISCHARGE MEDICATIONS:   Allergies as of 08/04/2017   No Known Allergies     Medication List    STOP taking these medications   hydrochlorothiazide 25 MG tablet Commonly known as:  HYDRODIURIL     TAKE these medications   acidophilus Caps capsule Take 1 capsule by mouth daily.   aspirin EC 81 MG tablet Take 81 mg by mouth daily.   calcium carbonate 750 MG chewable tablet Commonly known as:  TUMS EX Chew 1 tablet by mouth daily.   CAPSICUM (CAYENNE) PO Take 1 tablet by mouth daily.   cefdinir 300 MG capsule Commonly known as:  OMNICEF Take 1 capsule (300 mg total) by mouth 2 (two) times daily.   cholecalciferol 1000 units tablet Commonly known as:  VITAMIN D Take 5,000 Units by mouth daily.   CRANBERRY PO Take 1 capsule by mouth daily.   finasteride 5 MG tablet Commonly known as:  PROSCAR Take 1 tablet (5 mg total) by mouth daily.   FLAX SEED OIL PO Take 1 capsule by mouth daily.   levETIRAcetam 750 MG tablet Commonly known as:  KEPPRA Take 1 tablet (750 mg total) by mouth 2 (two) times daily.   meclizine 12.5 MG tablet Commonly known as:  ANTIVERT Take 1 tablet (12.5 mg total) by mouth 3 (three) times daily.  midodrine 10 MG tablet Commonly known as:  PROAMATINE Take 1 tablet (10 mg total) by mouth 3 (three) times daily with meals.   phenytoin 100 MG ER capsule Commonly known as:  DILANTIN Take 300 mg by mouth daily.   tamsulosin 0.4 MG Caps capsule Commonly known as:  FLOMAX TAKE ONE CAPSULE BY MOUTH EVERY DAY   Turmeric 500 MG Caps Take 1 capsule by mouth daily.   vitamin C 500 MG tablet Commonly known as:  ASCORBIC ACID Take 1,000 mg by mouth daily.        DISCHARGE INSTRUCTIONS:    If you experience worsening of your admission symptoms, develop shortness of breath, life threatening emergency, suicidal or homicidal thoughts you must seek medical attention immediately by calling 911 or calling your MD immediately  if  symptoms less severe.  You Must read complete instructions/literature along with all the possible adverse reactions/side effects for all the Medicines you take and that have been prescribed to you. Take any new Medicines after you have completely understood and accept all the possible adverse reactions/side effects.   Please note  You were cared for by a hospitalist during your hospital stay. If you have any questions about your discharge medications or the care you received while you were in the hospital after you are discharged, you can call the unit and asked to speak with the hospitalist on call if the hospitalist that took care of you is not available. Once you are discharged, your primary care physician will handle any further medical issues. Please note that NO REFILLS for any discharge medications will be authorized once you are discharged, as it is imperative that you return to your primary care physician (or establish a relationship with a primary care physician if you do not have one) for your aftercare needs so that they can reassess your need for medications and monitor your lab values.    Today   CHIEF COMPLAINT:   Chief Complaint  Patient presents with  . Dizziness    HISTORY OF PRESENT ILLNESS:  72 y.o. male presents to the ER with dizziness from when he woke up this morning.  He states when he got up this morning he felt like he was drunk and it was hard to stand up and walk.  Felt like he was spinning not the room.  He states that this happened last year and it was worse last year.  At that time they lowered down his Keppra and that may have been the etiology.  In the ER, he was unsteady with his walking in the ER physician felt to be best to bring him in.  Patient also found to have a urinary infection. VITAL SIGNS:  Blood pressure 127/68, pulse 79, temperature 98.5 F (36.9 C), temperature source Oral, resp. rate 20, height 6' (1.829 m), weight 69.9 kg (154 lb), SpO2 98  %.  I/O:  No intake or output data in the 24 hours ending 08/24/17 0154  PHYSICAL EXAMINATION:  GENERAL:  72 y.o.-year-old patient lying in the bed with no acute distress.  EYES: Pupils equal, round, reactive to light and accommodation. No scleral icterus. Extraocular muscles intact.  HEENT: Head atraumatic, normocephalic. Oropharynx and nasopharynx clear.  NECK:  Supple, no jugular venous distention. No thyroid enlargement, no tenderness.  LUNGS: Normal breath sounds bilaterally, no wheezing, rales,rhonchi or crepitation. No use of accessory muscles of respiration.  CARDIOVASCULAR: S1, S2 normal. No murmurs, rubs, or gallops.  ABDOMEN: Soft, non-tender, non-distended. Bowel sounds  present. No organomegaly or mass.  EXTREMITIES: No pedal edema, cyanosis, or clubbing.  NEUROLOGIC: Cranial nerves II through XII are intact. Muscle strength 5/5 in all extremities. Sensation intact. Gait not checked.  PSYCHIATRIC: The patient is alert and oriented x 3.  SKIN: No obvious rash, lesion, or ulcer.   DATA REVIEW:   CBC No results for input(s): WBC, HGB, HCT, PLT in the last 168 hours.  Chemistries  No results for input(s): NA, K, CL, CO2, GLUCOSE, BUN, CREATININE, CALCIUM, MG, AST, ALT, ALKPHOS, BILITOT in the last 168 hours.  Invalid input(s): GFRCGP  Cardiac Enzymes No results for input(s): TROPONINI in the last 168 hours.  Microbiology Results  Results for orders placed or performed during the hospital encounter of 08/03/17  Urine Culture     Status: Abnormal   Collection Time: 08/03/17  4:40 PM  Result Value Ref Range Status   Specimen Description   Final    URINE, RANDOM Performed at [facility], [address], [place], [place] [address]    Special Requests   Final    NONE Performed at [facility], [address] [place],  [address]    Culture MULTIPLE SPECIES PRESENT, SUGGEST RECOLLECTION (A)  Final   Report Status 08/05/2017 FINAL  Final     RADIOLOGY:  No results found.  EKG:   Orders placed or performed during the hospital encounter of 08/03/17  . EKG 12-Lead  . EKG 12-Lead  . ED EKG  . ED EKG  . EKG      Management plans discussed with the patient, family and they are in agreement.  CODE STATUS:  Code Status History    Date Active Date Inactive Code Status Order ID Comments User Context   08/03/2017 18:21 08/04/2017 19:03 Full Code [id number]  Loletha Grayer, MD ED   06/16/2016 11:27 06/18/2016 13:58 Full Code [id number]  Epifanio Lesches, MD ED      TOTAL TIME TAKING CARE OF THIS PATIENT: 35 minutes.    Avel Peace Salary M.D on 08/24/2017 at 1:54 AM  Between 7am to 6pm - Pager - [phone]  After 6pm go to [email or web address] - password [email or web address] [facility]  Office  [phone]  CC: Primary care physician; Casilda Carls, MD   Note: This dictation was prepared with Dragon dictation along with smaller phrase technology. Any transcriptional errors that result from this process are unintentional.

## 2017-08-28 ENCOUNTER — Telehealth: Payer: Self-pay | Admitting: Gastroenterology

## 2017-08-28 NOTE — Telephone Encounter (Signed)
Left vm with pt to call office and r/s apt Dr. Bonna Gains she will be scoping that day

## 2017-08-30 ENCOUNTER — Other Ambulatory Visit: Payer: Self-pay | Admitting: Ophthalmology

## 2017-08-30 DIAGNOSIS — H53461 Homonymous bilateral field defects, right side: Secondary | ICD-10-CM

## 2017-09-06 ENCOUNTER — Encounter: Payer: Self-pay | Admitting: *Deleted

## 2017-09-06 ENCOUNTER — Other Ambulatory Visit: Payer: Self-pay

## 2017-09-07 ENCOUNTER — Ambulatory Visit: Payer: Medicare Other

## 2017-09-07 NOTE — Discharge Instructions (Signed)

## 2017-09-13 ENCOUNTER — Encounter
Admission: RE | Disposition: A | Discharge status: Home / Self Care | Payer: Self-pay | Source: Ambulatory Visit | Attending: Ophthalmology

## 2017-09-13 ENCOUNTER — Ambulatory Visit
Admission: RE | Admit: 2017-09-13 | Discharge: 2017-09-13 | Disposition: A | Discharge status: Home / Self Care | Payer: Medicare Other | Source: Ambulatory Visit | Attending: Ophthalmology | Admitting: Ophthalmology

## 2017-09-13 ENCOUNTER — Ambulatory Visit: Payer: Medicare Other | Admitting: Anesthesiology

## 2017-09-13 DIAGNOSIS — N400 Enlarged prostate without lower urinary tract symptoms: Secondary | ICD-10-CM | POA: Diagnosis not present

## 2017-09-13 DIAGNOSIS — H2512 Age-related nuclear cataract, left eye: Secondary | ICD-10-CM | POA: Insufficient documentation

## 2017-09-13 DIAGNOSIS — I10 Essential (primary) hypertension: Secondary | ICD-10-CM | POA: Insufficient documentation

## 2017-09-13 DIAGNOSIS — G4730 Sleep apnea, unspecified: Secondary | ICD-10-CM | POA: Diagnosis not present

## 2017-09-13 DIAGNOSIS — G808 Other cerebral palsy: Secondary | ICD-10-CM | POA: Diagnosis not present

## 2017-09-13 DIAGNOSIS — M810 Age-related osteoporosis without current pathological fracture: Secondary | ICD-10-CM | POA: Diagnosis not present

## 2017-09-13 DIAGNOSIS — I059 Rheumatic mitral valve disease, unspecified: Secondary | ICD-10-CM | POA: Insufficient documentation

## 2017-09-13 DIAGNOSIS — Z85828 Personal history of other malignant neoplasm of skin: Secondary | ICD-10-CM | POA: Diagnosis not present

## 2017-09-13 HISTORY — PX: CATARACT EXTRACTION W/PHACO: SHX586

## 2017-09-13 SURGERY — PHACOEMULSIFICATION, CATARACT, WITH IOL INSERTION
Anesthesia: Monitor Anesthesia Care | Site: Eye | Laterality: Left | Wound class: "Clean "

## 2017-09-13 MED ORDER — OXYCODONE HCL 5 MG PO TABS: 5.0000 mg | ORAL_TABLET | Freq: Once | ORAL | Status: DC | PRN

## 2017-09-13 MED ORDER — BRIMONIDINE TARTRATE-TIMOLOL 0.2-0.5 % OP SOLN
OPHTHALMIC | Status: DC | PRN
  Administered 2017-09-13: 1 [drp] via OPHTHALMIC

## 2017-09-13 MED ORDER — LACTATED RINGERS IV SOLN: INTRAVENOUS | Status: DC

## 2017-09-13 MED ORDER — FENTANYL CITRATE (PF) 100 MCG/2ML IJ SOLN
INTRAMUSCULAR | Status: DC | PRN
  Administered 2017-09-13: 50 ug via INTRAVENOUS

## 2017-09-13 MED ORDER — MIDAZOLAM HCL 2 MG/2ML IJ SOLN
INTRAMUSCULAR | Status: DC | PRN
  Administered 2017-09-13: 2 mg via INTRAVENOUS

## 2017-09-13 MED ORDER — ARMC OPHTHALMIC DILATING DROPS
1.0000 "application " | OPHTHALMIC | Status: DC | PRN
  Administered 2017-09-13 (×3): 1 via OPHTHALMIC

## 2017-09-13 MED ORDER — EPINEPHRINE PF 1 MG/ML IJ SOLN
INTRAOCULAR | Status: DC | PRN
  Administered 2017-09-13: 75 mL via OPHTHALMIC

## 2017-09-13 MED ORDER — LIDOCAINE HCL (PF) 2 % IJ SOLN
INTRAOCULAR | Status: DC | PRN
  Administered 2017-09-13: 1 mL

## 2017-09-13 MED ORDER — MOXIFLOXACIN HCL 0.5 % OP SOLN
1.0000 [drp] | OPHTHALMIC | Status: DC | PRN
  Administered 2017-09-13 (×3): 1 [drp] via OPHTHALMIC

## 2017-09-13 MED ORDER — NA HYALUR & NA CHOND-NA HYALUR 0.4-0.35 ML IO KIT
PACK | INTRAOCULAR | Status: DC | PRN
  Administered 2017-09-13: 1 mL via INTRAOCULAR

## 2017-09-13 MED ORDER — OXYCODONE HCL 5 MG/5ML PO SOLN: 5.0000 mg | Freq: Once | ORAL | Status: DC | PRN

## 2017-09-13 MED ORDER — CEFUROXIME OPHTHALMIC INJECTION 1 MG/0.1 ML
INJECTION | OPHTHALMIC | Status: DC | PRN
  Administered 2017-09-13: 0.1 mL via INTRACAMERAL

## 2017-09-13 SURGICAL SUPPLY — 20 items
CANNULA ANT/CHMB 27G (MISCELLANEOUS) ×1 IMPLANT
CANNULA ANT/CHMB 27GA (MISCELLANEOUS) ×3 IMPLANT
GLOVE SURG LX 7.5 STRW (GLOVE) ×2
GLOVE SURG LX STRL 7.5 STRW (GLOVE) ×1 IMPLANT
GLOVE SURG TRIUMPH 8.0 PF LTX (GLOVE) ×3 IMPLANT
GOWN STRL REUS W/ TWL LRG LVL3 (GOWN DISPOSABLE) ×2 IMPLANT
GOWN STRL REUS W/TWL LRG LVL3 (GOWN DISPOSABLE) ×4
LENS IOL TECNIS ITEC 19.5 (Intraocular Lens) ×2 IMPLANT
MARKER SKIN DUAL TIP RULER LAB (MISCELLANEOUS) ×3 IMPLANT
NDL FILTER BLUNT 18X1 1/2 (NEEDLE) ×1 IMPLANT
NEEDLE FILTER BLUNT 18X 1/2SAF (NEEDLE) ×2
NEEDLE FILTER BLUNT 18X1 1/2 (NEEDLE) ×1 IMPLANT
PACK CATARACT BRASINGTON (MISCELLANEOUS) ×3 IMPLANT
PACK EYE AFTER SURG (MISCELLANEOUS) ×3 IMPLANT
PACK OPTHALMIC (MISCELLANEOUS) ×3 IMPLANT
SYR 3ML LL SCALE MARK (SYRINGE) ×3 IMPLANT
SYR 5ML LL (SYRINGE) ×3 IMPLANT
SYR TB 1ML LUER SLIP (SYRINGE) ×3 IMPLANT
WATER STERILE IRR 500ML POUR (IV SOLUTION) ×3 IMPLANT
WIPE NON LINTING 3.25X3.25 (MISCELLANEOUS) ×3 IMPLANT

## 2017-09-13 NOTE — Op Note (Signed)
OPERATIVE NOTE  Rocco Kerkhoff [id number] 09/13/2017   PREOPERATIVE DIAGNOSIS:  Nuclear sclerotic cataract left eye. H25.12   POSTOPERATIVE DIAGNOSIS:    Nuclear sclerotic cataract left eye.     PROCEDURE:  Phacoemusification with posterior chamber intraocular lens placement of the left eye   LENS:   Implant Name Type Inv. Item Serial No. Manufacturer Lot No. LRB No. Used  LENS IOL DIOP 19.5 - [id number] Intraocular Lens LENS IOL DIOP 19.5 [id number]  Left 1        ULTRASOUND TIME: 12  % of 1 minutes 19 seconds, CDE 9.4  SURGEON:  Wyonia Hough, MD   ANESTHESIA:  Topical with tetracaine drops and 2% Xylocaine jelly, augmented with 1% preservative-free intracameral lidocaine.    COMPLICATIONS:  None.   DESCRIPTION OF PROCEDURE:  The patient was identified in the holding room and transported to the operating room and placed in the supine position under the operating microscope.  The left eye was identified as the operative eye and it was prepped and draped in the usual sterile ophthalmic fashion.   A 1 millimeter clear-corneal paracentesis was made at the 1:30 position.  0.5 ml of preservative-free 1% lidocaine was injected into the anterior chamber.  The anterior chamber was filled with Viscoat viscoelastic.  A 2.4 millimeter keratome was used to make a near-clear corneal incision at the 10:30 position.  .  A curvilinear capsulorrhexis was made with a cystotome and capsulorrhexis forceps.  Balanced salt solution was used to hydrodissect and hydrodelineate the nucleus.   Phacoemulsification was then used in stop and chop fashion to remove the lens nucleus and epinucleus.  The remaining cortex was then removed using the irrigation and aspiration handpiece. Provisc was then placed into the capsular bag to distend it for lens placement.  A lens was then injected into the capsular bag.  The remaining viscoelastic was aspirated.   Wounds were hydrated with balanced salt  solution.  The anterior chamber was inflated to a physiologic pressure with balanced salt solution.  No wound leaks were noted. Cefuroxime 0.1 ml of a 10mg /ml solution was injected into the anterior chamber for a dose of 1 mg of intracameral antibiotic at the completion of the case.   Timolol and Brimonidine drops were applied to the eye.  The patient was taken to the recovery room in stable condition without complications of anesthesia or surgery.  Khadijah Mastrianni 09/13/2017, 12:02 PM

## 2017-09-13 NOTE — Anesthesia Postprocedure Evaluation (Signed)
Anesthesia Post Note  Patient: Dylan Sullivan  Procedure(s) Performed: CATARACT EXTRACTION PHACO AND INTRAOCULAR LENS PLACEMENT ([place]) LEFT (Left Eye)  Patient location during evaluation: PACU Anesthesia Type: MAC Level of consciousness: awake and alert Pain management: pain level controlled Vital Signs Assessment: post-procedure vital signs reviewed and stable Respiratory status: spontaneous breathing, nonlabored ventilation, respiratory function stable and patient connected to nasal cannula oxygen Cardiovascular status: stable and blood pressure returned to baseline Postop Assessment: no apparent nausea or vomiting Anesthetic complications: no    Naveya Ellerman

## 2017-09-13 NOTE — Anesthesia Preprocedure Evaluation (Addendum)
Anesthesia Evaluation  Patient identified by MRN, date of birth, ID band  Reviewed: NPO status   History of Anesthesia Complications Negative for: history of anesthetic complications  Airway Mallampati: II  TM Distance: >3 FB Neck ROM: full    Dental no notable dental hx.    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 Sabra Heck; neuro stable: 06/2017: dr. Lisabeth Devoid;    Reproductive/Obstetrics                            Anesthesia Physical Anesthesia Plan  ASA: III  Anesthesia Plan: MAC   Post-op Pain Management:    Induction:   PONV Risk Score and Plan:   Airway Management Planned:   Additional Equipment:   Intra-op Plan:   Post-operative Plan:   Informed Consent: I have reviewed the patients History and Physical, chart, labs and discussed the procedure including the risks, benefits and alternatives for the proposed anesthesia with the patient or authorized representative who has indicated his/her understanding and acceptance.     Plan Discussed with: CRNA  Anesthesia Plan Comments:        Anesthesia Quick Evaluation

## 2017-09-13 NOTE — Transfer of Care (Signed)
Immediate Anesthesia Transfer of Care Note  Patient: Dylan Sullivan  Procedure(s) Performed: CATARACT EXTRACTION PHACO AND INTRAOCULAR LENS PLACEMENT ([facility]) LEFT (Left Eye)  Patient Location: PACU  Anesthesia Type: MAC  Level of Consciousness: awake, alert  and patient cooperative  Airway and Oxygen Therapy: Patient Spontanous Breathing and Patient connected to supplemental oxygen  Post-op Assessment: Post-op Vital signs reviewed, Patient's Cardiovascular Status Stable, Respiratory Function Stable, Patent Airway and No signs of Nausea or vomiting  Post-op Vital Signs: Reviewed and stable  Complications: No apparent anesthesia complications

## 2017-09-13 NOTE — H&P (Signed)
The History and Physical notes are on paper, have been signed, and are to be scanned. The patient remains stable and unchanged from the H&P.   Previous H&P reviewed, patient examined, and there are no changes.  Lily Kernen 09/13/2017 11:16 AM

## 2017-09-13 NOTE — Anesthesia Procedure Notes (Signed)
Procedure Name: MAC Performed by: Niylah Hassan, CRNA Pre-anesthesia Checklist: Patient identified, Emergency Drugs available, Suction available, Timeout performed and Patient being monitored Patient Re-evaluated:Patient Re-evaluated prior to induction Oxygen Delivery Method: Nasal cannula Placement Confirmation: positive ETCO2       

## 2017-09-14 ENCOUNTER — Encounter: Payer: Self-pay | Admitting: Ophthalmology

## 2017-09-25 ENCOUNTER — Ambulatory Visit: Payer: Medicare Other | Admitting: Gastroenterology

## 2017-09-26 ENCOUNTER — Ambulatory Visit: Payer: Medicare Other | Admitting: Gastroenterology

## 2017-10-12 ENCOUNTER — Encounter: Payer: Self-pay | Admitting: Surgery

## 2017-10-12 ENCOUNTER — Ambulatory Visit (INDEPENDENT_AMBULATORY_CARE_PROVIDER_SITE_OTHER): Payer: Medicare Other | Admitting: Surgery

## 2017-10-12 VITALS — BP 151/75 | HR 103 | Temp 97.6°F | Ht 69.0 in | Wt 163.0 lb

## 2017-10-12 DIAGNOSIS — C44629 Squamous cell carcinoma of skin of left upper limb, including shoulder: Secondary | ICD-10-CM | POA: Diagnosis not present

## 2017-10-12 NOTE — Progress Notes (Signed)
Patient ID: Dylan Sullivan, male   DOB: 1946-06-13, 72 y.o.   MRN: [id number]  HPI Dylan Sullivan is a 72 y.o. male seen in consultation at the request of Dr. Rosario Jacks, [place] [facility].  For a left elbow lesion.  He reports that he has had this lesion for the last 2 or 3 months.  Slowly increasing in size.  There is evidence of easy bleeding and bruising.  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  He has a significant history of cerebral palsy and seizure disorder. Reports positive sun exposure.  No evidence of radiation therapy.  No weight loss no B symptoms. He is able to perform more than 4 METS of activity without any shortness of breath or chest pain. He is on a daily aspirin  HPI  Past Medical History:  Diagnosis Date  . BP (high blood pressure) 04/16/2015  . BPH (benign prostatic hyperplasia)   . BPH with obstruction/lower urinary tract symptoms 04/20/2015  . Carotid artery stenosis 11/11/2016  . Cerebral palsy ([place])   . Elevated PSA   . Hematuria   . Hemiplegia ([place]) 09/13/2011   Overview:  Upper extremity most affected   . History of elevated PSA 04/20/2015  . Hydronephrosis   . Kidney stone   . Nocturia   . Obstructive apnea 12/29/2014  . Osteoporosis   . 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  none since starting keppra  . 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 Seizures ([place])    1956-62. esolved after brain surgery  . Sleep apnea    CPAP  . Urinary retention     Past Surgical History:  Procedure Laterality Date  . APPENDECTOMY    . arm surgery Right    child  . [place]  . CATARACT EXTRACTION W/PHACO Left 09/13/2017   Procedure: CATARACT EXTRACTION PHACO AND INTRAOCULAR LENS PLACEMENT ([place]) LEFT;  Surgeon: Leandrew Koyanagi, MD;  Location: [facility];  Service: Ophthalmology;  Laterality: Left;  sleep apnea  . LEG SURGERY Right     child  . TONSILLECTOMY      Family History  Problem Relation Age of Onset  . Breast cancer Maternal Aunt   . Dementia Mother   . CAD Father   . Prostate cancer Neg Hx   . Bladder Cancer Neg Hx   . Kidney cancer Neg Hx     Social History Social History   Tobacco Use  . Smoking status: Never Smoker  . Smokeless tobacco: Never Used  Substance Use Topics  . Alcohol use: No    Alcohol/week: 0.0 oz  . Drug use: No    Allergies  Allergen Reactions  . Oxycodone-Acetaminophen Other (See Comments)    Causes hyperness    Current Outpatient Medications  Medication Sig Dispense Refill  . acidophilus (RISAQUAD) CAPS capsule Take 1 capsule by mouth daily.    Marland Kitchen aspirin 120 MG suppository Take by mouth.    Marland Kitchen aspirin EC 81 MG tablet Take 81 mg by mouth daily.    . calcium carbonate (TUMS EX) 750 MG chewable tablet Chew 1 tablet by mouth daily.     Marland Kitchen CAPSICUM, CAYENNE, PO Take 1 tablet by mouth daily.     . cholecalciferol (VITAMIN D) 1000 units tablet Take 5,000 Units by mouth daily.    . Cranberry 1000 MG CAPS Take by mouth.    Marland Kitchen  CRANBERRY PO Take 1 capsule by mouth daily.    . finasteride (PROSCAR) 5 MG tablet Take 1 tablet (5 mg total) by mouth daily. 30 tablet 12  . Flaxseed Oil (LINSEED OIL) OIL 15 mL.    . Flaxseed, Linseed, (FLAX SEED OIL PO) Take 1 capsule by mouth daily.    Marland Kitchen levETIRAcetam (KEPPRA) 750 MG tablet Take 1 tablet (750 mg total) by mouth 2 (two) times daily. 60 tablet 0  . meclizine (ANTIVERT) 12.5 MG tablet Take 1 tablet (12.5 mg total) by mouth 3 (three) times daily. 30 tablet 0  . midodrine (PROAMATINE) 10 MG tablet Take 1 tablet (10 mg total) by mouth 3 (three) times daily with meals. 90 tablet 0  . OVER THE COUNTER MEDICATION Take by mouth daily. chanca piedra    . phenytoin (DILANTIN) 100 MG ER capsule Take 300 mg by mouth daily.    . tamsulosin (FLOMAX) 0.4 MG CAPS capsule TAKE ONE CAPSULE BY MOUTH EVERY DAY 30 capsule 11  . Turmeric 500 MG CAPS Take 1  capsule by mouth daily.    . vitamin C (ASCORBIC ACID) 500 MG tablet Take 1,000 mg by mouth daily.     No current facility-administered medications for this visit.      Review of Systems Full ROS  was asked and was negative except for the information on the HPI  Physical Exam Blood pressure (!) 151/75, pulse (!) 103, temperature 97.6 F (36.4 C), temperature source Oral, height 5\' 9"  (1.753 m), weight 73.9 kg (163 lb). CONSTITUTIONAL: NAD. EYES: Pupils are equal, round, and reactive to light, Sclera are non-icteric. EARS, NOSE, MOUTH AND THROAT: The oropharynx is clear. The oral mucosa is pink and moist. Hearing is intact to voice. LYMPH NODES:  Lymph nodes in the neck are normal. RESPIRATORY:  Lungs are clear. There is normal respiratory effort, with equal breath sounds bilaterally, and without pathologic use of accessory muscles. CARDIOVASCULAR: Heart is regular without murmurs, gallops, or rubs. GI: The abdomen is  soft, nontender, and nondistended. There are no palpable masses. There is no hepatosplenomegaly. There are normal bowel sounds in all quadrants. GU: Rectal deferred.   MUSCULOSKELETAL: Normal muscle strength and tone. No cyanosis or edema.   SKIN: 2-1/2 cm raised pearly nodule within the skin of the left elbow.  There is no evidence of satellite lesions.  Both the neck and the axilla show no evidence of metastatic lymphadenopathy  NEUROLOGIC: Motor and sensation is grossly normal. Cranial nerves are grossly intact. PSYCH:  Oriented to person, place and time. Affect is normal.  Data Reviewed  I have personally reviewed the patient's imaging, laboratory findings and medical records.    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  Discussed with the patient in detail about options of incisional biopsy versus excisional biopsy both in the office or in the OR.  He understands if we do it here we can be able to do frozen sections and if in fact  the margins are positive he may need re-interventions. He wishes to have this done under local anesthesia here in our office.  We will set it out for 2 weeks and will have him stop the aspirin 5 days before.  Procedure discussed with the patient detail.  Risk benefit and possible complications including but not limited to: Bleeding, infection, recurrence, need for further interventions were discussed with the patient in detail.  He understands and wishes to proceed    Caroleen Hamman, MD FACS General  Surgeon 10/12/2017, 2:16 PM

## 2017-10-12 NOTE — Patient Instructions (Signed)
We will remove the Large skin lesion on your right elbow on 11/02/17 @ 10:45 am.  You will need to hold your Aspirin beginning 10/27/17.

## 2017-10-16 ENCOUNTER — Other Ambulatory Visit: Payer: Medicare Other

## 2017-10-16 ENCOUNTER — Other Ambulatory Visit: Payer: Self-pay

## 2017-10-16 DIAGNOSIS — R972 Elevated prostate specific antigen [PSA]: Secondary | ICD-10-CM

## 2017-10-17 ENCOUNTER — Ambulatory Visit (INDEPENDENT_AMBULATORY_CARE_PROVIDER_SITE_OTHER): Payer: Medicare Other | Admitting: Gastroenterology

## 2017-10-17 ENCOUNTER — Encounter: Payer: Self-pay | Admitting: Gastroenterology

## 2017-10-17 ENCOUNTER — Other Ambulatory Visit: Payer: Self-pay

## 2017-10-17 VITALS — BP 114/66 | HR 80 | Temp 97.6°F | Ht 69.0 in | Wt 166.6 lb

## 2017-10-17 DIAGNOSIS — D509 Iron deficiency anemia, unspecified: Secondary | ICD-10-CM | POA: Diagnosis not present

## 2017-10-17 LAB — PSA: Prostate Specific Ag, Serum: 2.7 ng/mL (ref 0.0–4.0)

## 2017-10-18 NOTE — Progress Notes (Signed)
Dylan Antigua, MD [address]  [place]  [place], [place] [address]  Main: [phone]  Fax: [phone]   Primary Care Physician: Casilda Carls, MD  Primary Gastroenterologist:  Dr. Vonda Sullivan  Chief Complaint  Patient presents with  . Follow-up    anemia    HPI: Abram Sax is a 73 y.o. male here for follow-up of anemia.  Patient was initially seen in consultation in January 2019, as his primary care found his hemoglobin to be 12.8, with MCV of 100.  Patient denies any blood in stool, abdominal pain, diarrhea or constipation, altered bowel habits, family history of colon cancer, nausea or vomiting, dysphagia or heartburn.  We had obtained anemia work-up which showed a normal ferritin at 56 in Jan 2019.  Iron level mildly low at 40.  Total iron binding capacity low at 231.  Saturation ratio is low at 17.   Last colonoscopy was in November 2015 by Dr. Allen Norris.  3 mm polyp removed from the ascending colon.  Found to be lymphoid aggregate with germinal center formation.  No previous EGDs.  Current Outpatient Medications  Medication Sig Dispense Refill  . aspirin EC 81 MG tablet Take 81 mg by mouth daily.    Marland Kitchen CAPSICUM, CAYENNE, PO Take 1 tablet by mouth daily.     . cholecalciferol (VITAMIN D) 1000 units tablet Take 5,000 Units by mouth daily.    . Cranberry 1000 MG CAPS Take by mouth.    . finasteride (PROSCAR) 5 MG tablet Take 1 tablet (5 mg total) by mouth daily. 30 tablet 12  . Flaxseed Oil (LINSEED OIL) OIL 15 mL.    . Flaxseed, Linseed, (FLAX SEED OIL PO) Take 1 capsule by mouth daily.    Marland Kitchen levETIRAcetam (KEPPRA) 750 MG tablet Take 1 tablet (750 mg total) by mouth 2 (two) times daily. 60 tablet 0  . midodrine (PROAMATINE) 10 MG tablet Take 1 tablet (10 mg total) by mouth 3 (three) times daily with meals. 90 tablet 0  . OVER THE COUNTER MEDICATION Take by mouth daily. chanca piedra    . phenytoin (DILANTIN) 100 MG ER capsule Take 300 mg by mouth daily.     . tamsulosin (FLOMAX) 0.4 MG CAPS capsule TAKE ONE CAPSULE BY MOUTH EVERY DAY 30 capsule 11  . Turmeric 500 MG CAPS Take 1 capsule by mouth daily.    . vitamin C (ASCORBIC ACID) 500 MG tablet Take 1,000 mg by mouth daily.    Marland Kitchen acidophilus (RISAQUAD) CAPS capsule Take 1 capsule by mouth daily.    Marland Kitchen aspirin 120 MG suppository Take by mouth.    . calcium carbonate (TUMS EX) 750 MG chewable tablet Chew 1 tablet by mouth daily.     Marland Kitchen CRANBERRY PO Take 1 capsule by mouth daily.    . meclizine (ANTIVERT) 12.5 MG tablet Take 1 tablet (12.5 mg total) by mouth 3 (three) times daily. (Patient not taking: Reported on 10/17/2017) 30 tablet 0   No current facility-administered medications for this visit.     Allergies as of 10/17/2017 - Review Complete 10/17/2017  Allergen Reaction Noted  . Oxycodone-acetaminophen Other (See Comments) 01/16/2017    ROS:  General: Negative for anorexia, weight loss, fever, chills, fatigue, weakness. ENT: Negative for hoarseness, difficulty swallowing , nasal congestion. CV: Negative for chest pain, angina, palpitations, dyspnea on exertion, peripheral edema.  Respiratory: Negative for dyspnea at rest, dyspnea on exertion, cough, sputum, wheezing.  GI: See history of present illness. GU:  Negative  for dysuria, hematuria, urinary incontinence, urinary frequency, nocturnal urination.  Endo: Negative for unusual weight change.    Physical Examination:   BP 114/66   Pulse 80   Temp 97.6 F (36.4 C) (Oral)   Ht 5\' 9"  (1.753 m)   Wt 166 lb 9.6 oz (75.6 kg)   BMI 24.60 kg/m   General: Well-nourished, well-developed in no acute distress.  Eyes: No icterus. Conjunctivae pink. Mouth: Oropharyngeal mucosa moist and pink , no lesions erythema or exudate. Neck: Supple, Trachea midline Abdomen: Bowel sounds are normal, nontender, nondistended, no hepatosplenomegaly or masses, no abdominal bruits or hernia , no rebound or guarding.   Extremities: No lower extremity  edema. No clubbing or deformities. Neuro: Alert and oriented x 3.  Grossly intact. Skin: Warm and dry, no jaundice.   Psych: Alert and cooperative, normal mood and affect.   Labs: CMP     Component Value Date/Time   NA 141 08/04/2017 0447   NA 136 09/09/2012 1330   K 3.8 08/04/2017 0447   K 3.6 09/09/2012 1330   CL 111 08/04/2017 0447   CL 99 09/09/2012 1330   CO2 23 08/04/2017 0447   CO2 30 09/09/2012 1330   GLUCOSE 100 (H) 08/04/2017 0447   GLUCOSE 124 (H) 09/09/2012 1330   BUN 15 08/04/2017 0447   BUN 19 (H) 09/09/2012 1330   CREATININE 0.91 08/04/2017 0447   CREATININE 1.15 09/09/2012 1330   CALCIUM 9.2 08/04/2017 0447   CALCIUM 10.2 (H) 09/09/2012 1330   PROT 7.7 08/03/2017 1135   ALBUMIN 4.0 08/03/2017 1135   AST 16 08/03/2017 1135   ALT 13 (L) 08/03/2017 1135   ALKPHOS 92 08/03/2017 1135   BILITOT 0.5 08/03/2017 1135   GFRNONAA >60 08/04/2017 0447   GFRAA >60 08/04/2017 0447   Lab Results  Component Value Date   WBC 6.1 08/04/2017   HGB 13.1 08/04/2017   HCT 37.4 (L) 08/04/2017   MCV 98.8 08/04/2017   PLT 348 08/04/2017    Imaging Studies: No results found.  Assessment and Plan:   Javonne Dorko is a 72 y.o. y/o male here for follow-up of anemia   Normocytic, with normal ferritin However, ferritin is low normal, and iron is low. His hemoglobin is normal at 13.1 However, due to the low normal ferritin, and low iron, we discussed colonoscopy and EGD to rule out any GI causes of iron deficiency. Patient would like to think about it before scheduling any procedures He will call us back to let us know in this regard No blood in stool, no abdominal pain, no weight loss, no dysphagia, no heartburn, no alarm symptoms present at this time to indicate urgent endoscopy If alarm symptoms occur, patient was asked to call us and he verbalized understanding   Dr Dylan Sullivan

## 2017-10-19 ENCOUNTER — Other Ambulatory Visit: Payer: Self-pay | Admitting: Urology

## 2017-10-19 ENCOUNTER — Ambulatory Visit: Payer: Medicare Other | Admitting: Urology

## 2017-10-19 DIAGNOSIS — N401 Enlarged prostate with lower urinary tract symptoms: Secondary | ICD-10-CM

## 2017-10-24 ENCOUNTER — Ambulatory Visit: Payer: Medicare Other | Admitting: Gastroenterology

## 2017-11-02 ENCOUNTER — Ambulatory Visit (INDEPENDENT_AMBULATORY_CARE_PROVIDER_SITE_OTHER): Payer: Medicare Other | Admitting: Surgery

## 2017-11-02 ENCOUNTER — Telehealth: Payer: Self-pay

## 2017-11-02 ENCOUNTER — Other Ambulatory Visit: Payer: Self-pay | Admitting: Surgery

## 2017-11-02 ENCOUNTER — Encounter: Payer: Self-pay | Admitting: Surgery

## 2017-11-02 VITALS — BP 146/79 | HR 103 | Temp 98.2°F | Ht 69.0 in | Wt 162.4 lb

## 2017-11-02 DIAGNOSIS — R2232 Localized swelling, mass and lump, left upper limb: Secondary | ICD-10-CM

## 2017-11-02 DIAGNOSIS — C44629 Squamous cell carcinoma of skin of left upper limb, including shoulder: Secondary | ICD-10-CM

## 2017-11-02 NOTE — Patient Instructions (Signed)
Please do not shower until Saturday.  Please do not submerge in water.   You may take Tylenol or Ibuprofen for the pain or discomfort.    Please see your follow up appointment listed below. We will discuss results of the pathology report at this visit.

## 2017-11-02 NOTE — Progress Notes (Signed)
PROCEDURE: 1.  Excision of 35 mm soft tissue nodule left elbow ( larger today, rapidly growing) 2.  Complex closure in 3 layers of a 6 cm defect left elbow  Anesthesia: 1% with epinephrine  EBL: 5 cc  Complications : none  After informed consent was obtained patient was prepped and draped in the usual sterile fashion elliptical incision was created with a 15 blade knife and the skin subtenons tissue was excised all the way to the fascia to have a good deep margin of resection.  The specimen was oriented and was sent for permanent pathology.  Hemostasis was achieved with pressure.  We were able to perform random cutaneous flaps to have an adequate apposition with a tension-free closure.  We closed the wound and a 3 layer form with a subcutaneous layer of 3 oh dermal layer of 3-0 interrupted and 4-0 Monocryl for the skin in a subcuticular fashion.  Dermabond was placed.  No complications

## 2017-11-02 NOTE — Telephone Encounter (Signed)
Spoke with [facility] @ [place] for specimen pickup. Packaged and placed in drop box for pickup.

## 2017-11-08 ENCOUNTER — Telehealth: Payer: Self-pay

## 2017-11-08 LAB — PATHOLOGY

## 2017-11-08 NOTE — Telephone Encounter (Signed)
Eft message to return call regarding pathology results. Be sure to follow up with Dr.Pabon 11/16/17.

## 2017-11-08 NOTE — Telephone Encounter (Signed)
Patient notified of pathology results and to keep follow up appointment 11/16/17 with Dr.Pabon.

## 2017-11-14 NOTE — Progress Notes (Signed)
3:30 PM   Dylan Sullivan May 25, 1946 [id number]  Referring provider: Casilda Carls, MD No address on file  Chief Complaint  Patient presents with  . Benign Prostatic Hypertrophy    HPI: Patient is a 72 year old Caucasian male with BPH and LUTS and a history of elevated PSA who presents today for 12 month follow-up.   BPH WITH LUTS His IPSS score today is 4, which is mild lower urinary tract symptomatology. He is mostly satisfied with his quality life due to his urinary symptoms.  His previous I PSS score was 6/1.  He has not urinary complaints at this time.  He denies any dysuria, hematuria or suprapubic pain.  He currently taking tamsulosin 0.4 mg daily and finasteride 5 mg daily.  He also denies any recent fevers, chills, nausea or vomiting.   He does not have a family history of PCa. IPSS    Row Name 11/15/17 1400         International Prostate Symptom Score   How often have you had the sensation of not emptying your bladder?  Not at All     How often have you had to urinate less than every two hours?  Less than 1 in 5 times     How often have you found you stopped and started again several times when you urinated?  Less than 1 in 5 times     How often have you found it difficult to postpone urination?  Not at All     How often have you had a weak urinary stream?  Not at All     How often have you had to strain to start urination?  Not at All     How many times did you typically get up at night to urinate?  2 Times     Total IPSS Score  4       Quality of Life due to urinary symptoms   If you were to spend the rest of your life with your urinary condition just the way it is now how would you feel about that?  Mostly Satisfied        Score:  1-7 Mild 8-19 Moderate 20-35 Severe   History of elevated PSA Patient's PSA reached 9.8 ng/mL in November 2015. This was in association with a urinary tract infection. When the urinary tract infection was treated in the PSA  repeated, it returned to baseline.  His most recent PSA was 2.7 ng/mL on 10/16/2017.    PMH: Past Medical History:  Diagnosis Date  . BP (high blood pressure) 04/16/2015  . BPH (benign prostatic hyperplasia)   . BPH with obstruction/lower urinary tract symptoms 04/20/2015  . Cancer ([place])    skin  . Carotid artery stenosis 11/11/2016  . Cerebral palsy ([place])   . Elevated PSA   . Hematuria   . Hemiplegia ([place]) 09/13/2011   Overview:  Upper extremity most affected   . History of elevated PSA 04/20/2015  . Hydronephrosis   . Kidney stone   . Nocturia   . Obstructive apnea 12/29/2014  . Osteoporosis   . 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  none since starting keppra  . 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 Seizures ([facility])    1956-62. esolved after brain surgery  . Sleep apnea    CPAP  . Urinary retention     Surgical History: Past Surgical History:  Procedure Laterality Date  . APPENDECTOMY    .  arm surgery Right    child  . [facility] [place]  . CATARACT EXTRACTION W/PHACO Left 09/13/2017   Procedure: CATARACT EXTRACTION PHACO AND INTRAOCULAR LENS PLACEMENT ([place]) LEFT;  Surgeon: Leandrew Koyanagi, MD;  Location: [facility];  Service: Ophthalmology;  Laterality: Left;  sleep apnea  . LEG SURGERY Right    child  . TONSILLECTOMY      Home Medications:  Allergies as of 11/15/2017      Reactions   Oxycodone-acetaminophen Other (See Comments)   Causes hyperness      Medication List        Accurate as of 11/15/17  3:30 PM. Always use your most recent med list.          acidophilus Caps capsule Take 1 capsule by mouth daily.   aspirin EC 81 MG tablet Take 81 mg by mouth daily.   calcium carbonate 750 MG chewable tablet Commonly known as:  TUMS EX Chew 1 tablet by mouth daily.   CAPSICUM (CAYENNE) PO Take 1 tablet by mouth daily.   cholecalciferol 1000 units tablet Commonly known as:  VITAMIN D Take 5,000 Units by  mouth daily.   Cranberry 1000 MG Caps Take by mouth.   Cranberry Powd Take by mouth.   finasteride 5 MG tablet Commonly known as:  PROSCAR TAKE ONE TABLET BY MOUTH EVERY DAY   levETIRAcetam 750 MG tablet Commonly known as:  KEPPRA Take 1 tablet (750 mg total) by mouth 2 (two) times daily.   Linseed Oil Oil 15 mL.   OVER THE COUNTER MEDICATION Take by mouth daily. chanca piedra   phenytoin 100 MG ER capsule Commonly known as:  DILANTIN Take 300 mg by mouth daily.   tamsulosin 0.4 MG Caps capsule Commonly known as:  FLOMAX TAKE ONE CAPSULE BY MOUTH EVERY DAY   Turmeric 500 MG Caps Take 1 capsule by mouth daily.   vitamin C 500 MG tablet Commonly known as:  ASCORBIC ACID Take 1,000 mg by mouth daily.       Allergies:  Allergies  Allergen Reactions  . Oxycodone-Acetaminophen Other (See Comments)    Causes hyperness    Family History: Family History  Problem Relation Age of Onset  . Breast cancer Maternal Aunt   . Dementia Mother   . CAD Father   . Prostate cancer Neg Hx   . Bladder Cancer Neg Hx   . Kidney cancer Neg Hx     Social History:  reports that he has never smoked. He has never used smokeless tobacco. He reports that he does not drink alcohol or use drugs.  ROS: UROLOGY Frequent Urination?: No Hard to postpone urination?: No Burning/pain with urination?: No Get up at night to urinate?: No Leakage of urine?: No Urine stream starts and stops?: No Trouble starting stream?: No Do you have to strain to urinate?: No Blood in urine?: No Urinary tract infection?: No Sexually transmitted disease?: No Injury to kidneys or bladder?: No Painful intercourse?: No Weak stream?: No Erection problems?: No Penile pain?: No  Gastrointestinal Nausea?: No Vomiting?: No Indigestion/heartburn?: No Diarrhea?: No Constipation?: No  Constitutional Fever: No Night sweats?: No Weight loss?: No Fatigue?: No  Skin Skin rash/lesions?: No Itching?:  No  Eyes Blurred vision?: No Double vision?: No  Ears/Nose/Throat Sore throat?: No Sinus problems?: No  Hematologic/Lymphatic Swollen glands?: No Easy bruising?: No  Cardiovascular Leg swelling?: No Chest pain?: No  Respiratory Cough?: No Shortness of breath?: No  Endocrine Excessive thirst?: No  Musculoskeletal  Back pain?: No Joint pain?: No  Neurological Headaches?: No Dizziness?: No  Psychologic Depression?: No Anxiety?: No  Physical Exam: BP 124/72 (BP Location: Left Arm, Patient Position: Sitting, Cuff Size: Normal)   Pulse 82   Ht 5\' 9"  (1.753 m)   Wt 167 lb 3.2 oz (75.8 kg)   BMI 24.69 kg/m   Constitutional: Well nourished. Alert and oriented, No acute distress. HEENT: [place] AT, moist mucus membranes. Trachea midline, no masses. Cardiovascular: No clubbing, cyanosis, or edema. Respiratory: Normal respiratory effort, no increased work of breathing. GI: Abdomen is soft, non tender, non distended, no abdominal masses. Liver and spleen not palpable.  No hernias appreciated.  Stool sample for occult testing is not indicated.   GU: No CVA tenderness.  No bladder fullness or masses.  Patient with circumcised phallus.  Urethral meatus is patent.  No penile discharge. No penile lesions or rashes. Scrotum without lesions, cysts, rashes and/or edema.  Testicles are located scrotally bilaterally. No masses are appreciated in the testicles. Left and right epididymis are normal. Rectal: Patient with  normal sphincter tone. Anus and perineum without scarring or rashes. No rectal masses are appreciated. Prostate is approximately 45 grams, no nodules are appreciated. Seminal vesicles are normal. Skin: No rashes, bruises or suspicious lesions. Lymph: No cervical or inguinal adenopathy. Neurologic: Grossly intact, no focal deficits, moving all 4 extremities. Psychiatric: Normal mood and affect.   Laboratory Data: Lab Results  Component Value Date   WBC 6.1 08/04/2017    HGB 13.1 08/04/2017   HCT 37.4 (L) 08/04/2017   MCV 98.8 08/04/2017   PLT 348 08/04/2017    Lab Results  Component Value Date   CREATININE 0.91 08/04/2017    PSA History:  3.8 ng/mL on 03/18/2013  2.8 ng/mL on 10/04/2013  3.9 ng/mL on 04/23/2014  2.8 ng/mL on 10/16/2014  2.9 ng/mL on 04/20/2015  2.4 ng/mL on 10/13/2015  3.2 ng/mL on 04/14/2016  2.6 ng/mL on 10/11/2016  2.7 ng/mL on 10/16/2017  I have reviewed the labs.    Assessment & Plan:    1. BPH with LUTS  - IPSS score is 4/3,  it is worsening  - Continue conservative management, avoiding bladder irritants and timed voiding's  - Continue tamsulosin 0.4 mg daily and finasteride 5 mg daily  - RTC in 12 months for IPSS, PSA and exam   2. History of elevated PSA:   Patient's PSA reached 9.8 ng/mL in November 2015. This was in association with a urinary tract infection. When the urinary tract infection was treated in the PSA repeated, it returned to baseline.  His most recent PSA was 2.6 ng/mL on 10/11/2016.  We will continue to monitor. Patient will return in 12 months for PSA and exam.  Return in about 1 year (around 11/16/2018) for IPSS, PSA and exam.  Zara Council, [facility]  [place] [address] [place], [place] [address] [phone]

## 2017-11-15 ENCOUNTER — Ambulatory Visit (INDEPENDENT_AMBULATORY_CARE_PROVIDER_SITE_OTHER): Payer: Medicare Other | Admitting: Surgery

## 2017-11-15 ENCOUNTER — Encounter: Payer: Self-pay | Admitting: *Deleted

## 2017-11-15 ENCOUNTER — Encounter: Payer: Self-pay | Admitting: Urology

## 2017-11-15 ENCOUNTER — Ambulatory Visit (INDEPENDENT_AMBULATORY_CARE_PROVIDER_SITE_OTHER): Payer: Medicare Other | Admitting: Urology

## 2017-11-15 ENCOUNTER — Encounter: Payer: Self-pay | Admitting: Surgery

## 2017-11-15 VITALS — BP 124/72 | HR 82 | Ht 69.0 in | Wt 167.2 lb

## 2017-11-15 VITALS — BP 124/71 | HR 77 | Temp 97.7°F | Ht 69.0 in | Wt 165.0 lb

## 2017-11-15 DIAGNOSIS — N401 Enlarged prostate with lower urinary tract symptoms: Secondary | ICD-10-CM

## 2017-11-15 DIAGNOSIS — Z09 Encounter for follow-up examination after completed treatment for conditions other than malignant neoplasm: Secondary | ICD-10-CM

## 2017-11-15 DIAGNOSIS — Z87898 Personal history of other specified conditions: Secondary | ICD-10-CM | POA: Diagnosis not present

## 2017-11-15 DIAGNOSIS — N138 Other obstructive and reflux uropathy: Secondary | ICD-10-CM

## 2017-11-15 NOTE — Patient Instructions (Signed)
Please call our office if you have questions or concerns.   

## 2017-11-15 NOTE — Progress Notes (Signed)
S/p SCC excision left elbow Margins are clear Path d/w pt He is doing very well w/o complaints  PE NAD Wound healing well, no infection. No seroma  A/p SCC D/W pt in detail about importance of avoiding sun and yearly PE RTC prn

## 2017-11-16 ENCOUNTER — Encounter: Payer: Self-pay | Admitting: Surgery

## 2017-11-21 ENCOUNTER — Encounter: Payer: Self-pay | Admitting: *Deleted

## 2017-11-28 ENCOUNTER — Encounter
Admission: RE | Disposition: A | Discharge status: Home / Self Care | Payer: Self-pay | Source: Ambulatory Visit | Attending: Ophthalmology

## 2017-11-28 ENCOUNTER — Ambulatory Visit
Admission: RE | Admit: 2017-11-28 | Discharge: 2017-11-28 | Disposition: A | Discharge status: Home / Self Care | Payer: Medicare Other | Source: Ambulatory Visit | Attending: Ophthalmology | Admitting: Ophthalmology

## 2017-11-28 ENCOUNTER — Ambulatory Visit: Payer: Medicare Other | Admitting: Anesthesiology

## 2017-11-28 ENCOUNTER — Encounter: Payer: Self-pay | Admitting: Emergency Medicine

## 2017-11-28 DIAGNOSIS — H2511 Age-related nuclear cataract, right eye: Secondary | ICD-10-CM | POA: Insufficient documentation

## 2017-11-28 DIAGNOSIS — I10 Essential (primary) hypertension: Secondary | ICD-10-CM | POA: Insufficient documentation

## 2017-11-28 DIAGNOSIS — M810 Age-related osteoporosis without current pathological fracture: Secondary | ICD-10-CM | POA: Insufficient documentation

## 2017-11-28 DIAGNOSIS — Z85828 Personal history of other malignant neoplasm of skin: Secondary | ICD-10-CM | POA: Insufficient documentation

## 2017-11-28 DIAGNOSIS — G4730 Sleep apnea, unspecified: Secondary | ICD-10-CM | POA: Diagnosis not present

## 2017-11-28 HISTORY — DX: Malignant (primary) neoplasm, unspecified: C80.1

## 2017-11-28 HISTORY — PX: CATARACT EXTRACTION W/PHACO: SHX586

## 2017-11-28 SURGERY — PHACOEMULSIFICATION, CATARACT, WITH IOL INSERTION
Anesthesia: Monitor Anesthesia Care | Site: Eye | Laterality: Right | Wound class: "Clean "

## 2017-11-28 MED ORDER — SODIUM CHLORIDE 0.9 % IV SOLN
INTRAVENOUS | Status: DC
  Administered 2017-11-28: 07:00:00 via INTRAVENOUS

## 2017-11-28 MED ORDER — FENTANYL CITRATE (PF) 100 MCG/2ML IJ SOLN
INTRAMUSCULAR | Status: AC
  Filled 2017-11-28: qty 2

## 2017-11-28 MED ORDER — NA HYALUR & NA CHOND-NA HYALUR 0.4-0.35 ML IO KIT
PACK | INTRAOCULAR | Status: DC | PRN
  Administered 2017-11-28: 1 mL via INTRAOCULAR

## 2017-11-28 MED ORDER — LIDOCAINE HCL (PF) 4 % IJ SOLN
INTRAMUSCULAR | Status: DC | PRN
  Administered 2017-11-28: 2 mL via OPHTHALMIC

## 2017-11-28 MED ORDER — CARBACHOL 0.01 % IO SOLN
INTRAOCULAR | Status: DC | PRN
  Administered 2017-11-28: .5 mL via INTRAOCULAR

## 2017-11-28 MED ORDER — NEOMYCIN-POLYMYXIN-DEXAMETH 0.1 % OP OINT
TOPICAL_OINTMENT | OPHTHALMIC | Status: DC | PRN
  Administered 2017-11-28: 1 via OPHTHALMIC

## 2017-11-28 MED ORDER — MIDAZOLAM HCL 2 MG/2ML IJ SOLN
INTRAMUSCULAR | Status: DC | PRN
  Administered 2017-11-28: 1 mg via INTRAVENOUS

## 2017-11-28 MED ORDER — POVIDONE-IODINE 5 % OP SOLN
OPHTHALMIC | Status: DC | PRN
  Administered 2017-11-28: 1 via OPHTHALMIC

## 2017-11-28 MED ORDER — ARMC OPHTHALMIC DILATING DROPS
OPHTHALMIC | Status: AC
  Administered 2017-11-28: 1 via OPHTHALMIC
  Filled 2017-11-28: qty 0.4

## 2017-11-28 MED ORDER — MOXIFLOXACIN HCL 0.5 % OP SOLN
OPHTHALMIC | Status: AC
  Filled 2017-11-28: qty 3

## 2017-11-28 MED ORDER — EPINEPHRINE PF 1 MG/ML IJ SOLN
INTRAOCULAR | Status: DC | PRN
  Administered 2017-11-28: 1 mL via OPHTHALMIC

## 2017-11-28 MED ORDER — MOXIFLOXACIN HCL 0.5 % OP SOLN: 1.0000 [drp] | OPHTHALMIC | Status: DC | PRN

## 2017-11-28 MED ORDER — MIDAZOLAM HCL 2 MG/2ML IJ SOLN
INTRAMUSCULAR | Status: AC
  Filled 2017-11-28: qty 2

## 2017-11-28 MED ORDER — MOXIFLOXACIN HCL 0.5 % OP SOLN
1.0000 [drp] | OPHTHALMIC | Status: AC
  Administered 2017-11-28 (×3): 1 [drp] via OPHTHALMIC

## 2017-11-28 MED ORDER — FENTANYL CITRATE (PF) 100 MCG/2ML IJ SOLN
INTRAMUSCULAR | Status: DC | PRN
  Administered 2017-11-28: 25 ug via INTRAVENOUS

## 2017-11-28 MED ORDER — ARMC OPHTHALMIC DILATING DROPS
1.0000 "application " | OPHTHALMIC | Status: AC
  Administered 2017-11-28 (×3): 1 via OPHTHALMIC

## 2017-11-28 SURGICAL SUPPLY — 18 items
GLOVE BIO SURGEON STRL SZ8 (GLOVE) ×3 IMPLANT
GLOVE BIOGEL M 6.5 STRL (GLOVE) ×3 IMPLANT
GLOVE SURG LX 7.5 STRW (GLOVE) ×2
GLOVE SURG LX STRL 7.5 STRW (GLOVE) ×1 IMPLANT
GOWN STRL REUS W/ TWL LRG LVL3 (GOWN DISPOSABLE) ×2 IMPLANT
GOWN STRL REUS W/TWL LRG LVL3 (GOWN DISPOSABLE) ×4
LABEL CATARACT MEDS ST (LABEL) ×3 IMPLANT
LENS IOL TECNIS ITEC 20.0 (Intraocular Lens) ×2 IMPLANT
NDL HPO THNWL 1X22GA REG BVL (NEEDLE) ×1 IMPLANT
NEEDLE SAFETY 22GX1 (NEEDLE) ×2
PACK CATARACT (MISCELLANEOUS) ×3 IMPLANT
PACK CATARACT BRASINGTON LX (MISCELLANEOUS) ×3 IMPLANT
PACK EYE AFTER SURG (MISCELLANEOUS) ×3 IMPLANT
SOL BSS BAG (MISCELLANEOUS) ×3
SOLUTION BSS BAG (MISCELLANEOUS) ×1 IMPLANT
SYR 5ML LL (SYRINGE) ×3 IMPLANT
WATER STERILE IRR 250ML POUR (IV SOLUTION) ×3 IMPLANT
WIPE NON LINTING 3.25X3.25 (MISCELLANEOUS) ×3 IMPLANT

## 2017-11-28 NOTE — Transfer of Care (Signed)
Immediate Anesthesia Transfer of Care Note  Patient: Dylan Sullivan  Procedure(s) Performed: CATARACT EXTRACTION PHACO AND INTRAOCULAR LENS PLACEMENT ([facility]) (Right Eye)  Patient Location: PACU  Anesthesia Type:MAC  Level of Consciousness: awake, alert  and oriented  Airway & Oxygen Therapy: Patient Spontanous Breathing  Post-op Assessment: Report given to RN and Post -op Vital signs reviewed and stable  Post vital signs: stable  Last Vitals:  Vitals  Value Taken Time  BP     Temp     Pulse     Resp     SpO2       Last Pain:  Vitals:   11/28/17 0829  TempSrc: Temporal  PainSc: 0-No pain         Complications: No apparent anesthesia complications

## 2017-11-28 NOTE — Op Note (Signed)
LOCATION:  [facility]   PREOPERATIVE DIAGNOSIS:    Nuclear sclerotic cataract right eye. H25.11   POSTOPERATIVE DIAGNOSIS:  Nuclear sclerotic cataract right eye.     PROCEDURE:  Phacoemusification with posterior chamber intraocular lens placement of the right eye   LENS:   Implant Name Type Inv. Item Serial No. Manufacturer Lot No. LRB No. Used  LENS IOL DIOP 20.0 - [id number] Intraocular Lens LENS IOL DIOP 20.0 [id number] AMO  Right 1        ULTRASOUND TIME: 9 % of 0 minutes, 55 seconds.  CDE 5.0   SURGEON:  Wyonia Hough, MD   ANESTHESIA:  Topical with tetracaine drops and 2% Xylocaine jelly, augmented with 1% preservative-free intracameral lidocaine.    COMPLICATIONS:  None.   DESCRIPTION OF PROCEDURE:  The patient was identified in the holding room and transported to the operating room and placed in the supine position under the operating microscope.  The right eye was identified as the operative eye and it was prepped and draped in the usual sterile ophthalmic fashion.   A 1 millimeter clear-corneal paracentesis was made at the 12:00 position.  0.5 ml of preservative-free 1% lidocaine was injected into the anterior chamber. The anterior chamber was filled with Viscoat viscoelastic.  A 2.4 millimeter keratome was used to make a near-clear corneal incision at the 9:00 position.  A curvilinear capsulorrhexis was made with a cystotome and capsulorrhexis forceps.  Balanced salt solution was used to hydrodissect and hydrodelineate the nucleus.   Phacoemulsification was then used in stop and chop fashion to remove the lens nucleus and epinucleus.  The remaining cortex was then removed using the irrigation and aspiration handpiece. Provisc was then placed into the capsular bag to distend it for lens placement.  A lens was then injected into the capsular bag.  The remaining viscoelastic was aspirated.   Wounds were hydrated with balanced salt solution.  The anterior  chamber was inflated to a physiologic pressure with balanced salt solution.  No wound leaks were noted. Vigamox 0.2 ml of a 1mg  per ml solution was injected into the anterior chamber for a dose of 0.2 mg of intracameral antibiotic at the completion of the case.   Timolol and Brimonidine drops were applied to the eye.  The patient was taken to the recovery room in stable condition without complications of anesthesia or surgery.   Dylan Sullivan 11/28/2017, 8:25 AM

## 2017-11-28 NOTE — H&P (Signed)
The History and Physical notes are on paper, have been signed, and are to be scanned. The patient remains stable and unchanged from the H&P.   Previous H&P reviewed, patient examined, and there are no changes.  Dylan Sullivan 11/28/2017 7:25 AM

## 2017-11-28 NOTE — Discharge Instructions (Signed)
Eye Surgery Discharge Instructions  Expect mild scratchy sensation or mild soreness. DO NOT RUB YOUR EYE!  The day of surgery:  Minimal physical activity, but bed rest is not required  No reading, computer work, or close hand work  No bending, lifting, or straining.  May watch TV  For 24 hours:  No driving, legal decisions, or alcoholic beverages  Safety precautions  Eat anything you prefer: It is better to start with liquids, then soup then solid foods.  _____ Eye patch should be worn until postoperative exam tomorrow.  ____ Solar shield eyeglasses should be worn for comfort in the sunlight/patch while sleeping  Resume all regular medications including aspirin or Coumadin if these were discontinued prior to surgery. You may shower, bathe, shave, or wash your hair. Tylenol may be taken for mild discomfort.  Call your doctor if you experience significant pain, nausea, or vomiting, fever > 101 or other signs of infection. [phone] or [phone] Specific instructions:  Follow-up Information    Leandrew Koyanagi, MD Follow up on 11/28/2017.   Specialty:  Ophthalmology Why:  appointment at 2:50 PM Contact information: [address]   [place] [address] [phone]

## 2017-11-28 NOTE — Anesthesia Preprocedure Evaluation (Signed)
Anesthesia Evaluation  Patient identified by MRN, date of birth, ID band Patient awake    Reviewed: Allergy & Precautions, NPO status , Patient's Chart, lab work & pertinent test results  History of Anesthesia Complications Negative for: history of anesthetic complications  Airway Mallampati: II  TM Distance: >3 FB Neck ROM: full    Dental no notable dental hx.    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 Sabra Heck; neuro stable: 06/2017: dr. Lisabeth Devoid;    Reproductive/Obstetrics                             Anesthesia Physical  Anesthesia Plan  ASA: III  Anesthesia Plan: MAC   Post-op Pain Management:    Induction:   PONV Risk Score and Plan:   Airway Management Planned: Nasal Cannula  Additional Equipment:   Intra-op Plan:   Post-operative Plan:   Informed Consent: I have reviewed the patients History and Physical, chart, labs and discussed the procedure including the risks, benefits and alternatives for the proposed anesthesia with the patient or authorized representative who has  indicated his/her understanding and acceptance.     Plan Discussed with: CRNA  Anesthesia Plan Comments:         Anesthesia Quick Evaluation

## 2017-11-28 NOTE — Anesthesia Post-op Follow-up Note (Signed)
Anesthesia QCDR form completed.        

## 2017-11-28 NOTE — Anesthesia Postprocedure Evaluation (Signed)
Anesthesia Post Note  Patient: Dylan Sullivan  Procedure(s) Performed: CATARACT EXTRACTION PHACO AND INTRAOCULAR LENS PLACEMENT ([place]) (Right Eye)  Patient location during evaluation: PACU Anesthesia Type: MAC Level of consciousness: awake and alert Pain management: pain level controlled Vital Signs Assessment: post-procedure vital signs reviewed and stable Respiratory status: spontaneous breathing Cardiovascular status: blood pressure returned to baseline Anesthetic complications: no     Last Vitals:  Vitals:   11/28/17 0800 11/28/17 0829  BP: (!) 162/71 126/61  Pulse: 82 69  Resp:  16  Temp:  36.6 C  SpO2:  96%    Last Pain:  Vitals:   11/28/17 0829  TempSrc: Temporal  PainSc: 0-No pain                 Tarence Searcy Marilynn Rail

## 2017-12-01 ENCOUNTER — Telehealth: Payer: Self-pay

## 2017-12-01 ENCOUNTER — Telehealth: Payer: Self-pay | Admitting: Gastroenterology

## 2017-12-01 NOTE — Telephone Encounter (Signed)
-----   Message from Virgel Manifold, MD sent at 11/28/2017  3:53 PM EDT ----- Jackelyn Poling, can you please call this patient.  I had called him after his clinic visit and discussed doing an EGD and colonoscopy for iron deficiency, and he said he would get back to [place].  I am recommending both procedures due to iron deficiency to rule out any lesions.  See if he is ready to get this scheduled.  If not, have him follow-up with me in clinic in 3 months so we can schedule it at that time.

## 2017-12-01 NOTE — Telephone Encounter (Signed)
I spoke with pt and he asked if he needed one and I said the Dr. Bonna Gains recommends this due to iron deficiency anemia and to look for lesions. He states he has an appt with Dr. Rosario Jacks next week and he will ask him. He states he is taking iron. I reminded pt that Dr. Rosario Jacks did refer him to Dr. Bonna Gains but he wants to wait. He also wants to wait and schedule a f/u visit with GI. Pt states he will call us and let us know. Pt seemed to get flustrated as by his sigh and somewhat change in tone.

## 2017-12-01 NOTE — Telephone Encounter (Signed)
PT IS RETURNING YOUR CALL.

## 2017-12-18 ENCOUNTER — Telehealth: Payer: Self-pay | Admitting: Urology

## 2017-12-18 NOTE — Telephone Encounter (Signed)
Pharmacy [facility] asking for a refill of pt's finesteride, former customer at [employer]. Please advise. Thank.s

## 2017-12-20 ENCOUNTER — Other Ambulatory Visit: Payer: Self-pay

## 2017-12-20 DIAGNOSIS — N401 Enlarged prostate with lower urinary tract symptoms: Secondary | ICD-10-CM

## 2017-12-20 DIAGNOSIS — N138 Other obstructive and reflux uropathy: Secondary | ICD-10-CM

## 2017-12-20 MED ORDER — TAMSULOSIN HCL 0.4 MG PO CAPS: 0.4000 mg | ORAL_CAPSULE | Freq: Every day | ORAL | 11 refills | Status: DC

## 2017-12-20 MED ORDER — FINASTERIDE 5 MG PO TABS: 5.0000 mg | ORAL_TABLET | Freq: Every day | ORAL | 12 refills | Status: DC

## 2018-04-27 ENCOUNTER — Encounter (INDEPENDENT_AMBULATORY_CARE_PROVIDER_SITE_OTHER): Payer: Self-pay | Admitting: Vascular Surgery

## 2018-04-27 ENCOUNTER — Ambulatory Visit (INDEPENDENT_AMBULATORY_CARE_PROVIDER_SITE_OTHER): Payer: Medicare Other | Admitting: Vascular Surgery

## 2018-04-27 ENCOUNTER — Ambulatory Visit (INDEPENDENT_AMBULATORY_CARE_PROVIDER_SITE_OTHER): Payer: Medicare Other

## 2018-04-27 VITALS — BP 139/78 | HR 80 | Resp 17 | Ht 67.5 in | Wt 176.0 lb

## 2018-04-27 DIAGNOSIS — I6523 Occlusion and stenosis of bilateral carotid arteries: Secondary | ICD-10-CM

## 2018-04-27 DIAGNOSIS — I10 Essential (primary) hypertension: Secondary | ICD-10-CM

## 2018-04-27 DIAGNOSIS — G8191 Hemiplegia, unspecified affecting right dominant side: Secondary | ICD-10-CM | POA: Diagnosis not present

## 2018-04-27 NOTE — Assessment & Plan Note (Signed)
His carotid duplex today reveals near normal carotid flow bilaterally with intimal thickening and no hemodynamically significant stenosis.  At this point, I would like to lengthen his follow-up to every other year.  He will take a baby aspirin daily.  Contact our office with problems in the interim

## 2018-04-27 NOTE — Progress Notes (Signed)
MRN : [id number]  Dylan Sullivan is a 72 y.o. ([birth year]) male who presents with chief complaint of  Chief Complaint  Patient presents with  . Follow-up    1 year Carotid f/u  .  History of Present Illness: Patient returns in follow-up of carotid disease.  He is doing well with no new complaints today.  He denies focal neurologic symptoms. Specifically, the patient denies amaurosis fugax, speech or swallowing difficulties, or arm or leg weakness or numbness His carotid duplex today reveals near normal carotid flow bilaterally with intimal thickening and no hemodynamically significant stenosis.  Current Outpatient Medications  Medication Sig Dispense Refill  . acidophilus (RISAQUAD) CAPS capsule Take 1 capsule by mouth 2 (two) times daily.     Marland Kitchen aspirin EC 81 MG tablet Take 81 mg by mouth daily.    Marland Kitchen CAPSICUM, CAYENNE, PO Take 25 mg by mouth daily.     Marland Kitchen CRANBERRY PO Take 1 capsule by mouth daily.     . finasteride (PROSCAR) 5 MG tablet Take 1 tablet (5 mg total) by mouth daily. 30 tablet 12  . Flaxseed Oil (LINSEED OIL) OIL Take 52ml by mouth daily    . levETIRAcetam (KEPPRA) 750 MG tablet Take 1 tablet (750 mg total) by mouth 2 (two) times daily. 60 tablet 0  . Misc Natural Products (ESTROVEN + ENERGY MAX STRENGTH) TABS Take by mouth.    Marland Kitchen OVER THE COUNTER MEDICATION Take 1 capsule by mouth daily. chanca piedra     . OVER THE COUNTER MEDICATION Take 29 mg by mouth daily. Black Strap Molasses    . phenytoin (DILANTIN) 100 MG ER capsule Take 300 mg by mouth at bedtime.     . tamsulosin (FLOMAX) 0.4 MG CAPS capsule Take 1 capsule (0.4 mg total) by mouth daily. 30 capsule 11  . Turmeric 500 MG CAPS Take 500 mg by mouth 2 (two) times daily.     . vitamin C (ASCORBIC ACID) 500 MG tablet Take 1,000 mg by mouth 2 (two) times daily.      No current facility-administered medications for this visit.     Past Medical History:  Diagnosis Date  . BP (high blood pressure) 04/16/2015  . BPH  (benign prostatic hyperplasia)   . BPH with obstruction/lower urinary tract symptoms 04/20/2015  . Cancer ([place])    skin  . Carotid artery stenosis 11/11/2016  . Cerebral palsy ([place])   . Elevated PSA   . Hematuria   . Hemiplegia ([place]) 09/13/2011   Overview:  Upper extremity most affected   . History of elevated PSA 04/20/2015  . Hydronephrosis   . Kidney stone   . Nocturia   . Obstructive apnea 12/29/2014  . Osteoporosis   . 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  none since starting keppra  . 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 Seizures ([place])    1956-62. esolved after brain surgery  . Sleep apnea    CPAP  . Urinary retention     Past Surgical History:  Procedure Laterality Date  . APPENDECTOMY    . arm surgery Right    child  . [place]  . CATARACT EXTRACTION W/PHACO Left 09/13/2017   Procedure: CATARACT EXTRACTION PHACO AND INTRAOCULAR LENS PLACEMENT ([place]) LEFT;  Surgeon: Leandrew Koyanagi, MD;  Location: [place];  Service: Ophthalmology;  Laterality: Left;  sleep apnea  . CATARACT EXTRACTION W/PHACO Right 11/28/2017   Procedure: CATARACT EXTRACTION PHACO AND INTRAOCULAR  LENS PLACEMENT ([facility]);  Surgeon: Leandrew Koyanagi, MD;  Location: [facility];  Service: Ophthalmology;  Laterality: Right;  [place] 00:55 AP% 9.2 CDE 5.04 Fluid pack lot # [id number]  . LEG SURGERY Right    child  . TONSILLECTOMY      Social History Social History   Tobacco Use  . Smoking status: Never Smoker  . Smokeless tobacco: Never Used  Substance Use Topics  . Alcohol use: No    Alcohol/week: 0.0 standard drinks  . Drug use: No    Family History Family History  Problem Relation Age of Onset  . Breast cancer Maternal Aunt   . Dementia Mother   . CAD Father   . Prostate cancer Neg Hx   . Bladder Cancer Neg Hx   . Kidney cancer Neg Hx     Allergies  Allergen Reactions  . Oxycodone-Acetaminophen Other (See Comments)    Causes  hyperness     REVIEW OF SYSTEMS(Negative unless checked)  Constitutional: [] Weight loss[] Fever[] Chills Cardiac:[] Chest pain[] Chest pressure[] Palpitations [] Shortness of breath when laying flat [] Shortness of breath at rest [] Shortness of breath with exertion. Vascular: [] Pain in legs with walking[] Pain in legsat rest[] Pain in legs when laying flat [] Claudication [] Pain in feet when walking [] Pain in feet at rest [] Pain in feet when laying flat [] History of DVT [] Phlebitis [] Swelling in legs [] Varicose veins [] Non-healing ulcers Pulmonary: [] Uses home oxygen [] Productive cough[] Hemoptysis [] Wheeze [] COPD [] Asthma Neurologic: [] Dizziness [] Blackouts [x] Seizures [] History of stroke [] History of TIA[] Aphasia [] Temporary blindness[] Dysphagia [x] Weaknessor numbness in arms [x] Weakness or numbnessin legs Musculoskeletal: [] Arthritis [] Joint swelling [] Joint pain [] Low back pain Hematologic:[] Easy bruising[] Easy bleeding [] Hypercoagulable state [] Anemic [] Hepatitis Gastrointestinal:[] Blood in stool[] Vomiting blood[] Gastroesophageal reflux/heartburn[] Abdominal pain Genitourinary: [] Chronic kidney disease [x] Difficulturination [x] Frequenturination [] Burning with urination[] Hematuria Skin: [] Rashes [] Ulcers [] Wounds Psychological: [] History of anxiety[] History of major depression.  Physical Examination  Vitals:   04/27/18 1330  BP: 139/78  Pulse: 80  Resp: 17  Weight: 176 lb (79.8 kg)  Height: 5' 7.5" (1.715 m)   Body mass index is 27.16 kg/m. Gen:  WD/WN, NAD Head: [place]/AT, No temporalis wasting. Ear/Nose/Throat: Hearing diminished, nares w/o erythema or drainage, trachea midline Eyes: Conjunctiva clear. Sclera non-icteric Neck: Supple.  No bruit  Pulmonary:  Good air movement, equal and clear to auscultation bilaterally.  Cardiac: RRR, No JVD Vascular:  Vessel Right  Left  Radial Palpable Palpable                                    Musculoskeletal: right arm is profoundly weak.  No LE edema Neurologic: CN 2-12 intact. Sensation grossly intact in extremities.  Symmetrical.  Speech is fluent. Motor exam as listed above. Psychiatric: Judgment intact, Mood & affect appropriate for pt's clinical situation. Dermatologic: No rashes or ulcers noted.  No cellulitis or open wounds.      CBC Lab Results  Component Value Date   WBC 6.1 08/04/2017   HGB 13.1 08/04/2017   HCT 37.4 (L) 08/04/2017   MCV 98.8 08/04/2017   PLT 348 08/04/2017    BMET    Component Value Date/Time   NA 141 08/04/2017 0447   NA 136 09/09/2012 1330   K 3.8 08/04/2017 0447   K 3.6 09/09/2012 1330   CL 111 08/04/2017 0447   CL 99 09/09/2012 1330   CO2 23 08/04/2017 0447   CO2 30 09/09/2012 1330   GLUCOSE 100 (H) 08/04/2017 0447   GLUCOSE 124 (H) 09/09/2012 1330   BUN 15 08/04/2017  0447   BUN 19 (H) 09/09/2012 1330   CREATININE 0.91 08/04/2017 0447   CREATININE 1.15 09/09/2012 1330   CALCIUM 9.2 08/04/2017 0447   CALCIUM 10.2 (H) 09/09/2012 1330   GFRNONAA >60 08/04/2017 0447   GFRAA >60 08/04/2017 0447   CrCl cannot be calculated (Patient's most recent lab result is older than the maximum 21 days allowed.).  COAG No results found for: INR, PROTIME  Radiology No results found.   Assessment/Plan BP (high blood pressure) blood pressure control important in reducing the progression of atherosclerotic disease. On appropriate oral medications.   Right hemiplegia (HCC) From CP, not a stroke  Carotid artery stenosis His carotid duplex today reveals near normal carotid flow bilaterally with intimal thickening and no hemodynamically significant stenosis.  At this point, I would like to lengthen his follow-up to every other year.  He will take a baby aspirin daily.  Contact our office with problems in the interim    Leotis Pain, MD  04/27/2018 2:27  PM    This note was created with Dragon medical transcription system.  Any errors from dictation are purely unintentional

## 2018-11-13 ENCOUNTER — Other Ambulatory Visit: Payer: Self-pay | Admitting: Family Medicine

## 2018-11-13 DIAGNOSIS — R972 Elevated prostate specific antigen [PSA]: Secondary | ICD-10-CM

## 2018-11-13 DIAGNOSIS — N138 Other obstructive and reflux uropathy: Secondary | ICD-10-CM

## 2018-11-13 DIAGNOSIS — N401 Enlarged prostate with lower urinary tract symptoms: Secondary | ICD-10-CM

## 2018-11-13 DIAGNOSIS — Z87898 Personal history of other specified conditions: Secondary | ICD-10-CM

## 2018-11-14 ENCOUNTER — Other Ambulatory Visit: Payer: Medicare Other

## 2018-11-14 ENCOUNTER — Other Ambulatory Visit: Payer: Self-pay

## 2018-11-14 DIAGNOSIS — Z87898 Personal history of other specified conditions: Secondary | ICD-10-CM

## 2018-11-14 DIAGNOSIS — N401 Enlarged prostate with lower urinary tract symptoms: Secondary | ICD-10-CM

## 2018-11-14 DIAGNOSIS — R972 Elevated prostate specific antigen [PSA]: Secondary | ICD-10-CM

## 2018-11-14 DIAGNOSIS — N138 Other obstructive and reflux uropathy: Secondary | ICD-10-CM

## 2018-11-15 LAB — PSA: Prostate Specific Ag, Serum: 2.3 ng/mL (ref 0.0–4.0)

## 2018-11-20 NOTE — Progress Notes (Signed)
1:51 PM   Dylan Sullivan 1945-12-06 [id number]  Referring provider: Casilda Carls, MD [place], [place] [address]  Chief Complaint  Patient presents with  . Benign Prostatic Hypertrophy    HPI: Patient is a 73 year old Caucasian male with BPH and LUTS and a history of elevated PSA who presents today for 12 month follow-up.   BPH WITH LUTS  (prostate and/or bladder) Refused to fill out I PSS form.    Major complaint(s):  Intermittency that happens on occasion x several months.  Denies any dysuria, hematuria or suprapubic pain.   Currently taking: tamsulosin 0.4 mg daily and finasteride 5 mg daily   Denies any recent fevers, chills, nausea or vomiting.  IPSS    Row Name 11/21/18 1300         International Prostate Symptom Score   How often have you had the sensation of not emptying your bladder?  Not at All     How often have you had to urinate less than every two hours?  Not at All     How often have you found you stopped and started again several times when you urinated?  Not at All     How often have you found it difficult to postpone urination?  Not at All     How often have you had a weak urinary stream?  Not at All     How often have you had to strain to start urination?  Not at All     How many times did you typically get up at night to urinate?  None     Total IPSS Score  0       Quality of Life due to urinary symptoms   If you were to spend the rest of your life with your urinary condition just the way it is now how would you feel about that?  Delighted        Score:  1-7 Mild 8-19 Moderate 20-35 Severe   History of elevated PSA Patient's PSA reached 9.8 ng/mL in November 2015. This was in association with a urinary tract infection. When the urinary tract infection was treated in the PSA repeated, it returned to baseline.  His most recent PSA was 2.3 (4.6) ng/mL on 11/14/2018.    PMH: Past Medical History:  Diagnosis Date  . BP (high  blood pressure) 04/16/2015  . BPH (benign prostatic hyperplasia)   . BPH with obstruction/lower urinary tract symptoms 04/20/2015  . Cancer ([place])    skin  . Carotid artery stenosis 11/11/2016  . Cerebral palsy ([place])   . Elevated PSA   . Hematuria   . Hemiplegia ([place]) 09/13/2011   Overview:  Upper extremity most affected   . History of elevated PSA 04/20/2015  . Hydronephrosis   . Kidney stone   . Nocturia   . Obstructive apnea 12/29/2014  . Osteoporosis   . 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  none since starting keppra  . 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 Seizures ([place])    1956-62. esolved after brain surgery  . Sleep apnea    CPAP  . Urinary retention     Surgical History: Past Surgical History:  Procedure Laterality Date  . APPENDECTOMY    . arm surgery Right    child  . [place]  . CATARACT EXTRACTION W/PHACO Left 09/13/2017   Procedure: CATARACT EXTRACTION PHACO AND INTRAOCULAR LENS PLACEMENT ([facility]) LEFT;  Surgeon: Wallace Going,  Nila Nephew, MD;  Location: [facility];  Service: Ophthalmology;  Laterality: Left;  sleep apnea  . CATARACT EXTRACTION W/PHACO Right 11/28/2017   Procedure: CATARACT EXTRACTION PHACO AND INTRAOCULAR LENS PLACEMENT ([facility]);  Surgeon: Leandrew Koyanagi, MD;  Location: [facility];  Service: Ophthalmology;  Laterality: Right;  [place] 00:55 AP% 9.2 CDE 5.04 Fluid pack lot # [id number]  . LEG SURGERY Right    child  . TONSILLECTOMY      Home Medications:  Allergies as of 11/21/2018      Reactions   Oxycodone-acetaminophen Other (See Comments)   Causes hyperness      Medication List       Accurate as of November 21, 2018  1:51 PM. If you have any questions, ask your nurse or doctor.        acidophilus Caps capsule Take 1 capsule by mouth 2 (two) times daily.   aspirin EC 81 MG tablet Take 81 mg by mouth daily.   CAPSICUM (CAYENNE) PO Take 25 mg by mouth daily.   CRANBERRY PO Take 1 capsule  by mouth daily.   Hoy Register + Energy Max Strength Tabs Take by mouth.   finasteride 5 MG tablet Commonly known as:  PROSCAR Take 1 tablet (5 mg total) by mouth daily.   levETIRAcetam 750 MG tablet Commonly known as:  KEPPRA Take 1 tablet (750 mg total) by mouth 2 (two) times daily.   Linseed Oil Oil Take 59ml by mouth daily   OVER THE COUNTER MEDICATION Take 1 capsule by mouth daily. chanca piedra   OVER THE COUNTER MEDICATION Take 29 mg by mouth daily. Black Strap Molasses   phenytoin 100 MG ER capsule Commonly known as:  DILANTIN Take 300 mg by mouth at bedtime.   tamsulosin 0.4 MG Caps capsule Commonly known as:  FLOMAX Take 1 capsule (0.4 mg total) by mouth daily.   Turmeric 500 MG Caps Take 500 mg by mouth 2 (two) times daily.   vitamin C 500 MG tablet Commonly known as:  ASCORBIC ACID Take 1,000 mg by mouth 2 (two) times daily.   Vitamin D3 1.25 MG (50000 UT) Caps TK ONE C PO ONCE A WEEK       Allergies:  Allergies  Allergen Reactions  . Oxycodone-Acetaminophen Other (See Comments)    Causes hyperness    Family History: Family History  Problem Relation Age of Onset  . Breast cancer Maternal Aunt   . Dementia Mother   . CAD Father   . Prostate cancer Neg Hx   . Bladder Cancer Neg Hx   . Kidney cancer Neg Hx     Social History:  reports that he has never smoked. He has never used smokeless tobacco. He reports that he does not drink alcohol or use drugs.  ROS: UROLOGY Frequent Urination?: No Hard to postpone urination?: No Burning/pain with urination?: No Get up at night to urinate?: No Leakage of urine?: No Urine stream starts and stops?: No Trouble starting stream?: No Do you have to strain to urinate?: No Blood in urine?: No Urinary tract infection?: No Sexually transmitted disease?: No Injury to kidneys or bladder?: No Painful intercourse?: No Weak stream?: No Erection problems?: No Penile pain?: No  Gastrointestinal Nausea?: No  Vomiting?: No Indigestion/heartburn?: No Diarrhea?: No Constipation?: No  Constitutional Fever: No Night sweats?: No Weight loss?: No Fatigue?: No  Skin Skin rash/lesions?: No Itching?: No  Eyes Blurred vision?: No Double vision?: No  Ears/Nose/Throat Sore throat?: No Sinus problems?: No  Hematologic/Lymphatic  Swollen glands?: No Easy bruising?: No  Cardiovascular Leg swelling?: No Chest pain?: No  Respiratory Cough?: No Shortness of breath?: No  Endocrine Excessive thirst?: No  Musculoskeletal Back pain?: No Joint pain?: No  Neurological Headaches?: No Dizziness?: No  Psychologic Depression?: No Anxiety?: No  Physical Exam: BP (!) 146/75 (BP Location: Left Arm, Patient Position: Sitting, Cuff Size: Normal)   Pulse 99   Ht 5' 7.5" (1.715 m)   Wt 179 lb 1.6 oz (81.2 kg)   BMI 27.64 kg/m   Constitutional:  Well nourished. Alert and oriented, No acute distress. HEENT: [place] AT, moist mucus membranes.  Trachea midline, no masses. Cardiovascular: No clubbing, cyanosis, or edema. Respiratory: Normal respiratory effort, no increased work of breathing. GI: Abdomen is soft, non tender, non distended, no abdominal masses. Liver and spleen not palpable.  No hernias appreciated.  Stool sample for occult testing is not indicated.   GU: No CVA tenderness.  No bladder fullness or masses.  Patient with circumcised phallus.  Urethral meatus is patent.  No penile discharge. No penile lesions or rashes. Scrotum without lesions, cysts, rashes and/or edema.  Testicles are located scrotally bilaterally. No masses are appreciated in the testicles. Left and right epididymis are normal. Rectal: Patient with  normal sphincter tone. Anus and perineum without scarring or rashes. No rectal masses are appreciated. Prostate is approximately 45 grams, no nodules are appreciated. Seminal vesicles are normal. Skin: No rashes, bruises or suspicious lesions. Lymph: No cervical or inguinal  adenopathy. Neurologic: Grossly intact, no focal deficits, moving all 4 extremities. Psychiatric: Normal mood and affect.   Laboratory Data: Lab Results  Component Value Date   WBC 6.1 08/04/2017   HGB 13.1 08/04/2017   HCT 37.4 (L) 08/04/2017   MCV 98.8 08/04/2017   PLT 348 08/04/2017    Lab Results  Component Value Date   CREATININE 0.91 08/04/2017    PSA History:  3.8 (7.6) ng/mL on 03/18/2013  2.8 (5.6) ng/mL on 10/04/2013  3.9 (7.8) ng/mL on 04/23/2014  2.8 (5.6) ng/mL on 10/16/2014  2.9 (5.8) ng/mL on 04/20/2015  2.4 (4.8) ng/mL on 10/13/2015  3.2 (6.4) ng/mL on 04/14/2016  2.6 (5.2) ng/mL on 10/11/2016  2.7 (5.4) ng/mL on 10/16/2017  2.3 (4.6) ng/mL on 11/14/2018 I have reviewed the labs.    Assessment & Plan:    1. BPH with LUTS Continue conservative management, avoiding bladder irritants and timed voiding's Occasional intermittency, not bothersome Continue tamsulosin 0.4 mg daily and finasteride 5 mg daily RTC in 12 months for IPSS, PSA and exam   2. History of elevated PSA Current PSA stable at 2.3 (4.6) RTC in one year for PSA   Return in about 1 year (around 11/21/2019) for IPSS, PSA and exam.  Zara Council, [facility]  [address] [place], [place] [address] [phone]

## 2018-11-21 ENCOUNTER — Other Ambulatory Visit: Payer: Self-pay

## 2018-11-21 ENCOUNTER — Ambulatory Visit (INDEPENDENT_AMBULATORY_CARE_PROVIDER_SITE_OTHER): Payer: Medicare Other | Admitting: Urology

## 2018-11-21 ENCOUNTER — Encounter: Payer: Self-pay | Admitting: Urology

## 2018-11-21 VITALS — BP 146/75 | HR 99 | Ht 67.5 in | Wt 179.1 lb

## 2018-11-21 DIAGNOSIS — Z87898 Personal history of other specified conditions: Secondary | ICD-10-CM

## 2018-11-21 DIAGNOSIS — N138 Other obstructive and reflux uropathy: Secondary | ICD-10-CM | POA: Diagnosis not present

## 2018-11-21 DIAGNOSIS — N401 Enlarged prostate with lower urinary tract symptoms: Secondary | ICD-10-CM | POA: Diagnosis not present

## 2018-11-21 MED ORDER — FINASTERIDE 5 MG PO TABS: 5.0000 mg | ORAL_TABLET | Freq: Every day | ORAL | 12 refills | Status: DC

## 2018-11-21 MED ORDER — TAMSULOSIN HCL 0.4 MG PO CAPS: 0.4000 mg | ORAL_CAPSULE | Freq: Every day | ORAL | 11 refills | Status: DC

## 2018-12-10 ENCOUNTER — Encounter: Payer: Self-pay | Admitting: Internal Medicine

## 2018-12-10 ENCOUNTER — Telehealth: Payer: Self-pay | Admitting: Gastroenterology

## 2018-12-10 ENCOUNTER — Telehealth: Payer: Self-pay

## 2018-12-10 NOTE — Telephone Encounter (Signed)
LVM for pt to call office to schedule colonoscopy.  Thanks [employer]

## 2018-12-10 NOTE — Telephone Encounter (Signed)
Patient is returning call to [facility] to schedule colonoscopy.

## 2018-12-11 ENCOUNTER — Other Ambulatory Visit: Payer: Self-pay

## 2018-12-11 DIAGNOSIS — Z86010 Personal history of colonic polyps: Secondary | ICD-10-CM

## 2018-12-11 DIAGNOSIS — Z1211 Encounter for screening for malignant neoplasm of colon: Secondary | ICD-10-CM

## 2018-12-11 NOTE — Telephone Encounter (Signed)
Gastroenterology Pre-Procedure Review  Request Date: 12/28/18 Requesting Physician: Dr. Bonna Gains  PATIENT REVIEW QUESTIONS: The patient responded to the following health history questions as indicated:    1. Are you having any GI issues? no 2. Do you have a personal history of Polyps? yes (Noted in chart office visit Polyps 2015 Colonoscopy) 3. Do you have a family history of Colon Cancer or Polyps? no 4. Diabetes Mellitus? no 5. Joint replacements in the past 12 months?no 6. Major health problems in the past 3 months?no 7. Any artificial heart valves, MVP, or defibrillator?no    MEDICATIONS & ALLERGIES:    Patient reports the following regarding taking any anticoagulation/antiplatelet therapy:   Plavix, Coumadin, Eliquis, Xarelto, Lovenox, Pradaxa, Brilinta, or Effient? no Aspirin? no  Patient confirms/reports the following medications:  Current Outpatient Medications  Medication Sig Dispense Refill  . acidophilus (RISAQUAD) CAPS capsule Take 1 capsule by mouth 2 (two) times daily.     Marland Kitchen aspirin EC 81 MG tablet Take 81 mg by mouth daily.    Marland Kitchen CAPSICUM, CAYENNE, PO Take 25 mg by mouth daily.     . Cholecalciferol (VITAMIN D3) 1.25 MG (50000 UT) CAPS TK ONE C PO ONCE A WEEK    . CRANBERRY PO Take 1 capsule by mouth daily.     . finasteride (PROSCAR) 5 MG tablet Take 1 tablet (5 mg total) by mouth daily. 30 tablet 12  . Flaxseed Oil (LINSEED OIL) OIL Take 68ml by mouth daily    . levETIRAcetam (KEPPRA) 750 MG tablet Take 1 tablet (750 mg total) by mouth 2 (two) times daily. 60 tablet 0  . Misc Natural Products (ESTROVEN + ENERGY MAX STRENGTH) TABS Take by mouth.    Marland Kitchen OVER THE COUNTER MEDICATION Take 1 capsule by mouth daily. chanca piedra     . OVER THE COUNTER MEDICATION Take 29 mg by mouth daily. Black Strap Molasses    . phenytoin (DILANTIN) 100 MG ER capsule Take 300 mg by mouth at bedtime.     . tamsulosin (FLOMAX) 0.4 MG CAPS capsule Take 1 capsule (0.4 mg total) by mouth  daily. 30 capsule 11  . Turmeric 500 MG CAPS Take 500 mg by mouth 2 (two) times daily.     . vitamin C (ASCORBIC ACID) 500 MG tablet Take 1,000 mg by mouth 2 (two) times daily.      No current facility-administered medications for this visit.     Patient confirms/reports the following allergies:  Allergies  Allergen Reactions  . Oxycodone-Acetaminophen Other (See Comments)    Causes hyperness    No orders of the defined types were placed in this encounter.   AUTHORIZATION INFORMATION Primary Insurance: 1D#: Group #:  Secondary Insurance: 1D#: Group #:  SCHEDULE INFORMATION: Date: 12/28/18 Time: Location:[facility]

## 2018-12-24 ENCOUNTER — Telehealth: Payer: Self-pay | Admitting: Gastroenterology

## 2018-12-24 NOTE — Telephone Encounter (Signed)
Pt is calling he would like to see Dr. Fontaine No before having his procedure 12/28/18 we have schedule him face to face apt for 01/17/19 please reschedule his procedure

## 2018-12-25 ENCOUNTER — Other Ambulatory Visit: Admission: RE | Admit: 2018-12-25 | Payer: Medicare Other | Source: Ambulatory Visit

## 2018-12-26 NOTE — Telephone Encounter (Signed)
Spoke with pt and procedure has been cancelled and pt will go to appt as scheduled on 01/17/2019, pt would like to discuss procedure with provider before rescheduling

## 2018-12-28 ENCOUNTER — Ambulatory Visit: Admission: RE | Admit: 2018-12-28 | Payer: Medicare Other | Source: Home / Self Care | Admitting: Gastroenterology

## 2018-12-28 ENCOUNTER — Encounter: Admission: RE | Payer: Self-pay | Source: Home / Self Care

## 2018-12-28 SURGERY — COLONOSCOPY WITH PROPOFOL
Anesthesia: General

## 2019-01-17 ENCOUNTER — Encounter (INDEPENDENT_AMBULATORY_CARE_PROVIDER_SITE_OTHER): Payer: Self-pay

## 2019-01-17 ENCOUNTER — Ambulatory Visit (INDEPENDENT_AMBULATORY_CARE_PROVIDER_SITE_OTHER): Payer: Medicare Other | Admitting: Gastroenterology

## 2019-01-17 ENCOUNTER — Other Ambulatory Visit: Payer: Self-pay

## 2019-01-17 ENCOUNTER — Encounter: Payer: Self-pay | Admitting: Gastroenterology

## 2019-01-17 VITALS — BP 139/77 | HR 116 | Temp 98.4°F | Ht 67.0 in | Wt 176.2 lb

## 2019-01-17 DIAGNOSIS — D508 Other iron deficiency anemias: Secondary | ICD-10-CM

## 2019-01-17 DIAGNOSIS — R748 Abnormal levels of other serum enzymes: Secondary | ICD-10-CM

## 2019-01-17 NOTE — Progress Notes (Signed)
°  °Dylan Tahiliani, MD °[address]  °[address]  °[place], [place] [address]  °Main: [phone]  °Fax: [phone] ° ° °Primary Care Physician: Jadali, Fayegh, MD ° ° °Chief Complaint  °Patient presents with  °• New Patient (Initial Visit)  °  Discuss Colonoscopy Referral  ° ° °HPI: Dylan Sullivan is a 73 y.o. male referred to us for a colonoscopy.  Patient is on iron replacement orally by primary care provider for iron deficiency.  Colonoscopy and EGD were discussed before with him during his initial visit with us last year, but patient refused and did not schedule. ° °The patient denies abdominal or flank pain, anorexia, nausea or vomiting, dysphagia, change in bowel habits or black or bloody stools or weight loss. ° °We had obtained anemia work-up which showed a normal ferritin at 56 in Jan 2019.  Iron level mildly low at 40.  Total iron binding capacity low at 231.  Saturation ratio is low at 17.  °  °Last colonoscopy was in November 2015 by Dr. Wohl.  3 mm polyp removed from the ascending colon.  Found to be lymphoid aggregate with germinal center formation.  No previous EGDs. °  ° °Current Outpatient Medications  °Medication Sig Dispense Refill  °• acidophilus (RISAQUAD) CAPS capsule Take 1 capsule by mouth 2 (two) times daily.     °• aspirin EC 81 MG tablet Take 81 mg by mouth daily.    °• CAPSICUM, CAYENNE, PO Take 25 mg by mouth daily.     °• Cholecalciferol (VITAMIN D3) 1.25 MG (50000 UT) CAPS TK ONE C PO ONCE A WEEK    °• Coenzyme Q10 (COQ10) 200 MG CAPS Take by mouth.    °• CRANBERRY PO Take 1 capsule by mouth daily.     °• finasteride (PROSCAR) 5 MG tablet Take 1 tablet (5 mg total) by mouth daily. 30 tablet 12  °• Flaxseed Oil (LINSEED OIL) OIL Take 15ml by mouth daily    °• levETIRAcetam (KEPPRA) 750 MG tablet Take 1 tablet (750 mg total) by mouth 2 (two) times daily. 60 tablet 0  °• Misc Natural Products (ESTROVEN + ENERGY MAX STRENGTH) TABS Take by mouth.    °• OVER THE COUNTER  MEDICATION Take 1 capsule by mouth daily. chanca piedra     °• OVER THE COUNTER MEDICATION Take 29 mg by mouth daily. Black Strap Molasses    °• phenytoin (DILANTIN) 100 MG ER capsule Take 300 mg by mouth at bedtime.     °• tamsulosin (FLOMAX) 0.4 MG CAPS capsule Take 1 capsule (0.4 mg total) by mouth daily. 30 capsule 11  °• Turmeric 500 MG CAPS Take 500 mg by mouth 2 (two) times daily.     °• vitamin C (ASCORBIC ACID) 500 MG tablet Take 1,000 mg by mouth 2 (two) times daily.     ° °No current facility-administered medications for this visit.   ° ° °Allergies as of 01/17/2019 - Review Complete 01/17/2019  °Allergen Reaction Noted  °• Oxycodone-acetaminophen Other (See Comments) 01/16/2017  ° ° °ROS: ° °General: Negative for anorexia, weight loss, fever, chills, fatigue, weakness. °ENT: Negative for hoarseness, difficulty swallowing , nasal congestion. °CV: Negative for chest pain, angina, palpitations, dyspnea on exertion, peripheral edema.  °Respiratory: Negative for dyspnea at rest, dyspnea on exertion, cough, sputum, wheezing.  °GI: See history of present illness. °GU:  Negative for dysuria, hematuria, urinary incontinence, urinary frequency, nocturnal urination.  °Endo: Negative for unusual weight change.  °  °Physical Examination: ° °   BP 139/77    Pulse (!) 116    Temp 98.4 F (36.9 C) (Oral)    Ht 5' 7" (1.702 m)    Wt 176 lb 3.2 oz (79.9 kg)    BMI 27.60 kg/m   General: Well-nourished, well-developed in no acute distress.  Eyes: No icterus. Conjunctivae pink. Mouth: Oropharyngeal mucosa moist and pink , no lesions erythema or exudate. Neck: Supple, Trachea midline Abdomen: Bowel sounds are normal, nontender, nondistended, no hepatosplenomegaly or masses, no abdominal bruits or hernia , no rebound or guarding.   Extremities: No lower extremity edema. No clubbing or deformities. Neuro: Alert and oriented x 3.  Grossly intact. Skin: Warm and dry, no jaundice.   Psych: Alert and cooperative, normal  mood and affect.   Labs: CMP     Component Value Date/Time   NA 141 08/04/2017 0447   NA 136 09/09/2012 1330   K 3.8 08/04/2017 0447   K 3.6 09/09/2012 1330   CL 111 08/04/2017 0447   CL 99 09/09/2012 1330   CO2 23 08/04/2017 0447   CO2 30 09/09/2012 1330   GLUCOSE 100 (H) 08/04/2017 0447   GLUCOSE 124 (H) 09/09/2012 1330   BUN 15 08/04/2017 0447   BUN 19 (H) 09/09/2012 1330   CREATININE 0.91 08/04/2017 0447   CREATININE 1.15 09/09/2012 1330   CALCIUM 9.2 08/04/2017 0447   CALCIUM 10.2 (H) 09/09/2012 1330   PROT 7.7 08/03/2017 1135   ALBUMIN 4.0 08/03/2017 1135   AST 16 08/03/2017 1135   ALT 13 (L) 08/03/2017 1135   ALKPHOS 92 08/03/2017 1135   BILITOT 0.5 08/03/2017 1135   GFRNONAA >60 08/04/2017 0447   GFRAA >60 08/04/2017 0447   Lab Results  Component Value Date   WBC 6.1 08/04/2017   HGB 13.1 08/04/2017   HCT 37.4 (L) 08/04/2017   MCV 98.8 08/04/2017   PLT 348 08/04/2017    Imaging Studies: No results found.  Assessment and Plan:   Dylan Sullivan is a 73 y.o. y/o male on iron replacement, orally with normal hemoglobin on June 2020 labs, referred for colonoscopy  We discussed need for EGD and colonoscopy for iron deficiency anemia  Patient does not want to schedule at this time and states he will think about it and call us back once he decides.  We discussed extensively that he may have an underlying malignancy or lesions such as ulcers or AVMs that may need treatment, and therefore procedures are recommended.  Patient refuses scheduling at this time and understands these risks.  His alk phos was mildly elevated to the 120s on June 2020 labs.  We will obtain hepatic function panel to see if it is normal now  Follow-up in 3 months to reassess if he is agreeable.  However, in the meantime have asked him to consider procedures and if he changes his mind to call us and schedule.  Continue follow-up with Dr. Rosario Jacks    Dr Vonda Antigua

## 2019-01-18 LAB — HEPATIC FUNCTION PANEL
ALT: 10 IU/L (ref 0–44)
AST: 9 IU/L (ref 0–40)
Albumin: 4 g/dL (ref 3.7–4.7)
Alkaline Phosphatase: 113 IU/L (ref 39–117)
Bilirubin Total: <0.2 mg/dL (ref 0.0–1.2)
Bilirubin, Direct: 0.06 mg/dL (ref 0.00–0.40)
Total Protein: 7.1 g/dL (ref 6.0–8.5)

## 2019-02-04 ENCOUNTER — Telehealth: Payer: Self-pay

## 2019-02-04 ENCOUNTER — Telehealth: Payer: Self-pay | Admitting: Gastroenterology

## 2019-02-04 NOTE — Telephone Encounter (Signed)
Left pt general message that labs were normal and if any further questions to please contact office. Also, front desk to contact pt for f/u appt in 3 months.

## 2019-02-04 NOTE — Telephone Encounter (Signed)
LEFT VM TO OFFER 3 month f/u to discuss scheduling EGD/Colonoscopy PER DEBBIE'S NOTE

## 2019-02-04 NOTE — Telephone Encounter (Signed)
-----   Message from Virgel Manifold, MD sent at 01/29/2019  1:38 PM EDT ----- Jackelyn Poling please let patient know, his liver enzymes are normal.  If he has not scheduled his procedures as we had discussed with him in clinic, please set clinic follow-up in 3 months.

## 2019-05-08 ENCOUNTER — Other Ambulatory Visit: Payer: Self-pay

## 2019-05-08 ENCOUNTER — Ambulatory Visit (INDEPENDENT_AMBULATORY_CARE_PROVIDER_SITE_OTHER): Payer: Medicare Other | Admitting: Gastroenterology

## 2019-05-08 ENCOUNTER — Encounter: Payer: Self-pay | Admitting: Gastroenterology

## 2019-05-08 VITALS — BP 153/84 | HR 109 | Temp 98.6°F | Wt 186.0 lb

## 2019-05-08 DIAGNOSIS — D649 Anemia, unspecified: Secondary | ICD-10-CM | POA: Diagnosis not present

## 2019-05-08 MED ORDER — METAMUCIL SMOOTH TEXTURE 58.6 % PO POWD: 1.0000 | Freq: Every day | ORAL | 1 refills | Status: DC

## 2019-05-08 NOTE — Progress Notes (Signed)
Vonda Antigua, MD [address]  [place]  [place], [place] [address]  Main: [phone]  Fax: [phone]   Primary Care Physician: Casilda Carls, MD   Chief Complaint  Patient presents with  . Anemia    Having no symptoms     HPI: Dylan Sullivan is a 73 y.o. male previously referred to [place] for iron deficiency anemia and EGD and colonoscopy were discussed.  Patient refused the procedure last time and is here to be discussed if he is ready to schedule at this time.  The patient denies abdominal or flank pain, anorexia, nausea or vomiting, dysphagia, change in bowel habits or black or bloody stools or weight loss.   Current Outpatient Medications  Medication Sig Dispense Refill  . acidophilus (RISAQUAD) CAPS capsule Take 1 capsule by mouth 2 (two) times daily.     Marland Kitchen aspirin EC 81 MG tablet Take 81 mg by mouth daily.    Marland Kitchen CAPSICUM, CAYENNE, PO Take 25 mg by mouth daily.     . Cholecalciferol (VITAMIN D3) 1.25 MG (50000 UT) CAPS TK ONE C PO ONCE A WEEK    . Coenzyme Q10 (COQ10) 200 MG CAPS Take by mouth.    . CRANBERRY PO Take 1 capsule by mouth daily.     . finasteride (PROSCAR) 5 MG tablet Take 1 tablet (5 mg total) by mouth daily. 30 tablet 12  . Flaxseed Oil (LINSEED OIL) OIL Take 81ml by mouth daily    . levETIRAcetam (KEPPRA) 750 MG tablet Take 1 tablet (750 mg total) by mouth 2 (two) times daily. 60 tablet 0  . Misc Natural Products (ESTROVEN + ENERGY MAX STRENGTH) TABS Take by mouth.    Marland Kitchen OVER THE COUNTER MEDICATION Take 1 capsule by mouth daily. chanca piedra     . OVER THE COUNTER MEDICATION Take 29 mg by mouth daily. Black Strap Molasses    . phenytoin (DILANTIN) 100 MG ER capsule Take 300 mg by mouth at bedtime.     . tamsulosin (FLOMAX) 0.4 MG CAPS capsule Take 1 capsule (0.4 mg total) by mouth daily. 30 capsule 11  . Turmeric 500 MG CAPS Take 500 mg by mouth 2 (two) times daily.     . vitamin C (ASCORBIC ACID) 500 MG tablet Take 1,000 mg by mouth 2  (two) times daily.      No current facility-administered medications for this visit.     Allergies as of 05/08/2019 - Review Complete 05/08/2019  Allergen Reaction Noted  . Oxycodone-acetaminophen Other (See Comments) 01/16/2017    ROS:  General: Negative for anorexia, weight loss, fever, chills, fatigue, weakness. ENT: Negative for hoarseness, difficulty swallowing , nasal congestion. CV: Negative for chest pain, angina, palpitations, dyspnea on exertion, peripheral edema.  Respiratory: Negative for dyspnea at rest, dyspnea on exertion, cough, sputum, wheezing.  GI: See history of present illness. GU:  Negative for dysuria, hematuria, urinary incontinence, urinary frequency, nocturnal urination.  Endo: Negative for unusual weight change.    Physical Examination:   BP (!) 153/84 (BP Location: Left Arm, Patient Position: Sitting, Cuff Size: Normal)   Pulse (!) 109   Temp 98.6 F (37 C) (Oral)   Wt 186 lb (84.4 kg)   BMI 29.13 kg/m   General: Well-nourished, well-developed in no acute distress.  Eyes: No icterus. Conjunctivae pink. Mouth: Oropharyngeal mucosa moist and pink , no lesions erythema or exudate. Neck: Supple, Trachea midline Abdomen: Bowel sounds are normal, nontender, nondistended, no hepatosplenomegaly or masses, no  abdominal bruits or hernia , no rebound or guarding.   Extremities: No lower extremity edema. No clubbing or deformities. Neuro: Alert and oriented x 3.  Grossly intact. Skin: Warm and dry, no jaundice.   Psych: Alert and cooperative, normal mood and affect.   Labs: CMP     Component Value Date/Time   NA 141 08/04/2017 0447   NA 136 09/09/2012 1330   K 3.8 08/04/2017 0447   K 3.6 09/09/2012 1330   CL 111 08/04/2017 0447   CL 99 09/09/2012 1330   CO2 23 08/04/2017 0447   CO2 30 09/09/2012 1330   GLUCOSE 100 (H) 08/04/2017 0447   GLUCOSE 124 (H) 09/09/2012 1330   BUN 15 08/04/2017 0447   BUN 19 (H) 09/09/2012 1330   CREATININE 0.91  08/04/2017 0447   CREATININE 1.15 09/09/2012 1330   CALCIUM 9.2 08/04/2017 0447   CALCIUM 10.2 (H) 09/09/2012 1330   PROT 7.1 01/17/2019 1330   ALBUMIN 4.0 01/17/2019 1330   AST 9 01/17/2019 1330   ALT 10 01/17/2019 1330   ALKPHOS 113 01/17/2019 1330   BILITOT <0.2 01/17/2019 1330   GFRNONAA >60 08/04/2017 0447   GFRAA >60 08/04/2017 0447   Lab Results  Component Value Date   WBC 6.1 08/04/2017   HGB 13.1 08/04/2017   HCT 37.4 (L) 08/04/2017   MCV 98.8 08/04/2017   PLT 348 08/04/2017    Imaging Studies: No results found.  Assessment and Plan:   Dylan Sullivan is a 73 y.o. y/o male initially referred to [place] for iron deficiency anemia and colonoscopy and EGD were recommended and patient refused at that time  Extensively discussed repeat procedures and ruling out any underlying lesions including malignancy  Patient remains uninterested in scheduling procedures and understands the risks and benefits  He would like to follow-up with [place] if he changes his mind future, and would like to continue follow-up with his primary care provider  Over 50% of a 30-minute visit spent in counseling the patient  Dr Vonda Antigua

## 2019-08-13 DIAGNOSIS — G4733 Obstructive sleep apnea (adult) (pediatric): Secondary | ICD-10-CM | POA: Insufficient documentation

## 2019-11-13 ENCOUNTER — Other Ambulatory Visit: Payer: Self-pay

## 2019-11-13 DIAGNOSIS — N138 Other obstructive and reflux uropathy: Secondary | ICD-10-CM

## 2019-11-13 DIAGNOSIS — N401 Enlarged prostate with lower urinary tract symptoms: Secondary | ICD-10-CM

## 2019-11-14 ENCOUNTER — Other Ambulatory Visit: Payer: Medicare Other

## 2019-11-14 ENCOUNTER — Other Ambulatory Visit: Payer: Self-pay

## 2019-11-14 DIAGNOSIS — N401 Enlarged prostate with lower urinary tract symptoms: Secondary | ICD-10-CM

## 2019-11-14 DIAGNOSIS — N138 Other obstructive and reflux uropathy: Secondary | ICD-10-CM

## 2019-11-15 LAB — PSA: Prostate Specific Ag, Serum: 1.9 ng/mL (ref 0.0–4.0)

## 2019-11-21 ENCOUNTER — Ambulatory Visit (INDEPENDENT_AMBULATORY_CARE_PROVIDER_SITE_OTHER): Payer: Medicare Other | Admitting: Urology

## 2019-11-21 ENCOUNTER — Encounter: Payer: Self-pay | Admitting: Urology

## 2019-11-21 ENCOUNTER — Other Ambulatory Visit: Payer: Self-pay

## 2019-11-21 VITALS — BP 143/79 | HR 98 | Ht 67.0 in | Wt 185.0 lb

## 2019-11-21 DIAGNOSIS — N401 Enlarged prostate with lower urinary tract symptoms: Secondary | ICD-10-CM | POA: Diagnosis not present

## 2019-11-21 DIAGNOSIS — N138 Other obstructive and reflux uropathy: Secondary | ICD-10-CM

## 2019-11-21 LAB — URINALYSIS, COMPLETE
Bilirubin, UA: NEGATIVE
Glucose, UA: NEGATIVE
Ketones, UA: NEGATIVE
Nitrite, UA: POSITIVE — AB
Specific Gravity, UA: 1.02 (ref 1.005–1.030)
Urobilinogen, Ur: 0.2 mg/dL (ref 0.2–1.0)
pH, UA: 6.5 (ref 5.0–7.5)

## 2019-11-21 LAB — MICROSCOPIC EXAMINATION: WBC, UA: >30 /hpf — AB (ref 0–5)

## 2019-11-21 MED ORDER — FINASTERIDE 5 MG PO TABS: 5.0000 mg | ORAL_TABLET | Freq: Every day | ORAL | 12 refills | Status: AC

## 2019-11-21 MED ORDER — TAMSULOSIN HCL 0.4 MG PO CAPS: 0.4000 mg | ORAL_CAPSULE | Freq: Every day | ORAL | 12 refills | Status: AC

## 2019-11-21 NOTE — Progress Notes (Signed)
3:30 PM   Dylan Sullivan 11/28/1945 [id number]  Referring provider: Casilda Carls, MD [place],  [place] [address]  Chief Complaint  Patient presents with  . Benign Prostatic Hypertrophy    HPI: Patient is a 74 year old male with BPH and LUTS and a history of elevated PSA who presents today for 12 month follow-up.   BPH WITH LUTS  (prostate and/or bladder) I PSS: 4/0    Major complaint(s): Frequency.  Patient denies any modifying or aggravating factors.  Patient denies any gross hematuria, dysuria or suprapubic/flank pain.  Patient denies any fevers, chills, nausea or vomiting.   Currently taking: tamsulosin 0.4 mg daily and finasteride 5 mg daily.    UA nitrite positive, > 30 WBC' and moderate bacteria.  Asymptomatic at this visit.     IPSS    Row Name 11/21/19 1300         International Prostate Symptom Score   How often have you had the sensation of not emptying your bladder? Not at All     How often have you had to urinate less than every two hours? Less than 1 in 5 times     How often have you found you stopped and started again several times when you urinated? Less than 1 in 5 times     How often have you found it difficult to postpone urination? Not at All     How often have you had a weak urinary stream? Not at All     How often have you had to strain to start urination? Not at All     How many times did you typically get up at night to urinate? 2 Times     Total IPSS Score 4       Quality of Life due to urinary symptoms   If you were to spend the rest of your life with your urinary condition just the way it is now how would you feel about that? Delighted            Score:  1-7 Mild 8-19 Moderate 20-35 Severe   History of elevated PSA Patient's PSA reached 9.8 ng/mL in November 2015. This was in association with a urinary tract infection. When the urinary tract infection was treated in the PSA repeated, it returned to baseline.  His most  recent PSA was 1.9 (3.8) ng/mL on 11/14/2019.    PMH: Past Medical History:  Diagnosis Date  . BP (high blood pressure) 04/16/2015  . BPH (benign prostatic hyperplasia)   . BPH with obstruction/lower urinary tract symptoms 04/20/2015  . Cancer ([place])    skin  . Carotid artery stenosis 11/11/2016  . Cerebral palsy ([place])   . Elevated PSA   . Hematuria   . Hemiplegia ([place]) 09/13/2011   Overview:  Upper extremity most affected   . History of elevated PSA 04/20/2015  . Hydronephrosis   . Kidney stone   . Nocturia   . Obstructive apnea 12/29/2014  . Osteoporosis   . 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  none since starting keppra  . 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 Seizures ([place])    1956-62. esolved after brain surgery  . Sleep apnea    CPAP  . Urinary retention     Surgical History: Past Surgical History:  Procedure Laterality Date  . APPENDECTOMY    . arm surgery Right    child  . [place]  . CATARACT  EXTRACTION W/PHACO Left 09/13/2017   Procedure: CATARACT EXTRACTION PHACO AND INTRAOCULAR LENS PLACEMENT ([place]) LEFT;  Surgeon: Leandrew Koyanagi, MD;  Location: [facility];  Service: Ophthalmology;  Laterality: Left;  sleep apnea  . CATARACT EXTRACTION W/PHACO Right 11/28/2017   Procedure: CATARACT EXTRACTION PHACO AND INTRAOCULAR LENS PLACEMENT ([facility]);  Surgeon: Leandrew Koyanagi, MD;  Location: [facility];  Service: Ophthalmology;  Laterality: Right;  [place] 00:55 AP% 9.2 CDE 5.04 Fluid pack lot # [id number]  . LEG SURGERY Right    child  . TONSILLECTOMY      Home Medications:  Allergies as of 11/21/2019      Reactions   Oxycodone-acetaminophen Other (See Comments)   Causes hyperness      Medication List       Accurate as of November 21, 2019 11:59 PM. If you have any questions, ask your nurse or doctor.        acidophilus Caps capsule Take 1 capsule by mouth 2 (two) times daily.   aspirin EC 81 MG tablet Take  81 mg by mouth daily.   CAPSICUM (CAYENNE) PO Take 25 mg by mouth daily.   CoQ10 200 MG Caps Take by mouth.   CRANBERRY PO Take 1 capsule by mouth daily.   [place] + Energy Max Strength Tabs Take by mouth.   finasteride 5 MG tablet Commonly known as: PROSCAR Take 1 tablet (5 mg total) by mouth daily.   levETIRAcetam 750 MG tablet Commonly known as: KEPPRA Take 1 tablet (750 mg total) by mouth 2 (two) times daily.   Linseed Oil Oil Take 53ml by mouth daily   OVER THE COUNTER MEDICATION Take 1 capsule by mouth daily. chanca piedra   OVER THE COUNTER MEDICATION Take 29 mg by mouth daily. Black Strap Molasses   phenytoin 100 MG ER capsule Commonly known as: DILANTIN Take 300 mg by mouth at bedtime.   tamsulosin 0.4 MG Caps capsule Commonly known as: FLOMAX Take 1 capsule (0.4 mg total) by mouth daily.   Turmeric 500 MG Caps Take 500 mg by mouth 2 (two) times daily.   Turmeric Complex/Black Pepper 3-500 MG Caps Generic drug: Black Pepper-Turmeric Take by mouth.   vitamin C 500 MG tablet Commonly known as: ASCORBIC ACID Take 1,000 mg by mouth 2 (two) times daily.   Vitamin D3 1.25 MG (50000 UT) Caps TK ONE C PO ONCE A WEEK       Allergies:  Allergies  Allergen Reactions  . Oxycodone-Acetaminophen Other (See Comments)    Causes hyperness    Family History: Family History  Problem Relation Age of Onset  . Breast cancer Maternal Aunt   . Dementia Mother   . CAD Father   . Prostate cancer Neg Hx   . Bladder Cancer Neg Hx   . Kidney cancer Neg Hx     Social History:  reports that he has never smoked. He has never used smokeless tobacco. He reports that he does not drink alcohol and does not use drugs.  ROS: For pertinent review of systems please refer to history of present illness  Physical Exam: BP (!) 143/79   Pulse 98   Ht 5\' 7"  (1.702 m)   Wt 185 lb (83.9 kg)   BMI 28.98 kg/m   Constitutional:  Well nourished. Alert and oriented, No  acute distress. HEENT: [place] AT, mask in place  Trachea midline Cardiovascular: No clubbing, cyanosis, or edema. Respiratory: Normal respiratory effort, no increased work of breathing. GI: Abdomen is soft, non  tender, non distended, no abdominal masses. Liver and spleen not palpable.  No hernias appreciated.  Stool sample for occult testing is not indicated.   GU: No CVA tenderness.  No bladder fullness or masses.  Patient with circumcised phallus.   Urethral meatus is patent.  No penile discharge. No penile lesions or rashes. Scrotum without lesions, cysts, rashes and/or edema.  Testicles are located scrotally bilaterally. No masses are appreciated in the testicles. Left and right epididymis are normal. Rectal: Patient with  normal sphincter tone. Anus and perineum without scarring or rashes. No rectal masses are appreciated. Prostate is approximately 65 grams, could only apex and midportion of the gland, no nodules are appreciated. Seminal vesicles could not be palpated.   Skin: No rashes, bruises or suspicious lesions. Lymph: No cervical or inguinal adenopathy. Neurologic: Grossly intact, no focal deficits, moving all 4 extremities.  Right hand with paralysis.  Psychiatric: Normal mood and affect.   Laboratory Data: Urinalysis Component     Latest Ref Rng & Units 11/21/2019  Specific Gravity, UA     1.005 - 1.030 1.020  pH, UA     5.0 - 7.5 6.5  Color, UA     Yellow Yellow  Appearance Ur     Clear Cloudy (A)  Leukocytes,UA     Negative 3+ (A)  Protein,UA     Negative/Trace 1+ (A)  Glucose, UA     Negative Negative  Ketones, UA     Negative Negative  RBC, UA     Negative Trace (A)  Bilirubin, UA     Negative Negative  Urobilinogen, Ur     0.2 - 1.0 mg/dL 0.2  Nitrite, UA     Negative Positive (A)  Microscopic Examination      See below:   Component     Latest Ref Rng & Units 11/21/2019  WBC, UA     0 - 5 /hpf >30 (A)  RBC     0 - 2 /hpf 0-2  Epithelial Cells (non  renal)     0 - 10 /hpf 0-10  Bacteria, UA     None seen/Few Moderate (A)   PSA History:  3.8 (7.6) ng/mL on 03/18/2013  2.8 (5.6) ng/mL on 10/04/2013  3.9 (7.8) ng/mL on 04/23/2014  2.8 (5.6) ng/mL on 10/16/2014  2.9 (5.8) ng/mL on 04/20/2015  2.4 (4.8) ng/mL on 10/13/2015  3.2 (6.4) ng/mL on 04/14/2016  2.6 (5.2) ng/mL on 10/11/2016  2.7 (5.4) ng/mL on 10/16/2017  2.3 (4.6) ng/mL on 11/14/2018  1.9 (3.8) ng/mL on 11/14/2019 I have reviewed the labs.    Assessment & Plan:    1. BPH with LU TS I PSS 4/0 Continue conservative management, avoiding bladder irritants and timed voiding's Continue tamsulosin 0.4 mg daily and finasteride 5 mg daily RTC in 12 months for IPSS, PSA and exam   2. History of elevated PSA Current PSA stable at 1.9 (3.8) RTC in one year for PSA   Return in about 1 year (around 11/20/2020) for IPSS, PSA and exam.  Zara Council, [facility]  [facility] [address] [place], [place] [address] [phone]

## 2020-01-09 IMAGING — CT CT HEAD W/O CM
3 series · 15 of 47 positions shown, 18 images · non-contrast
Comparison: MRI brain 06/16/2016

CLINICAL DATA: Dizziness beginning this morning. History of
cerebral palsy and baseline right-sided weakness. Previous brain
surgery for seizures.

EXAM:
CT HEAD WITHOUT CONTRAST
TECHNIQUE: Contiguous axial images were obtained from the base of the skull
through the vertex without intravenous contrast.

[Series 2: head (person_name) (person_name) · axial · 0.43mm/px · z∈[+531,+656]mm · 9 of 31 slices shown, 12 images]
[im 3/31  brain]
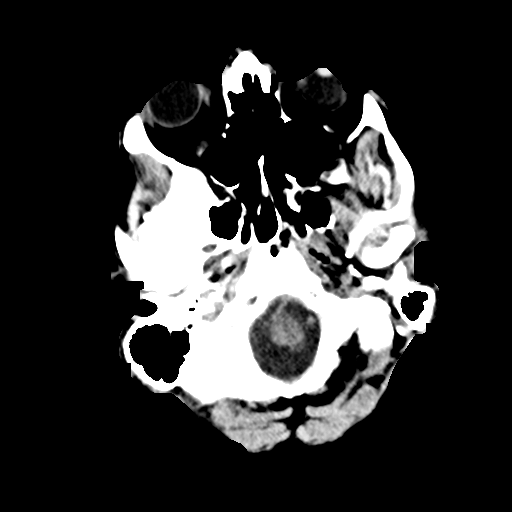
[im 3/31  bone]
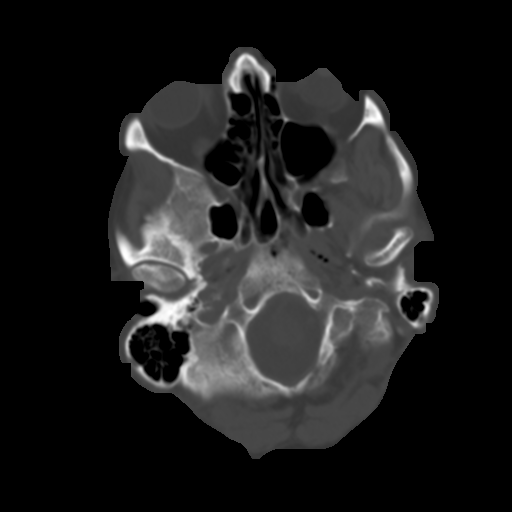
[im 6/31  brain]
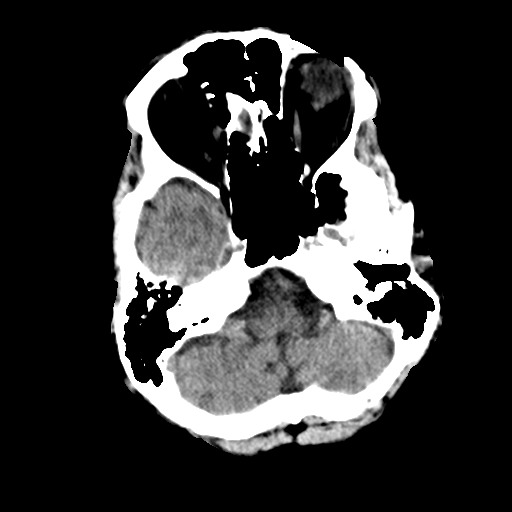
[im 9/31  brain]
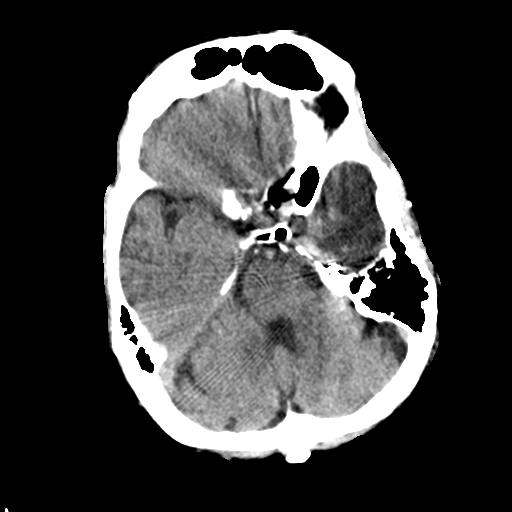
[im 12/31  brain]
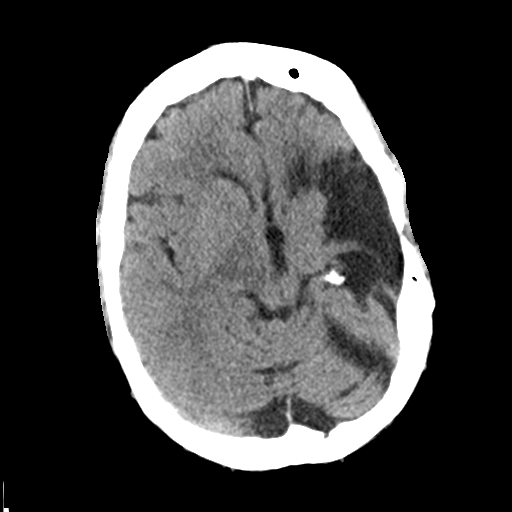
[im 16/31  brain]
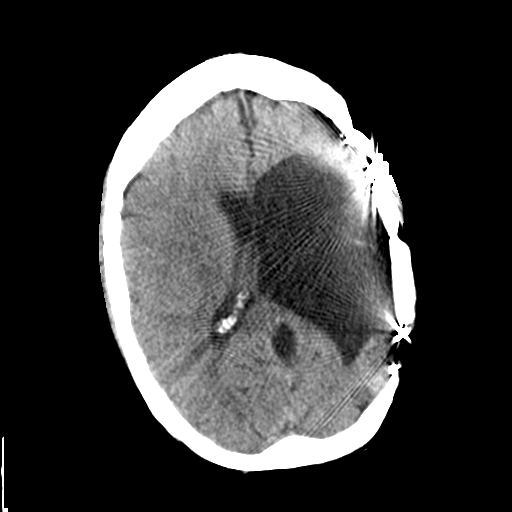
[im 16/31  bone]
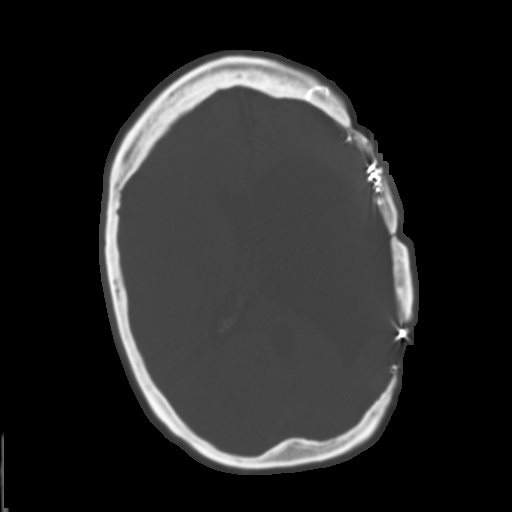
[im 19/31  brain]
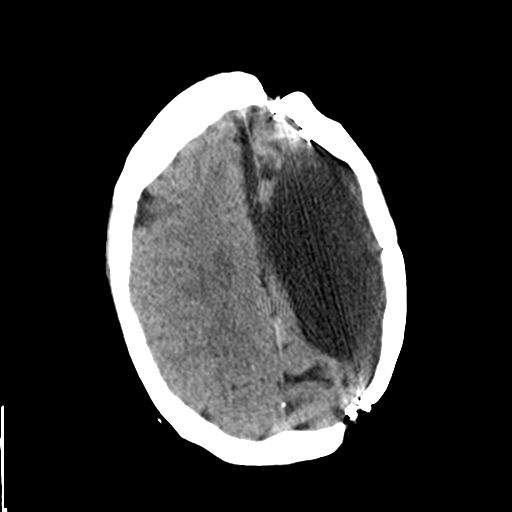
[im 22/31  brain]
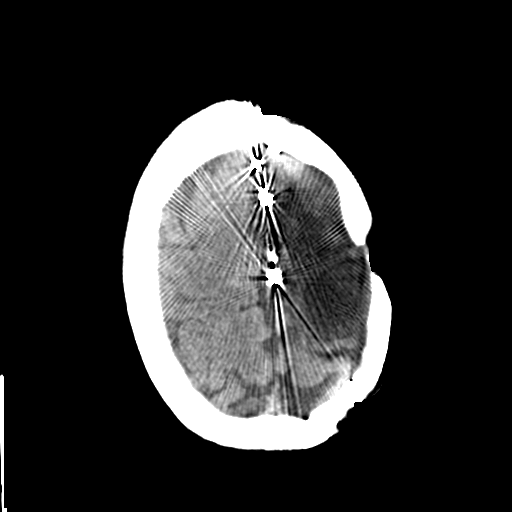
[im 25/31  brain]
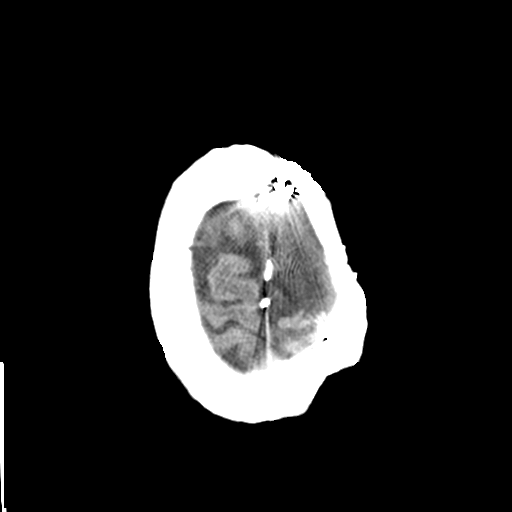
[im 28/31  brain]
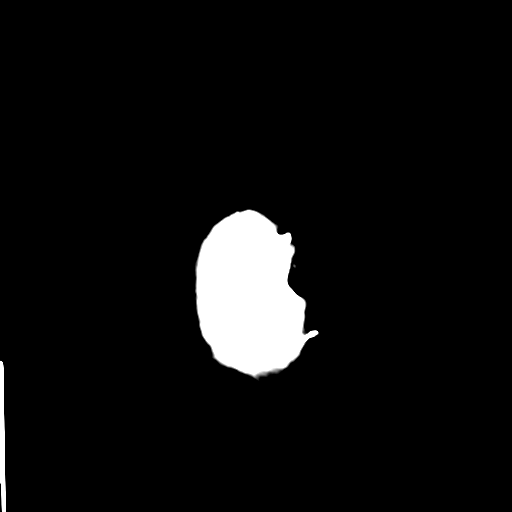
[im 28/31  bone]
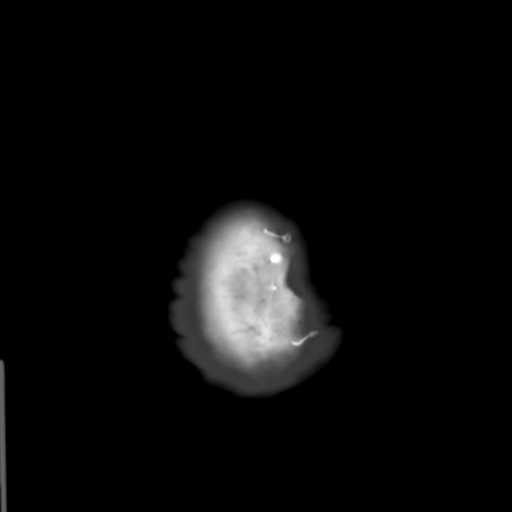

[Series 4: coronal soft tissue · coronal · 0.32mm/px · 3 of 68 slices shown]
[im 23/68  brain]
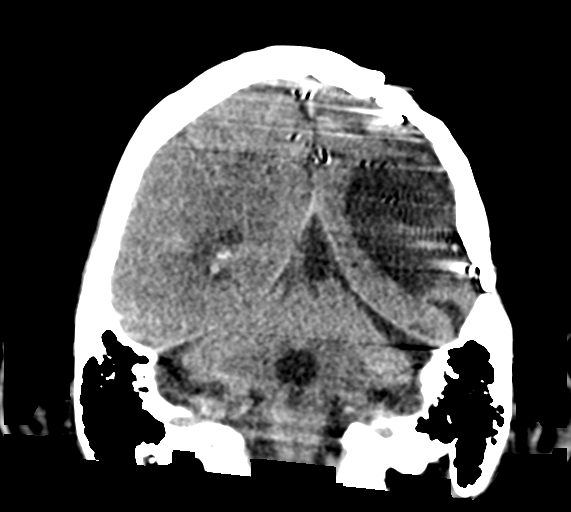
[im 30/68  brain]
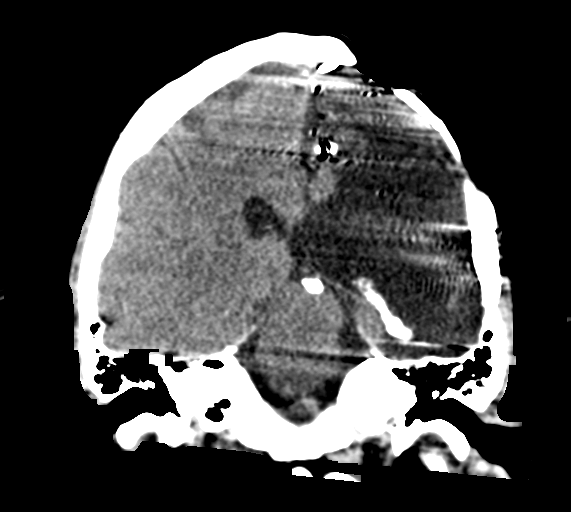
[im 38/68  brain]
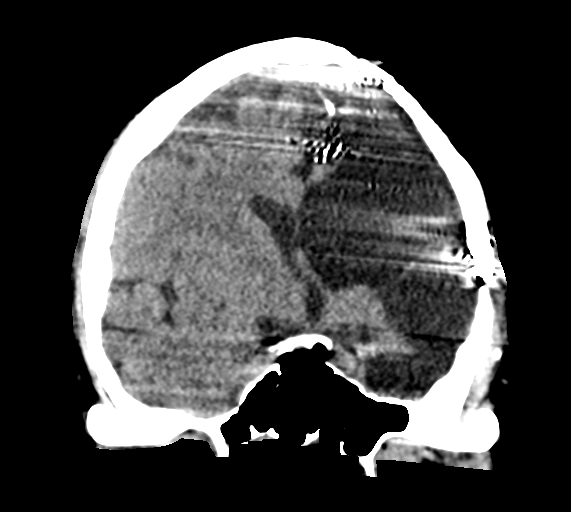

[Series 5: sagittal soft tissue · sagittal · 0.33mm/px · 3 of 53 slices shown]
[im 18/53  brain]
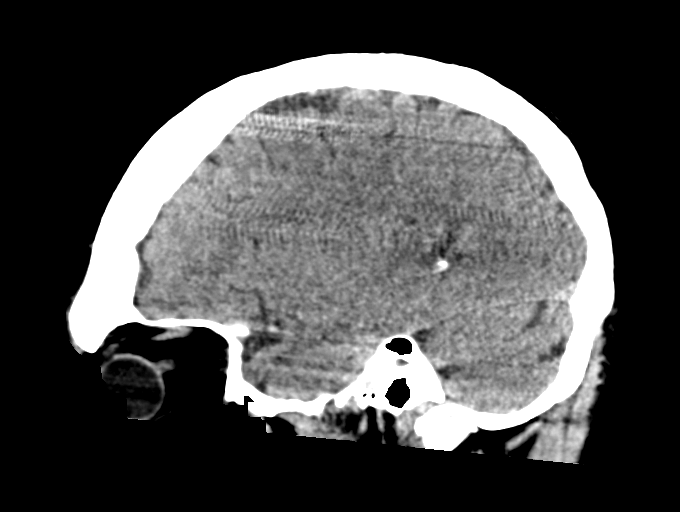
[im 27/53  brain]
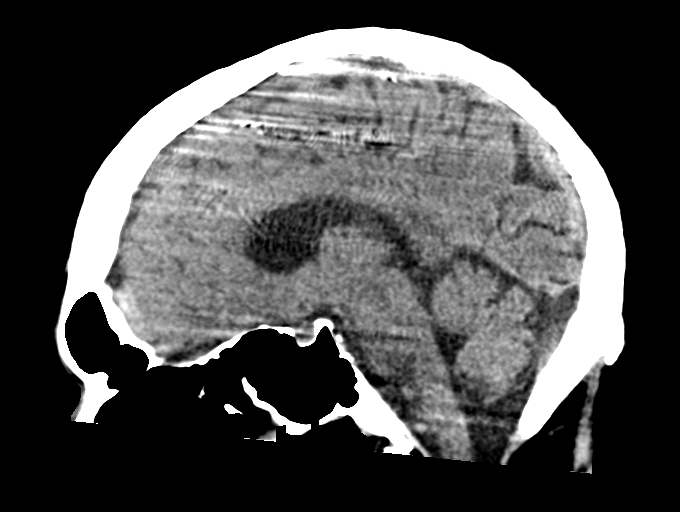
[im 35/53  brain]
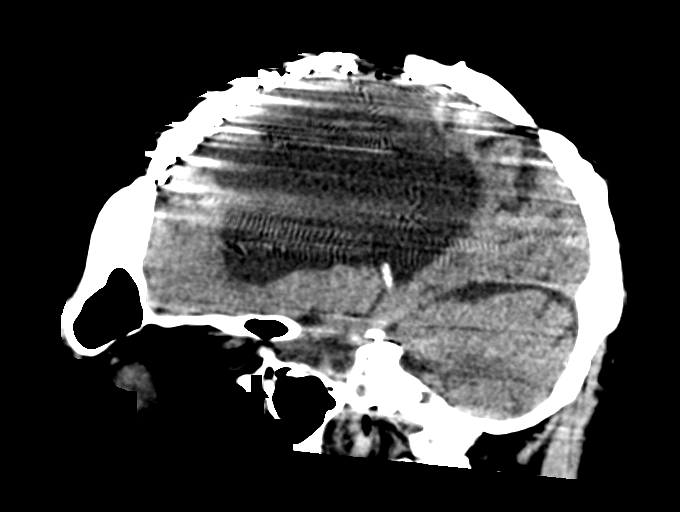

[15 of 47 positions shown; findings below may reference images not displayed]

FINDINGS: Brain: Chronic stable left hemispheric encephalomalacia and
dilatation of the left lateral ventricle compatible with
postsurgical changes. No mass, mass effect, shift of midline
structures or acute hemorrhage. No evidence of acute infarction.
Surgical sutures/clips along the interhemispheric fissure.

Vascular: No hyperdense vessel or unexpected calcification.

Skull: Multiple postsurgical changes left frontoparietal skull.

Sinuses/Orbits: No acute finding.

Other: None.
IMPRESSION: No acute findings.

Stable chronic postsurgical changes of the left hemisphere.

## 2020-04-21 ENCOUNTER — Ambulatory Visit (INDEPENDENT_AMBULATORY_CARE_PROVIDER_SITE_OTHER): Payer: Medicare Other | Admitting: Vascular Surgery

## 2020-04-21 ENCOUNTER — Other Ambulatory Visit: Payer: Self-pay

## 2020-04-21 ENCOUNTER — Ambulatory Visit (INDEPENDENT_AMBULATORY_CARE_PROVIDER_SITE_OTHER): Payer: Medicare Other

## 2020-04-21 ENCOUNTER — Encounter (INDEPENDENT_AMBULATORY_CARE_PROVIDER_SITE_OTHER): Payer: Self-pay | Admitting: Vascular Surgery

## 2020-04-21 VITALS — BP 149/81 | HR 116 | Resp 18 | Ht 72.0 in | Wt 183.0 lb

## 2020-04-21 DIAGNOSIS — I6523 Occlusion and stenosis of bilateral carotid arteries: Secondary | ICD-10-CM

## 2020-04-21 DIAGNOSIS — I10 Essential (primary) hypertension: Secondary | ICD-10-CM

## 2020-04-21 DIAGNOSIS — G8191 Hemiplegia, unspecified affecting right dominant side: Secondary | ICD-10-CM | POA: Diagnosis not present

## 2020-04-21 NOTE — Progress Notes (Signed)
MRN : [id number]  Dylan Sullivan is a 74 y.o. (1945-12-16) male who presents with chief complaint of  Chief Complaint  Patient presents with  . Follow-up    ultrasound  .  History of Present Illness: Patient returns in follow-up of his carotid disease.  He is doing well.  He has chronic right-sided weakness which has not changed.  He has no new complaints since his last visit 2 years ago.  Carotid duplex today shows mild carotid artery disease in the 1 to 39% range bilaterally which is unchanged from previous studies.  Current Outpatient Medications  Medication Sig Dispense Refill  . acidophilus (RISAQUAD) CAPS capsule Take 1 capsule by mouth 2 (two) times daily.     Marland Kitchen aspirin EC 81 MG tablet Take 81 mg by mouth daily.    . Black Pepper-Turmeric (TURMERIC COMPLEX/BLACK PEPPER) 3-500 MG CAPS Take by mouth.    . CAPSICUM, CAYENNE, PO Take 25 mg by mouth daily.     . Cholecalciferol (VITAMIN D3) 1.25 MG (50000 UT) CAPS TK ONE C PO ONCE A WEEK    . Coenzyme Q10 (COQ10) 200 MG CAPS Take by mouth.    . CRANBERRY PO Take 1 capsule by mouth daily.     . finasteride (PROSCAR) 5 MG tablet Take 1 tablet (5 mg total) by mouth daily. 30 tablet 12  . Flaxseed Oil (LINSEED OIL) OIL Take 25ml by mouth daily    . levETIRAcetam (KEPPRA) 750 MG tablet Take 1 tablet (750 mg total) by mouth 2 (two) times daily. 60 tablet 0  . Misc Natural Products (ESTROVEN + ENERGY MAX STRENGTH) TABS Take by mouth.    Marland Kitchen OVER THE COUNTER MEDICATION Take 1 capsule by mouth daily. chanca piedra     . OVER THE COUNTER MEDICATION Take 29 mg by mouth daily. Black Strap Molasses    . phenytoin (DILANTIN) 100 MG ER capsule Take 300 mg by mouth at bedtime.     . tamsulosin (FLOMAX) 0.4 MG CAPS capsule Take 1 capsule (0.4 mg total) by mouth daily. 30 capsule 12  . triamcinolone cream (KENALOG) 0.1 % Apply topically 2 (two) times daily.    . Turmeric 500 MG CAPS Take 500 mg by mouth 2 (two) times daily.     . vitamin C (ASCORBIC  ACID) 500 MG tablet Take 1,000 mg by mouth 2 (two) times daily.      No current facility-administered medications for this visit.    Past Medical History:  Diagnosis Date  . BP (high blood pressure) 04/16/2015  . BPH (benign prostatic hyperplasia)   . BPH with obstruction/lower urinary tract symptoms 04/20/2015  . Cancer ([place])    skin  . Carotid artery stenosis 11/11/2016  . Cerebral palsy ([place])   . Elevated PSA   . Hematuria   . Hemiplegia ([place]) 09/13/2011   Overview:  Upper extremity most affected   . History of elevated PSA 04/20/2015  . Hydronephrosis   . Kidney stone   . Nocturia   . Obstructive apnea 12/29/2014  . Osteoporosis   . 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  none since starting keppra  . 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 Seizures ([place])    1956-62. esolved after brain surgery  . Sleep apnea    CPAP  . Urinary retention     Past Surgical History:  Procedure Laterality Date  . APPENDECTOMY    . arm surgery Right    child  .  [place]  . CATARACT EXTRACTION W/PHACO Left 09/13/2017   Procedure: CATARACT EXTRACTION PHACO AND INTRAOCULAR LENS PLACEMENT ([place]) LEFT;  Surgeon: Leandrew Koyanagi, MD;  Location: [facility];  Service: Ophthalmology;  Laterality: Left;  sleep apnea  . CATARACT EXTRACTION W/PHACO Right 11/28/2017   Procedure: CATARACT EXTRACTION PHACO AND INTRAOCULAR LENS PLACEMENT ([facility]);  Surgeon: Leandrew Koyanagi, MD;  Location: [facility];  Service: Ophthalmology;  Laterality: Right;  [place] 00:55 AP% 9.2 CDE 5.04 Fluid pack lot # [id number]  . LEG SURGERY Right    child  . TONSILLECTOMY       Social History   Tobacco Use  . Smoking status: Never Smoker  . Smokeless tobacco: Never Used  Vaping Use  . Vaping Use: Never used  Substance Use Topics  . Alcohol use: No    Alcohol/week: 0.0 standard drinks  . Drug use: No      Family History  Problem Relation Age of Onset  . Breast  cancer Maternal Aunt   . Dementia Mother   . CAD Father   . Prostate cancer Neg Hx   . Bladder Cancer Neg Hx   . Kidney cancer Neg Hx      Allergies  Allergen Reactions  . Oxycodone-Acetaminophen Other (See Comments)    Causes hyperness     REVIEW OF SYSTEMS (Negative unless checked)  Constitutional: [] Weight loss  [] Fever  [] Chills Cardiac: [] Chest pain   [] Chest pressure   [] Palpitations   [] Shortness of breath when laying flat   [] Shortness of breath at rest   [] Shortness of breath with exertion. Vascular:  [] Pain in legs with walking   [] Pain in legs at rest   [] Pain in legs when laying flat   [] Claudication   [] Pain in feet when walking  [] Pain in feet at rest  [] Pain in feet when laying flat   [] History of DVT   [] Phlebitis   [] Swelling in legs   [] Varicose veins   [] Non-healing ulcers Pulmonary:   [] Uses home oxygen   [] Productive cough   [] Hemoptysis   [] Wheeze  [] COPD   [] Asthma Neurologic:  [] Dizziness  [] Blackouts   [x] Seizures   [] History of stroke   [] History of TIA  [] Aphasia   [] Temporary blindness   [] Dysphagia   [x] Weakness or numbness in arms   [x] Weakness or numbness in legs Musculoskeletal:  [x] Arthritis   [] Joint swelling   [] Joint pain   [] Low back pain Hematologic:  [] Easy bruising  [] Easy bleeding   [] Hypercoagulable state   [] Anemic  [] Hepatitis Gastrointestinal:  [] Blood in stool   [] Vomiting blood  [] Gastroesophageal reflux/heartburn   [] Difficulty swallowing. Genitourinary:  [] Chronic kidney disease   [] Difficult urination  [] Frequent urination  [] Burning with urination   [] Blood in urine Skin:  [] Rashes   [] Ulcers   [] Wounds Psychological:  [] History of anxiety   []  History of major depression.  Physical Examination  Vitals:   04/21/20 1331  BP: (!) 149/81  Pulse: (!) 116  Resp: 18  Weight: 183 lb (83 kg)  Height: 6' (1.829 m)   Body mass index is 24.82 kg/m. Gen:  WD/WN, NAD Head: [facility]/AT, No temporalis wasting. Ear/Nose/Throat: Hearing grossly  intact, nares w/o erythema or drainage, trachea midline Eyes: Conjunctiva clear. Sclera non-icteric Neck: Supple.  No bruit  Pulmonary:  Good air movement, equal and clear to auscultation bilaterally.  Cardiac: RRR, No JVD Vascular:  Vessel Right Left  Radial Palpable Palpable       Musculoskeletal: Right-sided hemiplegia. No edema. Neurologic:  CN 2-12 intact. Sensation grossly intact in extremities.  Right hemiplegia  Speech is fluent. Psychiatric: Judgment intact, Mood & affect appropriate for pt's clinical situation. Dermatologic: No rashes or ulcers noted.  No cellulitis or open wounds.      CBC Lab Results  Component Value Date   WBC 6.1 08/04/2017   HGB 13.1 08/04/2017   HCT 37.4 (L) 08/04/2017   MCV 98.8 08/04/2017   PLT 348 08/04/2017    BMET    Component Value Date/Time   NA 141 08/04/2017 0447   NA 136 09/09/2012 1330   K 3.8 08/04/2017 0447   K 3.6 09/09/2012 1330   CL 111 08/04/2017 0447   CL 99 09/09/2012 1330   CO2 23 08/04/2017 0447   CO2 30 09/09/2012 1330   GLUCOSE 100 (H) 08/04/2017 0447   GLUCOSE 124 (H) 09/09/2012 1330   BUN 15 08/04/2017 0447   BUN 19 (H) 09/09/2012 1330   CREATININE 0.91 08/04/2017 0447   CREATININE 1.15 09/09/2012 1330   CALCIUM 9.2 08/04/2017 0447   CALCIUM 10.2 (H) 09/09/2012 1330   GFRNONAA >60 08/04/2017 0447   GFRAA >60 08/04/2017 0447   CrCl cannot be calculated (Patient's most recent lab result is older than the maximum 21 days allowed.).  COAG No results found for: INR, PROTIME  Radiology No results found.   Assessment/Plan Carotid artery stenosis Carotid duplex today shows mild carotid artery disease in the 1 to 39% range bilaterally which is unchanged from previous studies.  Continue to follow on 2-year intervals.  No change in medical regimen.  Essential (primary) hypertension blood pressure control important in reducing the progression of atherosclerotic disease. On appropriate oral  medications.   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  Any errors from dictation are purely unintentional

## 2020-04-21 NOTE — Assessment & Plan Note (Signed)
blood pressure control important in reducing the progression of atherosclerotic disease. On appropriate oral medications.  

## 2020-04-21 NOTE — Assessment & Plan Note (Signed)
Carotid duplex today shows mild carotid artery disease in the 1 to 39% range bilaterally which is unchanged from previous studies.  Continue to follow on 2-year intervals.  No change in medical regimen.

## 2020-04-21 NOTE — Assessment & Plan Note (Signed)
chronic

## 2020-05-27 ENCOUNTER — Ambulatory Visit: Payer: Medicare Other | Admitting: Podiatry

## 2020-06-01 ENCOUNTER — Other Ambulatory Visit: Payer: Self-pay

## 2020-06-01 ENCOUNTER — Ambulatory Visit (INDEPENDENT_AMBULATORY_CARE_PROVIDER_SITE_OTHER): Payer: Medicare Other | Admitting: Podiatry

## 2020-06-01 ENCOUNTER — Ambulatory Visit: Payer: Medicare Other

## 2020-06-01 ENCOUNTER — Encounter: Payer: Self-pay | Admitting: Podiatry

## 2020-06-01 DIAGNOSIS — R2681 Unsteadiness on feet: Secondary | ICD-10-CM

## 2020-06-01 DIAGNOSIS — M2170 Unequal limb length (acquired), unspecified site: Secondary | ICD-10-CM

## 2020-06-01 DIAGNOSIS — I6523 Occlusion and stenosis of bilateral carotid arteries: Secondary | ICD-10-CM | POA: Diagnosis not present

## 2020-06-01 DIAGNOSIS — M778 Other enthesopathies, not elsewhere classified: Secondary | ICD-10-CM

## 2020-06-01 NOTE — Progress Notes (Signed)
Subjective:  Patient ID: Dylan Sullivan, male    DOB: [birth year],  MRN: [id number] HPI Chief Complaint  Patient presents with  . Leg Problem    Patient states he has cerebral palsy and right side is affected, weakness especially in the right leg, right leg is shorter than the left, worsens throughout the day, can't balance very well  . New Patient (Initial Visit)    74 y.o. male presents with the above complaint.   ROS: Denies fever chills nausea vomiting muscle aches pains calf pain back pain chest pain shortness of breath.  Past Medical History:  Diagnosis Date  . BP (high blood pressure) 04/16/2015  . BPH (benign prostatic hyperplasia)   . BPH with obstruction/lower urinary tract symptoms 04/20/2015  . Cancer ([place])    skin  . Carotid artery stenosis 11/11/2016  . Cerebral palsy ([place])   . Elevated PSA   . Hematuria   . Hemiplegia ([place]) 09/13/2011   Overview:  Upper extremity most affected   . History of elevated PSA 04/20/2015  . Hydronephrosis   . Kidney stone   . Nocturia   . Obstructive apnea 12/29/2014  . Osteoporosis   . 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  none since starting keppra  . 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 Seizures ([place])    1956-62. esolved after brain surgery  . Sleep apnea    CPAP  . Urinary retention    Past Surgical History:  Procedure Laterality Date  . APPENDECTOMY    . arm surgery Right    child  . [place]  . CATARACT EXTRACTION W/PHACO Left 09/13/2017   Procedure: CATARACT EXTRACTION PHACO AND INTRAOCULAR LENS PLACEMENT ([place]) LEFT;  Surgeon: Leandrew Koyanagi, MD;  Location: [facility];  Service: Ophthalmology;  Laterality: Left;  sleep apnea  . CATARACT EXTRACTION W/PHACO Right 11/28/2017   Procedure: CATARACT EXTRACTION PHACO AND INTRAOCULAR LENS PLACEMENT ([facility]);  Surgeon: Leandrew Koyanagi, MD;  Location: [facility];  Service: Ophthalmology;  Laterality: Right;  [place] 00:55 AP%  9.2 CDE 5.04 Fluid pack lot # [id number]  . LEG SURGERY Right    child  . TONSILLECTOMY      Current Outpatient Medications:  .  acidophilus (RISAQUAD) CAPS capsule, Take 1 capsule by mouth 2 (two) times daily. , Disp: , Rfl:  .  aspirin EC 81 MG tablet, Take 81 mg by mouth daily., Disp: , Rfl:  .  Black Pepper-Turmeric (TURMERIC COMPLEX/BLACK PEPPER) 3-500 MG CAPS, Take by mouth., Disp: , Rfl:  .  CAPSICUM, CAYENNE, PO, Take 25 mg by mouth daily. , Disp: , Rfl:  .  Cholecalciferol (VITAMIN D3) 1.25 MG (50000 UT) CAPS, TK ONE C PO ONCE A WEEK, Disp: , Rfl:  .  Coenzyme Q10 (COQ10) 200 MG CAPS, Take by mouth., Disp: , Rfl:  .  CRANBERRY PO, Take 1 capsule by mouth daily. , Disp: , Rfl:  .  finasteride (PROSCAR) 5 MG tablet, Take 1 tablet (5 mg total) by mouth daily., Disp: 30 tablet, Rfl: 12 .  Flaxseed Oil (LINSEED OIL) OIL, Take 2ml by mouth daily, Disp: , Rfl:  .  levETIRAcetam (KEPPRA) 750 MG tablet, Take 1 tablet (750 mg total) by mouth 2 (two) times daily., Disp: 60 tablet, Rfl: 0 .  Misc Natural Products (ESTROVEN + ENERGY [place] Height STRENGTH) TABS, Take by mouth., Disp: , Rfl:  .  OVER THE COUNTER MEDICATION, Take 1 capsule by mouth daily. chanca piedra , Disp: ,  Rfl:  .  OVER THE COUNTER MEDICATION, Take 29 mg by mouth daily. Black Strap Molasses, Disp: , Rfl:  .  phenytoin (DILANTIN) 100 MG ER capsule, Take 300 mg by mouth at bedtime. , Disp: , Rfl:  .  tamsulosin (FLOMAX) 0.4 MG CAPS capsule, Take 1 capsule (0.4 mg total) by mouth daily., Disp: 30 capsule, Rfl: 12 .  triamcinolone cream (KENALOG) 0.1 %, Apply topically 2 (two) times daily., Disp: , Rfl:  .  Turmeric 500 MG CAPS, Take 500 mg by mouth 2 (two) times daily. , Disp: , Rfl:  .  vitamin C (ASCORBIC ACID) 500 MG tablet, Take 1,000 mg by mouth 2 (two) times daily. , Disp: , Rfl:   Allergies  Allergen Reactions  . Oxycodone-Acetaminophen Other (See Comments)    Causes hyperness   Review of Systems Objective:  There were no  vitals filed for this visit.  General: Well developed, nourished, in no acute distress, alert and oriented x3   Dermatological: Skin is warm, dry and supple bilateral. Nails x 10 are well maintained; remaining integument appears unremarkable at this time. There are no open sores, no preulcerative lesions, no rash or signs of infection present.  Vascular: Dorsalis Pedis artery and Posterior Tibial artery pedal pulses are 2/4 bilateral with immedate capillary fill time. Pedal hair growth present. No varicosities and no lower extremity edema present bilateral.   Neruologic: Grossly intact via light touch bilateral. Vibratory intact via tuning fork bilateral. Protective threshold with Semmes Wienstein monofilament intact to all pedal sites bilateral. Patellar and Achilles deep tendon reflexes 2+ bilateral. No Babinski or clonus noted bilateral.   Musculoskeletal: No gross boney pedal deformities bilateral. No pain, crepitus, or limitation noted with foot and ankle range of motion bilateral. Muscular strength 5/5 in all groups tested bilateral. He has no inversion and eversion of the subtalar joint right foot. Just dorsiflexion and plantarflexion.  Gait: Unassisted, Nonantalgic.    Radiographs:  None taken  Assessment & Plan:   Assessment: Cerebral palsy. Short right leg. Painful leg and thigh. Fall risk. Poor balance.  Plan: Discussed etiology pathology of surgical versus conservative therapies. At this point really there is no surgical therapy. The only thing that he can have done is a buildup on his right shoe. He will follow up with Liliane Channel to discuss this.  Liliane Channel will discussed building up his right shoe.  Once he has his shoes we will consider sending him to [facility] physical therapy group for gait training and balance training.  May need to consider balance braces.     Deshauna Cayson T. [place], [place]

## 2020-06-12 ENCOUNTER — Emergency Department: Payer: Medicare Other

## 2020-06-12 ENCOUNTER — Other Ambulatory Visit: Payer: Self-pay

## 2020-06-12 ENCOUNTER — Encounter: Payer: Self-pay | Admitting: Emergency Medicine

## 2020-06-12 ENCOUNTER — Inpatient Hospital Stay
Admission: EM | Admit: 2020-06-12 | Discharge: 2020-07-14 | DRG: 871 | Disposition: E | Payer: Medicare Other | Attending: Internal Medicine | Admitting: Internal Medicine

## 2020-06-12 DIAGNOSIS — N3000 Acute cystitis without hematuria: Secondary | ICD-10-CM | POA: Diagnosis not present

## 2020-06-12 DIAGNOSIS — Z01818 Encounter for other preprocedural examination: Secondary | ICD-10-CM

## 2020-06-12 DIAGNOSIS — Z66 Do not resuscitate: Secondary | ICD-10-CM | POA: Diagnosis not present

## 2020-06-12 DIAGNOSIS — E876 Hypokalemia: Secondary | ICD-10-CM | POA: Diagnosis not present

## 2020-06-12 DIAGNOSIS — G8191 Hemiplegia, unspecified affecting right dominant side: Secondary | ICD-10-CM | POA: Diagnosis present

## 2020-06-12 DIAGNOSIS — I110 Hypertensive heart disease with heart failure: Secondary | ICD-10-CM | POA: Diagnosis present

## 2020-06-12 DIAGNOSIS — N400 Enlarged prostate without lower urinary tract symptoms: Secondary | ICD-10-CM | POA: Diagnosis present

## 2020-06-12 DIAGNOSIS — I5021 Acute systolic (congestive) heart failure: Secondary | ICD-10-CM | POA: Diagnosis present

## 2020-06-12 DIAGNOSIS — G4733 Obstructive sleep apnea (adult) (pediatric): Secondary | ICD-10-CM | POA: Diagnosis present

## 2020-06-12 DIAGNOSIS — S80211A Abrasion, right knee, initial encounter: Secondary | ICD-10-CM | POA: Diagnosis present

## 2020-06-12 DIAGNOSIS — T508X5A Adverse effect of diagnostic agents, initial encounter: Secondary | ICD-10-CM | POA: Diagnosis not present

## 2020-06-12 DIAGNOSIS — U071 COVID-19: Secondary | ICD-10-CM | POA: Diagnosis present

## 2020-06-12 DIAGNOSIS — E871 Hypo-osmolality and hyponatremia: Secondary | ICD-10-CM | POA: Diagnosis not present

## 2020-06-12 DIAGNOSIS — M810 Age-related osteoporosis without current pathological fracture: Secondary | ICD-10-CM | POA: Diagnosis present

## 2020-06-12 DIAGNOSIS — Z515 Encounter for palliative care: Secondary | ICD-10-CM

## 2020-06-12 DIAGNOSIS — G40109 Localization-related (focal) (partial) symptomatic epilepsy and epileptic syndromes with simple partial seizures, not intractable, without status epilepticus: Secondary | ICD-10-CM | POA: Diagnosis present

## 2020-06-12 DIAGNOSIS — N179 Acute kidney failure, unspecified: Secondary | ICD-10-CM | POA: Diagnosis not present

## 2020-06-12 DIAGNOSIS — Z452 Encounter for adjustment and management of vascular access device: Secondary | ICD-10-CM

## 2020-06-12 DIAGNOSIS — W1830XA Fall on same level, unspecified, initial encounter: Secondary | ICD-10-CM | POA: Diagnosis present

## 2020-06-12 DIAGNOSIS — Z0189 Encounter for other specified special examinations: Secondary | ICD-10-CM

## 2020-06-12 DIAGNOSIS — G809 Cerebral palsy, unspecified: Secondary | ICD-10-CM | POA: Diagnosis present

## 2020-06-12 DIAGNOSIS — J1282 Pneumonia due to coronavirus disease 2019: Secondary | ICD-10-CM | POA: Diagnosis present

## 2020-06-12 DIAGNOSIS — G928 Other toxic encephalopathy: Secondary | ICD-10-CM | POA: Diagnosis not present

## 2020-06-12 DIAGNOSIS — Y92009 Unspecified place in unspecified non-institutional (private) residence as the place of occurrence of the external cause: Secondary | ICD-10-CM

## 2020-06-12 DIAGNOSIS — S80212A Abrasion, left knee, initial encounter: Secondary | ICD-10-CM | POA: Diagnosis present

## 2020-06-12 DIAGNOSIS — N401 Enlarged prostate with lower urinary tract symptoms: Secondary | ICD-10-CM | POA: Diagnosis present

## 2020-06-12 DIAGNOSIS — I700 Atherosclerosis of aorta: Secondary | ICD-10-CM | POA: Diagnosis present

## 2020-06-12 DIAGNOSIS — J9600 Acute respiratory failure, unspecified whether with hypoxia or hypercapnia: Secondary | ICD-10-CM

## 2020-06-12 DIAGNOSIS — W19XXXA Unspecified fall, initial encounter: Secondary | ICD-10-CM

## 2020-06-12 DIAGNOSIS — G40909 Epilepsy, unspecified, not intractable, without status epilepticus: Secondary | ICD-10-CM

## 2020-06-12 DIAGNOSIS — N390 Urinary tract infection, site not specified: Secondary | ICD-10-CM | POA: Diagnosis present

## 2020-06-12 DIAGNOSIS — N138 Other obstructive and reflux uropathy: Secondary | ICD-10-CM | POA: Diagnosis present

## 2020-06-12 DIAGNOSIS — J9601 Acute respiratory failure with hypoxia: Secondary | ICD-10-CM

## 2020-06-12 DIAGNOSIS — A419 Sepsis, unspecified organism: Secondary | ICD-10-CM | POA: Diagnosis present

## 2020-06-12 DIAGNOSIS — I6529 Occlusion and stenosis of unspecified carotid artery: Secondary | ICD-10-CM | POA: Diagnosis present

## 2020-06-12 DIAGNOSIS — R6521 Severe sepsis with septic shock: Secondary | ICD-10-CM | POA: Diagnosis present

## 2020-06-12 DIAGNOSIS — A411 Sepsis due to other specified staphylococcus: Secondary | ICD-10-CM | POA: Diagnosis not present

## 2020-06-12 DIAGNOSIS — Z7982 Long term (current) use of aspirin: Secondary | ICD-10-CM

## 2020-06-12 DIAGNOSIS — Z8249 Family history of ischemic heart disease and other diseases of the circulatory system: Secondary | ICD-10-CM

## 2020-06-12 DIAGNOSIS — Z4659 Encounter for fitting and adjustment of other gastrointestinal appliance and device: Secondary | ICD-10-CM

## 2020-06-12 DIAGNOSIS — Z885 Allergy status to narcotic agent status: Secondary | ICD-10-CM

## 2020-06-12 DIAGNOSIS — Z79899 Other long term (current) drug therapy: Secondary | ICD-10-CM

## 2020-06-12 DIAGNOSIS — J80 Acute respiratory distress syndrome: Secondary | ICD-10-CM | POA: Diagnosis not present

## 2020-06-12 DIAGNOSIS — I10 Essential (primary) hypertension: Secondary | ICD-10-CM | POA: Diagnosis present

## 2020-06-12 DIAGNOSIS — Z803 Family history of malignant neoplasm of breast: Secondary | ICD-10-CM

## 2020-06-12 LAB — URINALYSIS, COMPLETE (UACMP) WITH MICROSCOPIC
Bilirubin Urine: NEGATIVE
Glucose, UA: NEGATIVE mg/dL
Ketones, ur: 5 mg/dL — AB
Nitrite: POSITIVE — AB
Protein, ur: 100 mg/dL — AB
Specific Gravity, Urine: 1.017 (ref 1.005–1.030)
Squamous Epithelial / HPF: NONE SEEN (ref 0–5)
WBC, UA: >50 WBC/hpf — ABNORMAL HIGH (ref 0–5)
pH: 5 (ref 5.0–8.0)

## 2020-06-12 LAB — CBC WITH DIFFERENTIAL/PLATELET
Abs Immature Granulocytes: 0.04 10*3/uL (ref 0.00–0.07)
Basophils Absolute: 0 10*3/uL (ref 0.0–0.1)
Basophils Relative: 0 %
Eosinophils Absolute: 0 10*3/uL (ref 0.0–0.5)
Eosinophils Relative: 0 %
HCT: 41.3 % (ref 39.0–52.0)
Hemoglobin: 14.2 g/dL (ref 13.0–17.0)
Immature Granulocytes: 0 %
Lymphocytes Relative: 6 %
Lymphs Abs: 0.6 10*3/uL — ABNORMAL LOW (ref 0.7–4.0)
MCH: 33.2 pg (ref 26.0–34.0)
MCHC: 34.4 g/dL (ref 30.0–36.0)
MCV: 96.5 fL (ref 80.0–100.0)
Monocytes Absolute: 0.5 10*3/uL (ref 0.1–1.0)
Monocytes Relative: 5 %
Neutro Abs: 8.8 10*3/uL — ABNORMAL HIGH (ref 1.7–7.7)
Neutrophils Relative %: 89 %
Platelets: 253 10*3/uL (ref 150–400)
RBC: 4.28 MIL/uL (ref 4.22–5.81)
RDW: 12.1 % (ref 11.5–15.5)
WBC: 10 10*3/uL (ref 4.0–10.5)
nRBC: 0 % (ref 0.0–0.2)

## 2020-06-12 LAB — LACTIC ACID, PLASMA: Lactic Acid, Venous: 1.7 mmol/L (ref 0.5–1.9)

## 2020-06-12 LAB — PROTIME-INR
INR: 1 (ref 0.8–1.2)
Prothrombin Time: 12.7 seconds (ref 11.4–15.2)

## 2020-06-12 MED ORDER — ACETAMINOPHEN 500 MG PO TABS
1000.0000 mg | ORAL_TABLET | Freq: Once | ORAL | Status: AC
  Administered 2020-06-12: 1000 mg via ORAL
  Filled 2020-06-12: qty 2

## 2020-06-12 MED ORDER — SODIUM CHLORIDE 0.9 % IV SOLN
2.0000 g | Freq: Once | INTRAVENOUS | Status: AC
  Administered 2020-06-12: 2 g via INTRAVENOUS
  Filled 2020-06-12: qty 2

## 2020-06-12 MED ORDER — LACTATED RINGERS IV BOLUS (SEPSIS)
500.0000 mL | Freq: Once | INTRAVENOUS | Status: AC
  Administered 2020-06-12: 500 mL via INTRAVENOUS

## 2020-06-12 MED ORDER — VANCOMYCIN HCL IN DEXTROSE 1-5 GM/200ML-% IV SOLN
1000.0000 mg | Freq: Once | INTRAVENOUS | Status: AC
  Administered 2020-06-13: 1000 mg via INTRAVENOUS
  Filled 2020-06-12: qty 200

## 2020-06-12 MED ORDER — LACTATED RINGERS IV BOLUS (SEPSIS)
1000.0000 mL | Freq: Once | INTRAVENOUS | Status: AC
  Administered 2020-06-12: 1000 mL via INTRAVENOUS

## 2020-06-12 MED ORDER — LACTATED RINGERS IV SOLN: INTRAVENOUS | Status: DC

## 2020-06-12 MED ORDER — VANCOMYCIN HCL IN DEXTROSE 1-5 GM/200ML-% IV SOLN
1000.0000 mg | Freq: Once | INTRAVENOUS | Status: AC
  Administered 2020-06-12: 1000 mg via INTRAVENOUS
  Filled 2020-06-12: qty 200

## 2020-06-12 MED ORDER — VANCOMYCIN HCL IN DEXTROSE 1-5 GM/200ML-% IV SOLN: 1000.0000 mg | Freq: Once | INTRAVENOUS | Status: DC

## 2020-06-12 NOTE — ED Notes (Signed)
took over pt care at 2308, pt to CT att, this RN in other room until this time, pt load prevented timely ABX admin

## 2020-06-12 NOTE — Progress Notes (Signed)
CODE SEPSIS - PHARMACY COMMUNICATION  **Broad Spectrum Antibiotics should be administered within 1 hour of Sepsis diagnosis**  Time Code Sepsis Called/Page Received: 2241  Antibiotics Ordered: Cefepime and Vancomycin  Time of 1st antibiotic administration: 2334  Otelia Sergeant, PharmD, [facility] 23-Jun-2020 11:46 PM

## 2020-06-12 NOTE — ED Provider Notes (Signed)
[facility] Emergency Department Provider Note  ____________________________________________   Event Date/Time   First MD Initiated Contact with Patient 06/08/2020 2228     (approximate)  I have reviewed the triage vital signs and the nursing notes.   HISTORY  Chief Complaint Fall and Fever    HPI Dylan Sullivan is a 74 y.o. male with past medical history of cerebral palsy, primarily affecting right side, here with weakness, falls, and decreased appetite.  Patient states that over the last several days, he has had increasing weakness.  He has chronic right-sided weakness due to cerebral palsy but his left leg has been more weak and has been giving out.  He has had decreased appetite.  He states he is felt generally weak.  He does state that he has had a mild cough but no shortness of breath.  Denies any abdominal pain.  He has noticed that he has been having difficulty with going to the bathroom more often (urinating), but denies any dysuria.  No flank pain.  No headache.  No focal numbness or weakness.  No specific alleviating factors.  His weakness seems worse with any attempted ambulating.  He has fallen twice today, and skinned his left and right knees, but denies any significant neck pain.  He does believe he hit his head.        Past Medical History:  Diagnosis Date  . BP (high blood pressure) 04/16/2015  . BPH (benign prostatic hyperplasia)   . BPH with obstruction/lower urinary tract symptoms 04/20/2015  . Cancer ([place])    skin  . Carotid artery stenosis 11/11/2016  . Cerebral palsy ([place])   . Elevated PSA   . Hematuria   . Hemiplegia ([place]) 09/13/2011   Overview:  Upper extremity most affected   . History of elevated PSA 04/20/2015  . Hydronephrosis   . Kidney stone   . Nocturia   . Obstructive apnea 12/29/2014  . Osteoporosis   . 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  none since starting keppra  . 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 Seizures ([place])    1956-62. esolved after brain surgery  . Sleep apnea    CPAP  . Urinary retention     Patient Active Problem List   Diagnosis Date Noted  . OSA on CPAP 08/13/2019  . Vertigo 08/03/2017  . Carotid artery stenosis 11/11/2016  . 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 BPH with obstruction/lower urinary tract symptoms 04/20/2015  . History of elevated PSA 04/20/2015  . Essential (primary) hypertension 04/16/2015  . OSA (obstructive sleep apnea) 12/29/2014  . Encounter for therapeutic drug level monitoring 09/25/2014  . Hemiplegia ([place]) 09/13/2011  . 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 Dyspnea and respiratory abnormalities 03/13/2008    Past Surgical History:  Procedure Laterality Date  . APPENDECTOMY    . arm surgery Right    child  . [place]  . CATARACT EXTRACTION W/PHACO Left 09/13/2017   Procedure: CATARACT EXTRACTION PHACO AND INTRAOCULAR LENS PLACEMENT ([place]) LEFT;  Surgeon: Leandrew Koyanagi, MD;  Location: [facility];  Service: Ophthalmology;  Laterality: Left;  sleep apnea  . CATARACT EXTRACTION W/PHACO Right 11/28/2017   Procedure: CATARACT EXTRACTION PHACO AND INTRAOCULAR LENS PLACEMENT ([facility]);  Surgeon: Leandrew Koyanagi, MD;  Location: [facility];  Service: Ophthalmology;  Laterality: Right;  [place] 00:55 AP% 9.2 CDE 5.04 Fluid  pack lot # [id number]  . LEG SURGERY Right    child  . TONSILLECTOMY      Prior to Admission medications   Medication Sig Start Date End Date Taking? Authorizing Provider  acidophilus (RISAQUAD) CAPS capsule Take 1 capsule by mouth 2 (two) times daily.     [provider]  aspirin EC 81 MG tablet Take 81 mg by mouth daily.    [provider]  Black Pepper-Turmeric (TURMERIC COMPLEX/BLACK PEPPER) 3-500 MG CAPS Take by mouth.    [provider]   CAPSICUM, CAYENNE, PO Take 25 mg by mouth daily.     [provider]  Cholecalciferol (VITAMIN D3) 1.25 MG (50000 UT) CAPS TK ONE C PO ONCE A WEEK 08/04/18   [provider]  Coenzyme Q10 (COQ10) 200 MG CAPS Take by mouth.    [provider]  CRANBERRY PO Take 1 capsule by mouth daily.     [provider]  finasteride (PROSCAR) 5 MG tablet Take 1 tablet (5 mg total) by mouth daily. 11/21/19   McGowan, Carollee Herter A, PA-C  Flaxseed Oil (LINSEED OIL) OIL Take 61ml by mouth daily    [provider]  levETIRAcetam (KEPPRA) 750 MG tablet Take 1 tablet (750 mg total) by mouth 2 (two) times daily. 06/18/16   Enedina Finner, MD  Misc Natural Products (ESTROVEN + ENERGY MAX STRENGTH) TABS Take by mouth.    [provider]  OVER THE COUNTER MEDICATION Take 1 capsule by mouth daily. chanca piedra     [provider]  OVER THE COUNTER MEDICATION Take 29 mg by mouth daily. Black Strap Molasses    [provider]  phenytoin (DILANTIN) 100 MG ER capsule Take 300 mg by mouth at bedtime.     [provider]  tamsulosin (FLOMAX) 0.4 MG CAPS capsule Take 1 capsule (0.4 mg total) by mouth daily. 11/21/19   Michiel Cowboy A, PA-C  triamcinolone cream (KENALOG) 0.1 % Apply topically 2 (two) times daily. 04/02/20   [provider]  Turmeric 500 MG CAPS Take 500 mg by mouth 2 (two) times daily.     [provider]  vitamin C (ASCORBIC ACID) 500 MG tablet Take 1,000 mg by mouth 2 (two) times daily.     [provider]    Allergies Oxycodone-acetaminophen  Family History  Problem Relation Age of Onset  . Breast cancer Maternal Aunt   . Dementia Mother   . CAD Father   . Prostate cancer Neg Hx   . Bladder Cancer Neg Hx   . Kidney cancer Neg Hx     Social History Social History   Tobacco Use  . Smoking status: Never Smoker  . Smokeless tobacco: Never Used  Vaping Use  . Vaping Use: Never used  Substance Use  Topics  . Alcohol use: No    Alcohol/week: 0.0 standard drinks  . Drug use: No    Review of Systems  Review of Systems  Constitutional: Positive for fatigue. Negative for chills and fever.  HENT: Negative for sore throat.   Respiratory: Negative for shortness of breath.   Cardiovascular: Negative for chest pain.  Gastrointestinal: Negative for abdominal pain.  Genitourinary: Positive for frequency. Negative for flank pain.  Musculoskeletal: Negative for neck pain.  Skin: Negative for rash and wound.  Allergic/Immunologic: Negative for immunocompromised state.  Neurological: Positive for weakness. Negative for numbness.  Hematological: Does not bruise/bleed easily.  All other systems reviewed and are negative.  ____________________________________________  PHYSICAL EXAM:      VITAL SIGNS: ED Triage Vitals  Enc Vitals Group     BP 05/16/2020 2206 125/61     Pulse Rate 06/01/2020 2206 (!) 105     Resp 05/28/2020 2206 (!) 25     Temp 06/06/2020 2206 (!) 101.3 F (38.5 C)     Temp Source 05/27/2020 2206 Oral     SpO2 06/03/2020 2206 95 %     Weight 06/02/2020 2208 180 lb (81.6 kg)     Height 06/08/2020 2208 5\' 8"  (1.727 m)     Head Circumference --      Peak Flow --      Pain Score 05/14/2020 2208 0     Pain Loc --      Pain Edu? --      Excl. in GC? --      Physical Exam Vitals and nursing note reviewed.  Constitutional:      General: He is not in acute distress.    Appearance: He is well-developed.  HENT:     Head: Normocephalic and atraumatic.     Mouth/Throat:     Mouth: Mucous membranes are dry.  Eyes:     Conjunctiva/sclera: Conjunctivae normal.  Cardiovascular:     Rate and Rhythm: Regular rhythm. Tachycardia present.     Heart sounds: Normal heart sounds. No murmur heard. No friction rub.  Pulmonary:     Effort: Pulmonary effort is normal. No respiratory distress.     Breath sounds: Normal breath sounds. No wheezing or rales.  Abdominal:     General: There is no  distension.     Palpations: Abdomen is soft.     Tenderness: There is no abdominal tenderness.  Musculoskeletal:     Cervical back: Neck supple.  Skin:    General: Skin is warm.     Capillary Refill: Capillary refill takes less than 2 seconds.     Comments: Superficial abrasions bilateral knees.  Mild surrounding erythema.  No induration.  No fluctuance..  Neurological:     Mental Status: He is alert and oriented to person, place, and time.     Motor: No abnormal muscle tone.       ____________________________________________   LABS (all labs ordered are listed, but only abnormal results are displayed)  Labs Reviewed  CBC WITH DIFFERENTIAL/PLATELET - Abnormal; Notable for the following components:      Result Value   Neutro Abs 8.8 (*)    Lymphs Abs 0.6 (*)    All other components within normal limits  URINALYSIS, COMPLETE (UACMP) WITH MICROSCOPIC - Abnormal; Notable for the following components:   Color, Urine YELLOW (*)    APPearance CLOUDY (*)    Hgb urine dipstick LARGE (*)    Ketones, ur 5 (*)    Protein, ur 100 (*)    Nitrite POSITIVE (*)    Leukocytes,Ua MODERATE (*)    WBC, UA >50 (*)    Bacteria, UA RARE (*)    All other components within normal limits  CULTURE, BLOOD (ROUTINE X 2)  CULTURE, BLOOD (ROUTINE X 2)  LACTIC ACID, PLASMA  PROTIME-INR  LACTIC ACID, PLASMA  COMPREHENSIVE METABOLIC PANEL  TROPONIN I (HIGH SENSITIVITY)    ____________________________________________  EKG: Sinus tachycardia, significant baseline artifact so repeat ordered ________________________________________  RADIOLOGY All imaging, including plain films, CT scans, and ultrasounds, independently reviewed by me, and interpretations confirmed via formal radiology reads.  ED MD interpretation:   CXR: Interstitial prominence, no acute  PNA CT Head: [place]  Official radiology report(s): CT Head Wo Contrast  Result Date: 06/11/2020 CLINICAL DATA:  Fall, head injury EXAM: CT  HEAD WITHOUT CONTRAST TECHNIQUE: Contiguous axial images were obtained from the base of the skull through the vertex without intravenous contrast. 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 Left temporal occipital and frontotemporal craniotomies have been performed. Multiple surgical clips are seen in the region of the superior sagittal sinus. Cystic encephalomalacia appears to communicate with the left lateral ventricle that this likely results from imperceptibility of the dividing membrane secondary to severe encephalomalacia. These findings are all unchanged from prior examination. No evidence of acute intracranial hemorrhage or infarct. No abnormal mass effect or midline shift. Ventricular size involving the right lateral ventricle, third ventricle, and fourth ventricle is normal. Cerebellum is unremarkable. Vascular: No asymmetric hyperdense vasculature at the skull base. Skull: No acute calvarial fracture. Sinuses/Orbits: The paranasal sinuses are clear. The orbits are unremarkable. Other: The mastoid air cells and middle ear cavities are clear. Small left parietal scalp hematoma is seen at the vertex. IMPRESSION: Small left parietal scalp hematoma. No evidence of acute intracranial hemorrhage or infarct. Stable extensive encephalomalacia involving the left cerebral hemisphere possibly the sequela of remote MCA territory infarction. Postsurgical changes again noted with left calvarium. Electronically Signed   By: Fidela Salisbury MD   On: 05/19/2020 23:35   DG Chest Port 1 View  Result Date: 05/16/2020 CLINICAL DATA:  Multiple falls, weakness EXAM: PORTABLE CHEST 1 VIEW COMPARISON:  None. FINDINGS: Single frontal view of the chest demonstrates an unremarkable cardiac silhouette. Interstitial prominence throughout the lungs is age indeterminate, favor  multifocal areas of scarring. There is no airspace disease, effusion, or pneumothorax. There are no acute displaced fractures. IMPRESSION: 1. Diffuse interstitial prominence most compatible with scarring. No acute airspace disease. 2. No acute fracture. Electronically Signed   By: Randa Ngo M.D.   On: 05/28/2020 22:32    ____________________________________________  PROCEDURES   Procedure(s) performed (including Critical Care):  .Critical Care Performed by: Duffy Bruce, MD Authorized by: Duffy Bruce, MD   Critical care provider statement:    Critical care time (minutes):  35   Critical care time was exclusive of:  Separately billable procedures and treating other patients and teaching time   Critical care was necessary to treat or prevent imminent or life-threatening deterioration of the following conditions:  Cardiac failure, circulatory failure, sepsis and dehydration   Critical care was time spent personally by me on the following activities:  Development of treatment plan with patient or surrogate, discussions with consultants, evaluation of patient's response to treatment, examination of patient, obtaining history from patient or surrogate, ordering and performing treatments and interventions, ordering and review of laboratory studies, ordering and review of radiographic studies, pulse oximetry, re-evaluation of patient's condition and review of old charts   I assumed direction of critical care for this patient from another provider in my specialty: no   .1-3 Lead EKG Interpretation Performed by: Duffy Bruce, MD Authorized by: Duffy Bruce, MD     Interpretation: non-specific     ECG rate:  90-110   ECG rate assessment: tachycardic     Rhythm: sinus tachycardia     Ectopy: none     Conduction: normal   Comments:     Indication: sepsis    ____________________________________________  INITIAL IMPRESSION / MDM / ASSESSMENT AND PLAN /  ED COURSE  As part of  my medical decision making, I reviewed the following data within the [place] notes reviewed and incorporated, Old chart reviewed, Notes from prior ED visits, and [place] Controlled Substance Database       *Jauan Domann was evaluated in Emergency Department on 06/13/2020 for the symptoms described in the history of present illness. He was evaluated in the context of the global COVID-19 pandemic, which necessitated consideration that the patient might be at risk for infection with the SARS-CoV-2 virus that causes COVID-19. Institutional protocols and algorithms that pertain to the evaluation of patients at risk for COVID-19 are in a state of rapid change based on information released by regulatory bodies including the CDC and federal and state organizations. These policies and algorithms were followed during the patient's care in the ED.  Some ED evaluations and interventions may be delayed as a result of limited staffing during the pandemic.*     Medical Decision Making: 74 year old male here with generalized weakness and falls.  Clinically, the patient is febrile, dehydrated, and I suspect this is related to sepsis.  Primary source is likely urine given foul-smelling urine and incontinence.  UA reviewed, does show significant pyuria and positive nitrites.  White count 10.0.  Lactic acid 1.7 without evidence of severe sepsis.  His CMP is pending.  Chest x-ray shows mild interstitial prominence but no focal pneumonia.  CT head ordered secondary to his fall, will plan for admission for sepsis.  EKG with significant baseline wander, so I've asked RN to repeat. No chest pain currently.  ____________________________________________  FINAL CLINICAL IMPRESSION(S) / ED DIAGNOSES  Final diagnoses:  Sepsis without acute organ dysfunction, due to unspecified organism ([place])  Acute cystitis without hematuria     MEDICATIONS GIVEN DURING THIS VISIT:  Medications  lactated ringers  infusion (has no administration in time range)  vancomycin (VANCOCIN) IVPB 1000 mg/200 mL premix (0 mg Intravenous Stopped 06/13/20 0042)    Followed by  vancomycin (VANCOCIN) IVPB 1000 mg/200 mL premix (1,000 mg Intravenous New Bag/Given 06/13/20 0050)  lactated ringers bolus 1,000 mL (1,000 mLs Intravenous New Bag/Given 05/23/2020 2344)    And  lactated ringers bolus 1,000 mL (1,000 mLs Intravenous New Bag/Given 06/05/2020 2343)    And  lactated ringers bolus 500 mL (0 mLs Intravenous Stopped 06/13/20 0049)  ceFEPIme (MAXIPIME) 2 g in sodium chloride 0.9 % 100 mL IVPB (0 g Intravenous Stopped 05/24/2020 2340)  acetaminophen (TYLENOL) tablet 1,000 mg (1,000 mg Oral Given 05/22/2020 2345)     ED Discharge Orders    None       Note:  This document was prepared using Dragon voice recognition software and may include unintentional dictation errors.   Duffy Bruce, MD 06/13/20 [phone]

## 2020-06-12 NOTE — Progress Notes (Signed)
PHARMACY -  BRIEF ANTIBIOTIC NOTE   Pharmacy has received consult(s) for Cefepime and Vanc for unknown source from an ED provider.  The patient's profile has been reviewed for ht/wt/allergies/indication/available labs.    One time order(s) placed for Cefepime 2 gm and Vancomycin 2 gms.  Further antibiotics/pharmacy consults should be ordered by admitting physician if indicated.                       Otelia Sergeant, PharmD, [facility] Jun 23, 2020 10:58 PM

## 2020-06-12 NOTE — ED Triage Notes (Signed)
Patient [facility] Emergency [facility] from homewith complaints of 2 falls today and "increased weakness from my Cerebral Palsy that's on my right side - I was born with it"  Hx of brain surgery ands pt reports taking dilantin  Pt has urine in front of jeans - recent incontinence

## 2020-06-12 NOTE — ED Notes (Signed)
Patient transported to CT 

## 2020-06-13 DIAGNOSIS — E871 Hypo-osmolality and hyponatremia: Secondary | ICD-10-CM | POA: Diagnosis not present

## 2020-06-13 DIAGNOSIS — I5021 Acute systolic (congestive) heart failure: Secondary | ICD-10-CM | POA: Diagnosis present

## 2020-06-13 DIAGNOSIS — J9601 Acute respiratory failure with hypoxia: Secondary | ICD-10-CM | POA: Diagnosis not present

## 2020-06-13 DIAGNOSIS — I10 Essential (primary) hypertension: Secondary | ICD-10-CM

## 2020-06-13 DIAGNOSIS — G4733 Obstructive sleep apnea (adult) (pediatric): Secondary | ICD-10-CM

## 2020-06-13 DIAGNOSIS — A411 Sepsis due to other specified staphylococcus: Secondary | ICD-10-CM | POA: Diagnosis present

## 2020-06-13 DIAGNOSIS — W1830XA Fall on same level, unspecified, initial encounter: Secondary | ICD-10-CM | POA: Diagnosis present

## 2020-06-13 DIAGNOSIS — I6529 Occlusion and stenosis of unspecified carotid artery: Secondary | ICD-10-CM | POA: Diagnosis present

## 2020-06-13 DIAGNOSIS — N390 Urinary tract infection, site not specified: Secondary | ICD-10-CM | POA: Diagnosis present

## 2020-06-13 DIAGNOSIS — W19XXXA Unspecified fall, initial encounter: Secondary | ICD-10-CM

## 2020-06-13 DIAGNOSIS — G809 Cerebral palsy, unspecified: Secondary | ICD-10-CM | POA: Diagnosis present

## 2020-06-13 DIAGNOSIS — N401 Enlarged prostate with lower urinary tract symptoms: Secondary | ICD-10-CM | POA: Diagnosis present

## 2020-06-13 DIAGNOSIS — Z66 Do not resuscitate: Secondary | ICD-10-CM | POA: Diagnosis not present

## 2020-06-13 DIAGNOSIS — A419 Sepsis, unspecified organism: Secondary | ICD-10-CM | POA: Diagnosis not present

## 2020-06-13 DIAGNOSIS — J1282 Pneumonia due to coronavirus disease 2019: Secondary | ICD-10-CM | POA: Diagnosis present

## 2020-06-13 DIAGNOSIS — N3000 Acute cystitis without hematuria: Secondary | ICD-10-CM | POA: Diagnosis present

## 2020-06-13 DIAGNOSIS — N400 Enlarged prostate without lower urinary tract symptoms: Secondary | ICD-10-CM | POA: Diagnosis not present

## 2020-06-13 DIAGNOSIS — G40909 Epilepsy, unspecified, not intractable, without status epilepticus: Secondary | ICD-10-CM

## 2020-06-13 DIAGNOSIS — Z9989 Dependence on other enabling machines and devices: Secondary | ICD-10-CM

## 2020-06-13 DIAGNOSIS — G928 Other toxic encephalopathy: Secondary | ICD-10-CM | POA: Diagnosis not present

## 2020-06-13 DIAGNOSIS — Y92009 Unspecified place in unspecified non-institutional (private) residence as the place of occurrence of the external cause: Secondary | ICD-10-CM

## 2020-06-13 DIAGNOSIS — I110 Hypertensive heart disease with heart failure: Secondary | ICD-10-CM | POA: Diagnosis present

## 2020-06-13 DIAGNOSIS — G40109 Localization-related (focal) (partial) symptomatic epilepsy and epileptic syndromes with simple partial seizures, not intractable, without status epilepticus: Secondary | ICD-10-CM | POA: Diagnosis present

## 2020-06-13 DIAGNOSIS — J80 Acute respiratory distress syndrome: Secondary | ICD-10-CM | POA: Diagnosis not present

## 2020-06-13 DIAGNOSIS — G8191 Hemiplegia, unspecified affecting right dominant side: Secondary | ICD-10-CM | POA: Diagnosis present

## 2020-06-13 DIAGNOSIS — S80212A Abrasion, left knee, initial encounter: Secondary | ICD-10-CM | POA: Diagnosis present

## 2020-06-13 DIAGNOSIS — M810 Age-related osteoporosis without current pathological fracture: Secondary | ICD-10-CM | POA: Diagnosis present

## 2020-06-13 DIAGNOSIS — Z7189 Other specified counseling: Secondary | ICD-10-CM | POA: Diagnosis not present

## 2020-06-13 DIAGNOSIS — R6521 Severe sepsis with septic shock: Secondary | ICD-10-CM | POA: Diagnosis present

## 2020-06-13 DIAGNOSIS — Z885 Allergy status to narcotic agent status: Secondary | ICD-10-CM | POA: Diagnosis not present

## 2020-06-13 DIAGNOSIS — N179 Acute kidney failure, unspecified: Secondary | ICD-10-CM | POA: Diagnosis not present

## 2020-06-13 DIAGNOSIS — N138 Other obstructive and reflux uropathy: Secondary | ICD-10-CM | POA: Diagnosis present

## 2020-06-13 DIAGNOSIS — U071 COVID-19: Secondary | ICD-10-CM | POA: Diagnosis present

## 2020-06-13 DIAGNOSIS — Z515 Encounter for palliative care: Secondary | ICD-10-CM | POA: Diagnosis not present

## 2020-06-13 DIAGNOSIS — S80211A Abrasion, right knee, initial encounter: Secondary | ICD-10-CM | POA: Diagnosis present

## 2020-06-13 LAB — CBC WITH DIFFERENTIAL/PLATELET
Abs Immature Granulocytes: 0.02 10*3/uL (ref 0.00–0.07)
Basophils Absolute: 0 10*3/uL (ref 0.0–0.1)
Basophils Relative: 0 %
Eosinophils Absolute: 0 10*3/uL (ref 0.0–0.5)
Eosinophils Relative: 0 %
HCT: 36.5 % — ABNORMAL LOW (ref 39.0–52.0)
Hemoglobin: 12.6 g/dL — ABNORMAL LOW (ref 13.0–17.0)
Immature Granulocytes: 0 %
Lymphocytes Relative: 24 %
Lymphs Abs: 1.4 10*3/uL (ref 0.7–4.0)
MCH: 33.2 pg (ref 26.0–34.0)
MCHC: 34.5 g/dL (ref 30.0–36.0)
MCV: 96.3 fL (ref 80.0–100.0)
Monocytes Absolute: 0.5 10*3/uL (ref 0.1–1.0)
Monocytes Relative: 8 %
Neutro Abs: 3.9 10*3/uL (ref 1.7–7.7)
Neutrophils Relative %: 68 %
Platelets: 192 10*3/uL (ref 150–400)
RBC: 3.79 MIL/uL — ABNORMAL LOW (ref 4.22–5.81)
RDW: 12.4 % (ref 11.5–15.5)
WBC: 5.8 10*3/uL (ref 4.0–10.5)
nRBC: 0 % (ref 0.0–0.2)

## 2020-06-13 LAB — COMPREHENSIVE METABOLIC PANEL
ALT: 62 U/L — ABNORMAL HIGH (ref 0–44)
AST: 194 U/L — ABNORMAL HIGH (ref 15–41)
Albumin: 2.4 g/dL — ABNORMAL LOW (ref 3.5–5.0)
Alkaline Phosphatase: 40 U/L (ref 38–126)
Anion gap: 9 (ref 5–15)
BUN: 16 mg/dL (ref 8–23)
CO2: 23 mmol/L (ref 22–32)
Calcium: 8.4 mg/dL — ABNORMAL LOW (ref 8.9–10.3)
Chloride: 101 mmol/L (ref 98–111)
Creatinine, Ser: 0.96 mg/dL (ref 0.61–1.24)
GFR, Estimated: >60 mL/min (ref >60)
Glucose, Bld: 98 mg/dL (ref 70–99)
Potassium: 3.2 mmol/L — ABNORMAL LOW (ref 3.5–5.1)
Sodium: 133 mmol/L — ABNORMAL LOW (ref 135–145)
Total Bilirubin: 0.6 mg/dL (ref 0.3–1.2)
Total Protein: 5.8 g/dL — ABNORMAL LOW (ref 6.5–8.1)

## 2020-06-13 LAB — POC SARS CORONAVIRUS 2 AG -  ED: SARS Coronavirus 2 Ag: POSITIVE — AB

## 2020-06-13 LAB — TROPONIN I (HIGH SENSITIVITY): Troponin I (High Sensitivity): 75 ng/L — ABNORMAL HIGH (ref <18)

## 2020-06-13 LAB — RESP PANEL BY RT-PCR (FLU A&B, COVID) ARPGX2
Influenza A by PCR: NEGATIVE
Influenza B by PCR: NEGATIVE
SARS Coronavirus 2 by RT PCR: POSITIVE — AB

## 2020-06-13 LAB — CORTISOL-AM, BLOOD: Cortisol - AM: 19.4 ug/dL (ref 6.7–22.6)

## 2020-06-13 LAB — PROCALCITONIN: Procalcitonin: 14.51 ng/mL

## 2020-06-13 LAB — MAGNESIUM: Magnesium: 1.7 mg/dL (ref 1.7–2.4)

## 2020-06-13 MED ORDER — ACETAMINOPHEN 650 MG RE SUPP
650.0000 mg | Freq: Four times a day (QID) | RECTAL | Status: DC | PRN
  Administered 2020-06-16: 650 mg via RECTAL
  Filled 2020-06-13: qty 1

## 2020-06-13 MED ORDER — ACETAMINOPHEN 325 MG PO TABS
650.0000 mg | ORAL_TABLET | Freq: Four times a day (QID) | ORAL | Status: DC | PRN
  Administered 2020-06-13 – 2020-06-15 (×5): 650 mg via ORAL
  Filled 2020-06-13 (×5): qty 2

## 2020-06-13 MED ORDER — FINASTERIDE 5 MG PO TABS
5.0000 mg | ORAL_TABLET | Freq: Every day | ORAL | Status: DC
  Administered 2020-06-13 – 2020-06-15 (×3): 5 mg via ORAL
  Filled 2020-06-13 (×4): qty 1

## 2020-06-13 MED ORDER — LACTATED RINGERS IV SOLN: INTRAVENOUS | Status: AC

## 2020-06-13 MED ORDER — APIXABAN 5 MG PO TABS: 5.0000 mg | ORAL_TABLET | Freq: Two times a day (BID) | ORAL | Status: DC

## 2020-06-13 MED ORDER — ENOXAPARIN SODIUM 40 MG/0.4ML ~~LOC~~ SOLN
40.0000 mg | SUBCUTANEOUS | Status: DC
  Administered 2020-06-13 – 2020-06-14 (×2): 40 mg via SUBCUTANEOUS
  Filled 2020-06-13 (×2): qty 0.4

## 2020-06-13 MED ORDER — LEVETIRACETAM 750 MG PO TABS
750.0000 mg | ORAL_TABLET | Freq: Two times a day (BID) | ORAL | Status: DC
  Administered 2020-06-13 – 2020-06-15 (×6): 750 mg via ORAL
  Filled 2020-06-13 (×9): qty 1

## 2020-06-13 MED ORDER — TAMSULOSIN HCL 0.4 MG PO CAPS
0.4000 mg | ORAL_CAPSULE | Freq: Every day | ORAL | Status: DC
  Administered 2020-06-13 – 2020-06-15 (×3): 0.4 mg via ORAL
  Filled 2020-06-13 (×4): qty 1

## 2020-06-13 MED ORDER — POLYETHYLENE GLYCOL 3350 17 G PO PACK: 17.0000 g | PACK | Freq: Every day | ORAL | Status: DC | PRN

## 2020-06-13 MED ORDER — ACETAMINOPHEN 325 MG PO TABS: 650.0000 mg | ORAL_TABLET | Freq: Once | ORAL | Status: DC

## 2020-06-13 MED ORDER — PHENYTOIN SODIUM EXTENDED 100 MG PO CAPS
300.0000 mg | ORAL_CAPSULE | Freq: Every day | ORAL | Status: DC
  Administered 2020-06-13 – 2020-06-15 (×3): 300 mg via ORAL
  Filled 2020-06-13 (×5): qty 3

## 2020-06-13 MED ORDER — ONDANSETRON HCL 4 MG PO TABS: 4.0000 mg | ORAL_TABLET | Freq: Four times a day (QID) | ORAL | Status: DC | PRN

## 2020-06-13 MED ORDER — ONDANSETRON HCL 4 MG/2ML IJ SOLN: 4.0000 mg | Freq: Four times a day (QID) | INTRAMUSCULAR | Status: DC | PRN

## 2020-06-13 MED ORDER — CEFTRIAXONE SODIUM 1 G IJ SOLR
1.0000 g | INTRAMUSCULAR | Status: DC
  Administered 2020-06-13 – 2020-06-15 (×3): 1 g via INTRAVENOUS
  Filled 2020-06-13: qty 10
  Filled 2020-06-13 (×2): qty 1

## 2020-06-13 NOTE — Progress Notes (Signed)
Code Sepsis initiated at 2240 PM. Elink following.

## 2020-06-13 NOTE — Evaluation (Signed)
Occupational Therapy Evaluation Patient Details Name: Dylan Sullivan MRN: [id number] DOB: 1946-03-29 Today's Date: 06/13/2020    History of Present Illness Male with past medical history of cerebral palsy with right-sided hemiplegia, remote childhood history of brain surgery (now with resultant left sided extensive encephalomalacia), seizure disorder benign prostatic hyperplasia who presents to [facility] due to increasing frequency of falls and weakness. Found to be COVID + 06/14/19   Clinical Impression   Mr Bekker was seen for OT evaluation this date. Prior to hospital admission, pt was Independent for mobility and ADLs including driving. Pt lives alone in 1 level home c 2 STE. Pt presents to acute OT demonstrating impaired ADL performance and functional mobility 2/2 decreased activity tolerance, functional strength/balance/ROM deficits, and poor insight into deficits.   Pt currently requires SETUP self-feeding at bed level. MOD A for bed mobility. MIN A for UBD seated EOB. MIN A + RW for ADL t/f - desat 84% on 2L [place], resolved c seated rest to 92%. Pt denies feeling SOB despite increased WOB and wheezing in standing. Pt would benefit from skilled OT to address noted impairments and functional limitations (see below for any additional details) in order to maximize safety and independence while minimizing falls risk and caregiver burden. Upon hospital discharge, recommend STR to maximize pt safety and return to PLOF.     Follow Up Recommendations  SNF    Equipment Recommendations  Other (comment) (TBD)    Recommendations for Other Services       Precautions / Restrictions Precautions Precautions: Fall Restrictions Weight Bearing Restrictions: No      Mobility Bed Mobility Overal bed mobility: Needs Assistance Bed Mobility: Supine to Sit;Sit to Supine     Supine to sit: Mod assist Sit to supine: Mod assist        Transfers Overall transfer level: Needs  assistance Equipment used: Rolling walker (2 wheeled) Transfers: Sit to/from Stand Sit to Stand: Min assist;From elevated surface         General transfer comment: assist for lift off and  eccentric control    Balance Overall balance assessment: Needs assistance Sitting-balance support: No upper extremity supported;Feet supported Sitting balance-Leahy Scale: Fair     Standing balance support: Single extremity supported Standing balance-Leahy Scale: Fair                             ADL either performed or assessed with clinical judgement   ADL Overall ADL's : Needs assistance/impaired                                       General ADL Comments: SETUP self-feeding at bed level. MIN A + RW for ADL t/f - desat 84% on 2L [place], resolved c seated rest to 92%.                  Pertinent Vitals/Pain Pain Assessment: Faces Faces Pain Scale: Hurts a little bit Pain Location: BLE Pain Descriptors / Indicators: Discomfort;Dull Pain Intervention(s): Limited activity within patient's tolerance     Hand Dominance Left   Extremity/Trunk Assessment Upper Extremity Assessment Upper Extremity Assessment: RUE deficits/detail;LUE deficits/detail RUE Deficits / Details: hx of hemiplegia - limited grip/elbow extension. Increased tone LUE Deficits / Details: WFL grossly           Communication Communication Communication: No difficulties  Cognition Arousal/Alertness: Awake/alert Behavior During Therapy: WFL for tasks assessed/performed Overall Cognitive Status: Within Functional Limits for tasks assessed                                     General Comments  SpO2 92% on 4L [place] at rest, desat 84% standing    Exercises Exercises: Other exercises Other Exercises Other Exercises: Pt educated re: OT role, DME recs, d/c recs, falls prevnetion, ECS Other Exercises: LBD, sup<>sit, sit<>Stand, sitting/standing balance/tolerance   Shoulder  Instructions      Home Living Family/patient expects to be discharged to:: Private residence Living Arrangements: Alone Available Help at Discharge: Neighbor;Available PRN/intermittently Type of Home: House Home Access: Stairs to enter [employer] of Steps: 2 Entrance Stairs-Rails: Can reach both Home Layout: One level     Bathroom Shower/Tub: Tub/shower unit             Additional Comments: reports his church plans to put a ramp in      Prior Functioning/Environment Level of Independence: Independent        Comments: Pt reports no AD device for mobility/ADLs and drives        OT Problem List: Decreased strength;Decreased activity tolerance;Impaired balance (sitting and/or standing);Decreased safety awareness;Decreased knowledge of use of DME or AE;Cardiopulmonary status limiting activity      OT Treatment/Interventions: Self-care/ADL training;Therapeutic exercise;Energy conservation;DME and/or AE instruction;Therapeutic activities;Balance training;Patient/family education    OT Goals(Current goals can be found in the care plan section) Acute Rehab OT Goals Patient Stated Goal: To return home OT Goal Formulation: With patient Time For Goal Achievement: 06/27/20 Potential to Achieve Goals: Fair ADL Goals Pt Will Perform Grooming: with supervision;standing (c LRAD PRN) Pt Will Perform Lower Body Dressing: with modified independence;sit to/from stand (c LRAD PRN) Pt Will Transfer to Toilet: stand pivot transfer;bedside commode;with supervision (c LRAD PRN)  OT Frequency: Min 1X/week   Barriers to D/C: Inaccessible home environment;Decreased caregiver support             AM-PAC OT "6 Clicks" Daily Activity     Outcome Measure Help from another person eating meals?: A Little Help from another person taking care of personal grooming?: A Little Help from another person toileting, which includes using toliet, bedpan, or urinal?: A Lot Help from another  person bathing (including washing, rinsing, drying)?: A Lot Help from another person to put on and taking off regular upper body clothing?: A Little Help from another person to put on and taking off regular lower body clothing?: A Lot 6 Click Score: 15   End of Session Equipment Utilized During Treatment: Oxygen;Rolling walker  Activity Tolerance: Patient limited by fatigue Patient left: in bed;with call bell/phone within reach;with bed alarm set  OT Visit Diagnosis: Other abnormalities of gait and mobility (R26.89)                Time: [id number] OT Time Calculation (min): 24 min Charges:  OT General Charges $OT Visit: 1 Visit OT Evaluation $OT Eval Low Complexity: 1 Low OT Treatments $Self Care/Home Management : 8-22 mins  Dessie Coma, M.S. OTR/L  06/13/20, 4:45 PM  ascom [phone]

## 2020-06-13 NOTE — ED Notes (Signed)
Lab notified to add on Urine Culture. 

## 2020-06-13 NOTE — Progress Notes (Signed)
   Patient seen and examined at bedside, patient admitted after midnight, please see earlier detailed admission note by Marinda Elk, MD. Briefly, patient presented secondary to fever and fall and found to have evidence of sepsis with likely urinary source. Code sepsis initiated on admission and empiric Ceftriaxone IV started.  Subjective: No concerns overnight. Feels alright. Afebrile since initial presentation.  BP 132/66   Pulse 85   Temp 99.7 F (37.6 C) (Oral)   Resp (!) 22   Ht 5\' 8"  (1.727 m)   Wt 81.6 kg   SpO2 99%   BMI 27.37 kg/m   General exam: Appears calm and comfortable Respiratory system: Clear to auscultation. Respiratory effort normal. Cardiovascular system: S1 & S2 heard, RRR. No murmurs, rubs, gallops or clicks. Gastrointestinal system: Abdomen is distended, soft and nontender. No organomegaly or masses felt. Normal bowel sounds heard. Central nervous system: Alert and oriented. Right hemiplegia Musculoskeletal: No edema. No calf tenderness Skin: No cyanosis. No rashes Psychiatry: Judgement and insight appear normal. Mood & affect appropriate.   Brief assessment/Plan:  Sepsis Present on admission. Assumed urinary source. No significant urinary symptoms per patient, however urinalysis suggests possible infection. Blood cultures obtained on admission. Patient was not aware of BPH diagnosis but appears to be treated for it. -Add-on urine culture; request add on to initial urine sample -Follow up blood cultures -Continue Ceftriaxone IV -PT/OT eval   Family communication: None at bedside. Patient lives alone. DVT prophylaxis: Lovenox Disposition: Anticipate discharge readiness in 2-3 days. Patient lives at home alone and ambulates with a walker. PT/OT eval ordered  , MD [facility] 06/13/2020, 8:30 AM

## 2020-06-13 NOTE — Progress Notes (Signed)
Code Sepsis completed 

## 2020-06-13 NOTE — ED Notes (Signed)
Hospitalist to bedside.

## 2020-06-13 NOTE — H&P (Signed)
History and Physical    Dylan Sullivan [id number] DOB: [birth year] DOA: 05/18/2020  PCP: Casilda Carls, MD  Patient coming from: Home   Chief Complaint:  Chief Complaint  Patient presents with  . Fall  . Fever     HPI:    Male with past medical history of cerebral palsy with right-sided hemiplegia, remote childhood history of brain surgery (now with resultant left sided extensive encephalomalacia), seizure disorder benign prostatic hyperplasia who presents to [facility] due to increasing frequency of falls and weakness.  Patient explains that over the past several days he has been experiencing subjective fevers.  Over the same span of time patient has experienced poor appetite and progressively worsening weakness.  Patient denies any abdominal pain, nausea, vomiting, diarrhea, cough, shortness of breath, sick contacts, recent travel or confirmed contact with COVID-19.  Patient symptoms continue to worsen in the morning of 12/31 the patient noticed that his legs were much weaker than his baseline.  This resulted in 2 falls while at home without any preceding loss of consciousness or head trauma.  EMS was then contacted and the patient was promptly brought into [facility] emergency department for evaluation.  Upon evaluation in the emergency room, patient was found to have multiple SIRS criteria including fever, tachycardia and tachypnea.  Patient was found to have dark cloudy urine with urinalysis suggestive of urinary tract infection.  Suffering from early sepsis secondary to complicated urinary tract infection and therefore was initially placed on cefepime and vancomycin by the emergency department staff.  Patient was also initiated on intravenous fluids.  The hospitalist group was then called to assess the patient for admission to the hospital. Review of Systems:   Review of Systems  Constitutional: Positive for fever and  malaise/fatigue.  Musculoskeletal: Positive for falls.  Neurological: Positive for weakness.  All other systems reviewed and are negative.   Past Medical History:  Diagnosis Date  . BP (high blood pressure) 04/16/2015  . BPH (benign prostatic hyperplasia)   . BPH with obstruction/lower urinary tract symptoms 04/20/2015  . Cancer ([place])    skin  . Carotid artery stenosis 11/11/2016  . Cerebral palsy ([place])   . Elevated PSA   . Hematuria   . Hemiplegia ([place]) 09/13/2011   Overview:  Upper extremity most affected   . History of elevated PSA 04/20/2015  . Hydronephrosis   . Kidney stone   . Nocturia   . Obstructive apnea 12/29/2014  . Osteoporosis   . Partial epilepsy with impairment of consciousness ([place]) 09/13/2011   [id number].  none since starting keppra  . Right hemiplegia ([place]) 09/13/2011  . Seizures ([place])    1956-62. esolved after brain surgery  . Sleep apnea    CPAP  . Urinary retention     Past Surgical History:  Procedure Laterality Date  . APPENDECTOMY    . arm surgery Right    child  . [place]  . CATARACT EXTRACTION W/PHACO Left 09/13/2017   Procedure: CATARACT EXTRACTION PHACO AND INTRAOCULAR LENS PLACEMENT ([place]) LEFT;  Surgeon: Leandrew Koyanagi, MD;  Location: [place];  Service: Ophthalmology;  Laterality: Left;  sleep apnea  . CATARACT EXTRACTION W/PHACO Right 11/28/2017   Procedure: CATARACT EXTRACTION PHACO AND INTRAOCULAR LENS PLACEMENT ([facility]);  Surgeon: Leandrew Koyanagi, MD;  Location: [facility];  Service: Ophthalmology;  Laterality: Right;  [place] 00:55 AP% 9.2 CDE 5.04 Fluid pack lot # [id number]  . LEG SURGERY Right    child  .  TONSILLECTOMY       reports that he has never smoked. He has never used smokeless tobacco. He reports that he does not drink alcohol and does not use drugs.  Allergies  Allergen Reactions  . Oxycodone-Acetaminophen Other (See Comments)    Causes hyperness    Family History  Problem Relation Age of Onset  .  Breast cancer Maternal Aunt   . Dementia Mother   . CAD Father   . Prostate cancer Neg Hx   . Bladder Cancer Neg Hx   . Kidney cancer Neg Hx      Prior to Admission medications   Medication Sig Start Date End Date Taking? Authorizing Provider  acidophilus (RISAQUAD) CAPS capsule Take 1 capsule by mouth 2 (two) times daily.     [provider]  aspirin EC 81 MG tablet Take 81 mg by mouth daily.    [provider]  Black Pepper-Turmeric (TURMERIC COMPLEX/BLACK PEPPER) 3-500 MG CAPS Take by mouth.    [provider]  CAPSICUM, CAYENNE, PO Take 25 mg by mouth daily.     [provider]  Cholecalciferol (VITAMIN D3) 1.25 MG (50000 UT) CAPS TK ONE C PO ONCE A WEEK 08/04/18   [provider]  Coenzyme Q10 (COQ10) 200 MG CAPS Take by mouth.    [provider]  CRANBERRY PO Take 1 capsule by mouth daily.     [provider]  finasteride (PROSCAR) 5 MG tablet Take 1 tablet (5 mg total) by mouth daily. 11/21/19   McGowan, Carollee Herter A, PA-C  Flaxseed Oil (LINSEED OIL) OIL Take 24ml by mouth daily    [provider]  levETIRAcetam (KEPPRA) 750 MG tablet Take 1 tablet (750 mg total) by mouth 2 (two) times daily. 06/18/16   Enedina Finner, MD  Misc Natural Products (ESTROVEN + ENERGY MAX STRENGTH) TABS Take by mouth.    [provider]  OVER THE COUNTER MEDICATION Take 1 capsule by mouth daily. chanca piedra     [provider]  OVER THE COUNTER MEDICATION Take 29 mg by mouth daily. Black Strap Molasses    [provider]  phenytoin (DILANTIN) 100 MG ER capsule Take 300 mg by mouth at bedtime.     [provider]  tamsulosin (FLOMAX) 0.4 MG CAPS capsule Take 1 capsule (0.4 mg total) by mouth daily. 11/21/19   Michiel Cowboy A, PA-C  triamcinolone cream (KENALOG) 0.1 % Apply topically 2 (two) times daily. 04/02/20   [provider]  Turmeric 500 MG CAPS Take 500 mg by mouth 2 (two) times daily.      [provider]  vitamin C (ASCORBIC ACID) 500 MG tablet Take 1,000 mg by mouth 2 (two) times daily.     [provider]    Physical Exam: Vitals:   Jun 27, 2020 2206 06/27/2020 2208 06/13/20 0003 06/13/20 0055  BP: 125/61  (!) 145/66 (!) 129/58  Pulse: (!) 105  98 99  Resp: (!) 25  (!) 28 (!) 25  Temp: (!) 101.3 F (38.5 C)   99.7 F (37.6 C)  TempSrc: Oral   Oral  SpO2: 95%  94% 93%  Weight:  81.6 kg    Height:  5\' 8"  (1.727 m)      Constitutional: Lethargic but arousable and oriented x3, no associated distress.   Skin: no rashes, no lesions, poor skin turgor noted.  Eyes: Pupils are equally reactive to light.  No evidence of scleral icterus or conjunctival pallor.  ENMT: Dry  mucous membranes noted.  Posterior pharynx clear of any exudate or lesions.   Neck: normal, supple, no masses, no thyromegaly.  No evidence of jugular venous distension.   Respiratory: clear to auscultation bilaterally, no wheezing, no crackles. Normal respiratory effort. No accessory muscle use.  Cardiovascular: Regular rate and rhythm, no murmurs / rubs / gallops. No extremity edema. 2+ pedal pulses. No carotid bruits.  Chest:   Nontender without crepitus or deformity.   Back:   Nontender without crepitus or deformity. Abdomen: Abdomen is soft and nontender.  No evidence of intra-abdominal masses.  Positive bowel sounds noted in all quadrants.   Musculoskeletal: Notable contracture of the right upper extremity which is chronic.  Associated poor muscle tone of the right upper and lower extremities.  No notable tenderness of any of the extremities.   Neurologic: Notable significant weakness of the right upper and right lower extremity which is chronic.  Patient is following all commands.  Sensation is grossly intact.  Patient is responsive to verbal and painful stimuli.  Psychiatric: Patient exhibits normal mood with flat affect.  Patient seems to possess insight as to their current situation.      Labs on Admission: I have personally reviewed following labs and imaging studies -   CBC: Recent Labs  Lab 06/05/2020 2213  WBC 10.0  NEUTROABS 8.8*  HGB 14.2  HCT 41.3  MCV 96.5  PLT 123456   Basic Metabolic Panel: No results for input(s): NA, K, CL, CO2, GLUCOSE, BUN, CREATININE, CALCIUM, MG, PHOS in the last 168 hours. GFR: CrCl cannot be calculated (Patient's most recent lab result is older than the maximum 21 days allowed.). Liver Function Tests: No results for input(s): AST, ALT, ALKPHOS, BILITOT, PROT, ALBUMIN in the last 168 hours. No results for input(s): LIPASE, AMYLASE in the last 168 hours. No results for input(s): AMMONIA in the last 168 hours. Coagulation Profile: Recent Labs  Lab 05/28/2020 2213  INR 1.0   Cardiac Enzymes: No results for input(s): CKTOTAL, CKMB, CKMBINDEX, TROPONINI in the last 168 hours. BNP (last 3 results) No results for input(s): PROBNP in the last 8760 hours. HbA1C: No results for input(s): HGBA1C in the last 72 hours. CBG: No results for input(s): GLUCAP in the last 168 hours. Lipid Profile: No results for input(s): CHOL, HDL, LDLCALC, TRIG, CHOLHDL, LDLDIRECT in the last 72 hours. Thyroid Function Tests: No results for input(s): TSH, T4TOTAL, FREET4, T3FREE, THYROIDAB in the last 72 hours. Anemia Panel: No results for input(s): VITAMINB12, FOLATE, FERRITIN, TIBC, IRON, RETICCTPCT in the last 72 hours. Urine analysis:    Component Value Date/Time   COLORURINE YELLOW (A) 06/08/2020 2237   APPEARANCEUR CLOUDY (A) 05/22/2020 2237   APPEARANCEUR Cloudy (A) 11/21/2019 1350   LABSPEC 1.017 05/16/2020 2237   LABSPEC 1.005 09/16/2012 0037   PHURINE 5.0 05/27/2020 2237   GLUCOSEU NEGATIVE 05/16/2020 2237   GLUCOSEU Negative 09/16/2012 0037   HGBUR LARGE (A) 05/22/2020 2237   BILIRUBINUR NEGATIVE 06/05/2020 2237   BILIRUBINUR Negative 11/21/2019 1350   BILIRUBINUR Negative 09/16/2012 0037   KETONESUR 5 (A) 06/02/2020 2237   PROTEINUR  100 (A) 06/09/2020 2237   NITRITE POSITIVE (A) 06/09/2020 2237   LEUKOCYTESUR MODERATE (A) 06/02/2020 2237   LEUKOCYTESUR Trace 09/16/2012 0037    Radiological Exams on Admission - Personally Reviewed: CT Head Wo Contrast  Result Date: 05/13/2020 CLINICAL DATA:  Fall, head injury EXAM: CT HEAD WITHOUT CONTRAST TECHNIQUE: Contiguous axial images were obtained from the base of the skull through the  vertex without intravenous contrast. 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 Left temporal occipital and frontotemporal craniotomies have been performed. Multiple surgical clips are seen in the region of the superior sagittal sinus. Cystic encephalomalacia appears to communicate with the left lateral ventricle that this likely results from imperceptibility of the dividing membrane secondary to severe encephalomalacia. These findings are all unchanged from prior examination. No evidence of acute intracranial hemorrhage or infarct. No abnormal mass effect or midline shift. Ventricular size involving the right lateral ventricle, third ventricle, and fourth ventricle is normal. Cerebellum is unremarkable. Vascular: No asymmetric hyperdense vasculature at the skull base. Skull: No acute calvarial fracture. Sinuses/Orbits: The paranasal sinuses are clear. The orbits are unremarkable. Other: The mastoid air cells and middle ear cavities are clear. Small left parietal scalp hematoma is seen at the vertex. IMPRESSION: Small left parietal scalp hematoma. No evidence of acute intracranial hemorrhage or infarct. Stable extensive encephalomalacia involving the left cerebral hemisphere possibly the sequela of remote MCA territory infarction. Postsurgical changes again noted with left calvarium. Electronically Signed   By: Fidela Salisbury MD   On: 05/28/2020 23:35   DG Chest  Port 1 View  Result Date: 06/11/2020 CLINICAL DATA:  Multiple falls, weakness EXAM: PORTABLE CHEST 1 VIEW COMPARISON:  None. FINDINGS: Single frontal view of the chest demonstrates an unremarkable cardiac silhouette. Interstitial prominence throughout the lungs is age indeterminate, favor multifocal areas of scarring. There is no airspace disease, effusion, or pneumothorax. There are no acute displaced fractures. IMPRESSION: 1. Diffuse interstitial prominence most compatible with scarring. No acute airspace disease. 2. No acute fracture. Electronically Signed   By: Randa Ngo M.D.   On: 05/26/2020 22:32    EKG: Personally reviewed.  Rhythm is sinus tachycardia with heart rate of 106 bpm.  No dynamic ST segment changes appreciated.  Assessment/Plan Principal Problem:   Sepsis secondary to UTI [facility])   Patient presenting with several days of subjective fevers generalized weakness and poor appetite with multiple falls   Multiple SIRS criteria noted on arrival with urinalysis suggestive of urinary tract infection in a patient with longstanding history of BPH.  Patient was initially started on cefepime and vancomycin by the emergency department staff.  I will de-escalate the patient to intravenous ceftriaxone monotherapy for now  Hydrating patient with intravenous isotonic fluids  Blood and urine cultures have been obtained  If patient clinically deteriorates or develops abdominal pain will proceed with CT imaging of the abdomen and pelvis  Suspected gram-negative organism.  Active Problems:   Fall at home, initial encounter   Patient reports 2 falls at home earlier in the day on 12/31.  Patient complains of progressively worsening weakness beyond his baseline right-sided weakness over the past several days  This is all likely secondary to sepsis  Treating underlying infection and hydrating with intravenous fluids which should hopefully result in improvement of associated  weakness  PT evaluation    OSA on CPAP   Placing patient on CPAP nightly per home regimen    Epilepsy ([place])   Continuing home regimen of antiepileptic therapy    BPH without obstruction/lower urinary tract symptoms    Continue home regimen of Flomax and Proscar.   Code Status:  Full code Family Communication: Deferred  Status is: Observation  The patient remains OBS appropriate and will d/c before 2 midnights.  Dispo: The patient is from: Home  Anticipated d/c is to: Home              Anticipated d/c date is: 2 days              Patient currently is not medically stable to d/c.        Vernelle Emerald MD [facility] Pager [phone]  If 7PM-7AM, please contact night-coverage [email or web address] Use universal [place] password for that web site. If you do not have the password, please call the hospital operator.  06/13/2020, 2:42 AM

## 2020-06-13 NOTE — ED Notes (Signed)
Pt provided breakfast tray; able to feed self. 

## 2020-06-13 DEATH — deceased

## 2020-06-14 ENCOUNTER — Inpatient Hospital Stay: Payer: Medicare Other

## 2020-06-14 ENCOUNTER — Encounter: Payer: Self-pay | Admitting: Internal Medicine

## 2020-06-14 DIAGNOSIS — N390 Urinary tract infection, site not specified: Secondary | ICD-10-CM | POA: Diagnosis not present

## 2020-06-14 DIAGNOSIS — U071 COVID-19: Secondary | ICD-10-CM | POA: Diagnosis not present

## 2020-06-14 DIAGNOSIS — A419 Sepsis, unspecified organism: Secondary | ICD-10-CM | POA: Diagnosis not present

## 2020-06-14 LAB — BLOOD CULTURE ID PANEL (REFLEXED) - BCID2

## 2020-06-14 LAB — CBC
HCT: 36 % — ABNORMAL LOW (ref 39.0–52.0)
Hemoglobin: 13.1 g/dL (ref 13.0–17.0)
MCH: 33.8 pg (ref 26.0–34.0)
MCHC: 36.4 g/dL — ABNORMAL HIGH (ref 30.0–36.0)
MCV: 92.8 fL (ref 80.0–100.0)
Platelets: 254 10*3/uL (ref 150–400)
RBC: 3.88 MIL/uL — ABNORMAL LOW (ref 4.22–5.81)
RDW: 12.3 % (ref 11.5–15.5)
WBC: 7.5 10*3/uL (ref 4.0–10.5)
nRBC: 0 % (ref 0.0–0.2)

## 2020-06-14 LAB — COMPREHENSIVE METABOLIC PANEL
ALT: 75 U/L — ABNORMAL HIGH (ref 0–44)
AST: 193 U/L — ABNORMAL HIGH (ref 15–41)
Albumin: 2.3 g/dL — ABNORMAL LOW (ref 3.5–5.0)
Alkaline Phosphatase: 36 U/L — ABNORMAL LOW (ref 38–126)
Anion gap: 13 (ref 5–15)
BUN: 13 mg/dL (ref 8–23)
CO2: 20 mmol/L — ABNORMAL LOW (ref 22–32)
Calcium: 8.1 mg/dL — ABNORMAL LOW (ref 8.9–10.3)
Chloride: 100 mmol/L (ref 98–111)
Creatinine, Ser: 0.89 mg/dL (ref 0.61–1.24)
GFR, Estimated: >60 mL/min (ref >60)
Glucose, Bld: 103 mg/dL — ABNORMAL HIGH (ref 70–99)
Potassium: 3.5 mmol/L (ref 3.5–5.1)
Sodium: 133 mmol/L — ABNORMAL LOW (ref 135–145)
Total Bilirubin: 0.8 mg/dL (ref 0.3–1.2)
Total Protein: 5.9 g/dL — ABNORMAL LOW (ref 6.5–8.1)

## 2020-06-14 LAB — FERRITIN: Ferritin: 681 ng/mL — ABNORMAL HIGH (ref 24–336)

## 2020-06-14 LAB — FIBRIN DERIVATIVES D-DIMER (ARMC ONLY): Fibrin derivatives D-dimer (ARMC): 1556.93 ng/mL (FEU) — ABNORMAL HIGH (ref 0.00–499.00)

## 2020-06-14 LAB — C-REACTIVE PROTEIN: CRP: 13.8 mg/dL — ABNORMAL HIGH (ref <1.0)

## 2020-06-14 MED ORDER — ENOXAPARIN SODIUM 40 MG/0.4ML ~~LOC~~ SOLN
40.0000 mg | SUBCUTANEOUS | Status: DC
  Administered 2020-06-15 – 2020-06-17 (×3): 40 mg via SUBCUTANEOUS
  Filled 2020-06-14 (×5): qty 0.4

## 2020-06-14 MED ORDER — METOPROLOL TARTRATE 5 MG/5ML IV SOLN
5.0000 mg | Freq: Four times a day (QID) | INTRAVENOUS | Status: DC | PRN
  Administered 2020-06-14 – 2020-06-16 (×3): 5 mg via INTRAVENOUS
  Filled 2020-06-14 (×2): qty 5

## 2020-06-14 MED ORDER — IOHEXOL 350 MG/ML SOLN
75.0000 mL | Freq: Once | INTRAVENOUS | Status: AC | PRN
  Administered 2020-06-15: 75 mL via INTRAVENOUS

## 2020-06-14 MED ORDER — METHYLPREDNISOLONE SODIUM SUCC 40 MG IJ SOLR
40.0000 mg | Freq: Two times a day (BID) | INTRAMUSCULAR | Status: DC
  Administered 2020-06-14: 40 mg via INTRAVENOUS
  Filled 2020-06-14 (×2): qty 1

## 2020-06-14 MED ORDER — HYDROXYZINE HCL 10 MG PO TABS
10.0000 mg | ORAL_TABLET | Freq: Once | ORAL | Status: AC
  Administered 2020-06-14: 10 mg via ORAL
  Filled 2020-06-14: qty 1

## 2020-06-14 NOTE — Progress Notes (Signed)
Cross Cover Patient with sepsis, UTI and coronavirus now with fever, tachycardia and new oxgen requirement.  Started solumedrol 40 BID  continue rocephin CTA chest r/o pna development or PE  CLINICAL DATA:  COVID pneumonia, elevated D-dimer  EXAM: CT ANGIOGRAPHY CHEST WITH CONTRAST  TECHNIQUE: Multidetector CT imaging of the chest was performed using the standard protocol during bolus administration of intravenous contrast. Multiplanar CT image reconstructions and MIPs were obtained to evaluate the vascular anatomy.  CONTRAST:  89mL OMNIPAQUE IOHEXOL 350 MG/ML SOLN  COMPARISON:  None.  FINDINGS: Cardiovascular: There is adequate opacification of the pulmonary arterial tree. No intraluminal filling defect identified to suggest acute pulmonary embolism. The central pulmonary arteries are enlarged in keeping with changes of pulmonary arterial hypertension. No significant coronary artery calcification. Global cardiac size within normal limits. Small fluid noted within the superior pericardial recess. Mild atherosclerotic calcification noted within the thoracic aorta. No aortic aneurysm.  Mediastinum/Nodes: There is shotty right hilar, subcarinal, and right paratracheal adenopathy, likely reactive in nature. Esophagus is unremarkable. Thyroid unremarkable. Small hiatal hernia.  Lungs/Pleura: There is extensive, asymmetric, diffuse ground-glass pulmonary infiltrate and pulmonary consolidation, likely infectious or inflammatory in nature. No pneumothorax. Trace left and small right pleural effusions are present with associated right basilar compressive atelectasis. Central airways are widely patent.  Upper Abdomen: Mild bilateral perinephric stranding is partially visualized though the kidneys are largely excluded from view. No acute abnormality identified.  Musculoskeletal: No acute bone abnormality.  Review of the MIP images confirms the above  findings.  IMPRESSION: No pulmonary embolism.  Extensive multifocal ground-glass pulmonary infiltrate and pulmonary consolidation with associated small bilateral pleural effusions. The findings are in keeping with atypical infection in the acute setting and, while not pathognomonic of COVID-19 pneumonia, can be seen the setting of acute COVID infection.  Aortic Atherosclerosis (ICD10-I70.0).  Plan - supplement/wean oxygen ans able keep sats above 88 Patient would benefit from incentive spirometry/flutter valve and mobilization

## 2020-06-14 NOTE — Evaluation (Addendum)
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 Found to be COVID + 06/14/19    Clinical Impression  Pt was seen for PT evaluation as prior to admission pt was independent without AD, living alone, & driving. On this date, pt requires min assist for rolling, mod assist supine>sit. Pt c/o stiffness in BLE throughout session. Pt found to be incontinent of bowels & PT provides total assist for peri hygiene while pt rolls in bed. Pt has limited use of RUE with all mobility 2/2 hx of CP but completes sit>stand with min assist. PT provides mod/max assist for stand pivot bed>recliner with cuing for hand placement and pt demonstrating impaired balance throughout. Pt performs BLE strengthening exercises with instructional cuing for technique. PT strongly encourages pt to get OOB to recliner at least for meals & longer if he can tolerate. Educated pt on importance of being upright vs supine in bed. Will continue to follow pt acutely to progress gait with LRAD & focus on strength, endurance, & balance.     Follow Up Recommendations SNF    Equipment Recommendations   (TBD in next venue)    Recommendations for Other Services       Precautions / Restrictions Precautions Precautions: Fall Precaution Comments: RUE weakness 2/2 hx of CP Restrictions Weight Bearing Restrictions: No      Mobility  Bed Mobility Overal bed mobility: Needs Assistance Bed Mobility: Rolling;Supine to Sit Rolling: Min assist   Supine to sit: Mod assist;HOB elevated     General bed mobility comments:  extra time to position LE 2/2 pt reporting stiffness    Transfers Overall transfer level: Needs assistance   Transfers: Sit to/from Stand;Stand Pivot Transfers Sit to Stand: Min assist;From elevated surface Stand pivot transfers: Max assist;Mod assist (HHA)          Ambulation/Gait                Stairs            Wheelchair Mobility    Modified Rankin (Stroke Patients Only)       Balance Overall balance assessment: Needs assistance Sitting-balance support: No upper extremity supported;Feet supported Sitting balance-Leahy Scale: Fair Sitting balance - Comments: supervision static sitting balance   Standing balance support: Single extremity supported Standing balance-Leahy Scale: Poor Standing balance comment: mod/max during stand pivot 2/2 impaired balance                             Pertinent Vitals/Pain Pain Assessment: Faces Faces Pain Scale: Hurts a little bit Pain Location: BLE Pain Descriptors / Indicators: Discomfort Pain Intervention(s): Monitored during session;Repositioned    Home Living Family/patient expects to be discharged to:: Private residence Living Arrangements: Alone Available Help at Discharge: Neighbor;Available PRN/intermittently Type of Home: House Home Access: Stairs to enter Entrance Stairs-Rails: Doctor, general practice of Steps: 2 Home Layout: One level Home Equipment: Cane - single point Additional Comments: reports his church plans to put a ramp in    Prior Function Level of Independence: Independent         Comments: Pt reports no AD device for mobility/ADLs  and drives     Hand Dominance   Dominant Hand: Left    Extremity/Trunk Assessment   Upper Extremity Assessment Upper Extremity Assessment: RUE deficits/detail RUE Deficits / Details: hx of hemiplegia 2/2 CP - limited grip/elbow extension. Increased tone LUE Deficits / Details: WFL grossly    Lower Extremity Assessment Lower  Extremity Assessment: Generalized weakness       Communication   Communication: No difficulties  Cognition Arousal/Alertness: Awake/alert Behavior During Therapy: WFL for tasks assessed/performed Overall Cognitive Status: Within Functional Limits for tasks assessed                                        General Comments General comments (skin integrity, edema, etc.): pt on 2L/min supplemental oxygen via nasal cannula throughout session, SpO2 >90% at rest in bed, 96% & HR = 108 bpm after transferring to recliner,    Exercises General Exercises - Lower Extremity Long Arc Quad: AROM;Right;Left;Both;10 reps;Seated   Assessment/Plan    PT Assessment Patient needs continued PT services  PT Problem List Decreased strength;Decreased balance;Pain;Decreased mobility;Decreased knowledge of use of DME;Cardiopulmonary status limiting activity;Decreased safety awareness;Decreased coordination;Decreased activity tolerance       PT Treatment Interventions DME instruction;Functional mobility training;Balance training;Patient/family education;Wheelchair mobility training;Gait training;Therapeutic activities;Neuromuscular re-education;Stair training;Therapeutic exercise;Manual techniques    PT Goals (Current goals can be found in the Care Plan section)  Acute Rehab PT Goals Patient Stated Goal: return to PLOF PT Goal Formulation: With patient Time For Goal Achievement: 06/28/20 Potential to Achieve Goals: Fair    Frequency Min 2X/week   Barriers to discharge Decreased caregiver support;Inaccessible home environment      Co-evaluation               AM-PAC PT "6 Clicks" Mobility  Outcome Measure Help needed turning from your back to your side while in a flat bed without using bedrails?: A Lot Help needed moving from lying on your back to sitting on the side of a flat bed without using bedrails?: A Lot Help needed moving to and from a bed to a chair (including a  wheelchair)?: A Lot Help needed standing up from a chair using your arms (e.g., wheelchair or bedside chair)?: A Little Help needed to walk in hospital room?: A Lot Help needed climbing 3-5 steps with a railing? : A Lot 6 Click Score: 13    End of Session Equipment Utilized During Treatment: Oxygen; Addendum: gait belt Activity Tolerance: Patient tolerated treatment well Patient left: in chair;with chair alarm set;with call bell/phone within reach Nurse Communication: Mobility status PT Visit Diagnosis: Unsteadiness on feet (R26.81);Muscle weakness (generalized) (M62.81);Difficulty in walking, not elsewhere classified (R26.2)    Time: 4818-5631 PT Time Calculation (min) (ACUTE ONLY): 28 min   Charges:   PT Evaluation $PT Eval Low Complexity: 1 Low PT Treatments $Therapeutic Activity: 23-37 mins        Aleda Grana, PT, DPT 06/14/20, 1:23 PM   Sandi Mariscal 06/14/2020, 1:08 PM

## 2020-06-14 NOTE — Progress Notes (Signed)
   06/14/20 2046  Assess: MEWS Score  Temp (!) 102.7 F (39.3 C)  BP 138/70  Pulse Rate (!) 116  Resp 20  SpO2 (!) 89 %  O2 Device Nasal Cannula  O2 Flow Rate (L/min) 3.5 L/min  Assess: MEWS Score  MEWS Temp 2  MEWS Systolic 0  MEWS Pulse 2  MEWS RR 0  MEWS LOC 0  MEWS Score 4  MEWS Score Color Red  Assess: if the MEWS score is Yellow or Red  Were vital signs taken at a resting state? Yes  Focused Assessment Change from prior assessment (see assessment flowsheet)  Early Detection of Sepsis Score *See Row Information* Medium  MEWS guidelines implemented *See Row Information* Yes  Treat  Pain Scale 0-10  Pain Score 0  Take Vital Signs  Increase Vital Sign Frequency  Red: Q 1hr X 4 then Q 4hr X 4, if remains red, continue Q 4hrs  Notify: Charge Nurse/RN  Name of Charge Nurse/RN Notified TC RN  Date Charge Nurse/RN Notified 06/14/20  Time Charge Nurse/RN Notified 2058  Notify: Provider  Date Provider Notified 06/14/20  Time Provider Notified 2100  Notification Type Page  Notification Reason Change in status

## 2020-06-14 NOTE — Progress Notes (Signed)
PHARMACY - PHYSICIAN COMMUNICATION CRITICAL VALUE ALERT - BLOOD CULTURE IDENTIFICATION (BCID)  Collected 12/31:  1 aerobic of 4 bottles positive for Staph Epi, mecA - not detected.  Suspected containment.  Pt on Ceftriaxone for 5 more days pending UCx results.  Name of physician (or Provider) Contacted: Cliffton Asters  Changes to prescribed antibiotics recommended: None  Provider agreed no changes warranted at this time.  Otelia Sergeant, PharmD, [facility] 06/14/2020 3:34 AM

## 2020-06-14 NOTE — Progress Notes (Signed)
   06/14/20 1726  Assess: MEWS Score  Temp 98.3 F (36.8 C)  BP (!) 150/75  Pulse Rate (!) 123  Resp 20  Level of Consciousness Alert  SpO2 94 %  O2 Device Room Air  Patient Activity (if Appropriate) In bed  O2 Flow Rate (L/min) 2 L/min  Assess: if the MEWS score is Yellow or Red  Were vital signs taken at a resting state? Yes  Focused Assessment Change from prior assessment (see assessment flowsheet)  Early Detection of Sepsis Score *See Row Information* Medium  MEWS guidelines implemented *See Row Information* No, vital signs rechecked  Treat  MEWS Interventions Administered prn meds/treatments  Pain Scale 0-10  Pain Score 0  Pain Intervention(s) Refused  Take Vital Signs  Increase Vital Sign Frequency  Yellow: Q 2hr X 2 then Q 4hr X 2, if remains yellow, continue Q 4hrs  Notify: Charge Nurse/RN  Name of Charge Nurse/RN Notified Moises Blood, RN  Date Charge Nurse/RN Notified 06/14/20  Time Charge Nurse/RN Notified 1740  Notify: Provider  Provider Name/Title Dr. Mayford Knife  Date Provider Notified 06/14/20  Time Provider Notified 1740  Notification Type Page  Notification Reason Change in status  Response See new orders  Date of Provider Response 06/14/20  Time of Provider Response 1746  Notify: Rapid Response  Name of Rapid Response RN Notified not notified  Document  Patient Outcome Not stable and remains on department  Progress note created (see row info) Yes  Yellow MEWS score protocol initiated. MD, charge nurse and nurse tech have been notified.

## 2020-06-14 NOTE — Progress Notes (Signed)
   06/13/20 2215  Assess: MEWS Score  Temp (!) 103.3 F (39.6 C) (see MAR)  BP (!) 147/73  Pulse Rate (!) 109  Resp 18  SpO2 96 %  O2 Device Nasal Cannula  O2 Flow Rate (L/min) 2 L/min  Assess: MEWS Score  MEWS Temp 2  MEWS Systolic 0  MEWS Pulse 1  MEWS RR 0  MEWS LOC 0  MEWS Score 3  MEWS Score Color Yellow  Assess: if the MEWS score is Yellow or Red  Were vital signs taken at a resting state? Yes  Focused Assessment No change from prior assessment  Early Detection of Sepsis Score *See Row Information* Medium  MEWS guidelines implemented *See Row Information* Yes  Treat  MEWS Interventions Administered prn meds/treatments  Pain Scale 0-10  Pain Score 0  Take Vital Signs  Increase Vital Sign Frequency  Yellow: Q 2hr X 2 then Q 4hr X 2, if remains yellow, continue Q 4hrs  Escalate  MEWS: Escalate Yellow: discuss with charge nurse/RN and consider discussing with provider and RRT  Notify: Charge Nurse/RN  Name of Charge Nurse/RN Notified Barney Drain, RN  Date Charge Nurse/RN Notified 06/14/19  Time Charge Nurse/RN Notified 2224  Inserted for [employer] RN

## 2020-06-14 NOTE — Progress Notes (Signed)
PROGRESS NOTE    Dylan Sullivan  [id number] DOB: 04-02-1946 DOA: 06/10/2020 PCP: Casilda Carls, MD    Assessment & Plan:   Principal Problem:   Sepsis secondary to UTI [facility]) Active Problems:   Essential (primary) hypertension   BPH without obstruction/lower urinary tract symptoms   OSA on CPAP   Fall at home, initial encounter   Epilepsy [facility])   Complicated UTI (urinary tract infection)   Sepsis: meets criteria with fever, tachycardia, tachypnea & likely UTI. Procal is 14.51. Continue on IV ceftriaxone. If pt continues to spike fevers, will broaden abx coverage. Blood cxs show likely containment, will repeat blood cxs.   Possible UTI: UA is positive. Urine cx is pending. Continue on IV ceftriaxone  COVID19 infection: continue on airborne and contact precautions. CXR was neg for pneumonia. Will hold off starting remdesivir, steroids.   Hx of cerebral palsy: continue w/ supportive care. 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 Will continue to monitor   Transaminitis: likely secondary to [place].   BPH: continue on tamsulosin, finesteride   Fall: PT/OT consulted   OSA: continue on CPAP qhs  Epilepsy: continue on home dose of keppra, phenytoin     DVT prophylaxis: lovenox  Code Status: full  Family Communication: Disposition Plan: depends on PT/OT recs  Status is: Inpatient  Remains inpatient appropriate because:Ongoing diagnostic testing needed not appropriate for outpatient work up, Unsafe d/c plan, IV treatments appropriate due to intensity of illness or inability to take PO and Inpatient level of care appropriate due to severity of illness   Dispo: The patient is from: Home              Anticipated d/c is to: SNF vs home health               Anticipated d/c date is: > 3 days              Patient currently is not medically stable to d/c.     Consultants:      Procedures:     Antimicrobials: ceftriaxone   Subjective: Pt c/o fatigue  Objective: Vitals:   06/13/20 2215 06/13/20 2331 06/14/20 0024 06/14/20 0403  BP: (!) 147/73 122/62  (!) 155/70  Pulse: (!) 109 (!) 106  (!) 109  Resp: 18 16  20   Temp: (!) 103.3 F (39.6 C) (!) 100.7 F (38.2 C) (!) 100.4 F (38 C) (!) 100.9 F (38.3 C)  TempSrc: Oral Oral  Oral  SpO2: 96% 93%  95%  Weight:      Height:        Intake/Output Summary (Last 24 hours) at 06/14/2020 0731 Last data filed at 06/14/2020 0438 Gross per 24 hour  Intake 240 ml  Output 1200 ml  Net -960 ml   Filed Weights   06/10/2020 2208  Weight: 81.6 kg    Examination:  General exam: Appears calm and comfortable  Respiratory system: diminished breath sounds b/l Cardiovascular system: S1 & S2 +. No rubs, gallops or clicks.  Gastrointestinal system: Abdomen is nondistended, soft and nontender. Normal bowel sounds heard. Central nervous system: Alert and awake. Psychiatry: Judgement and insight appear abnormal. Flat mood and affect    Data Reviewed: I have personally reviewed following labs and imaging studies  CBC: Recent Labs  Lab 05/15/2020 2213 06/13/20 0636 06/14/20 0432  WBC 10.0 5.8 7.5  NEUTROABS 8.8* 3.9  --   HGB 14.2 12.6* 13.1  HCT 41.3 36.5* 36.0*  MCV 96.5 96.3 92.8  PLT 253 192 0000000   Basic Metabolic Panel: Recent Labs  Lab 06/13/20 0636 06/14/20 0432  NA 133* 133*  K 3.2* 3.5  CL 101 100  CO2 23 20*  GLUCOSE 98 103*  BUN 16 13  CREATININE 0.96 0.89  CALCIUM 8.4* 8.1*  MG 1.7  --    GFR: Estimated Creatinine Clearance: 70.4 mL/min (by C-G formula based on SCr of 0.89 mg/dL). Liver Function Tests: Recent Labs  Lab 06/13/20 0636 06/14/20 0432  AST 194* 193*  ALT 62* 75*  ALKPHOS 40 36*  BILITOT 0.6 0.8  PROT 5.8* 5.9*  ALBUMIN 2.4* 2.3*   No results for input(s): LIPASE, AMYLASE in the last 168 hours. No results for input(s): AMMONIA in the last 168 hours. Coagulation Profile: Recent  Labs  Lab 06/09/2020 2213  INR 1.0   Cardiac Enzymes: No results for input(s): CKTOTAL, CKMB, CKMBINDEX, TROPONINI in the last 168 hours. BNP (last 3 results) No results for input(s): PROBNP in the last 8760 hours. HbA1C: No results for input(s): HGBA1C in the last 72 hours. CBG: No results for input(s): GLUCAP in the last 168 hours. Lipid Profile: No results for input(s): CHOL, HDL, LDLCALC, TRIG, CHOLHDL, LDLDIRECT in the last 72 hours. Thyroid Function Tests: No results for input(s): TSH, T4TOTAL, FREET4, T3FREE, THYROIDAB in the last 72 hours. Anemia Panel: Recent Labs    06/14/20 0432  FERRITIN 681*   Sepsis Labs: Recent Labs  Lab 05/27/2020 2213 06/13/20 0636  PROCALCITON  --  14.51  LATICACIDVEN 1.7  --     Recent Results (from the past 240 hour(s))  Culture, blood (Routine x 2)     Status: None (Preliminary result)   Collection Time: 05/22/2020 10:13 PM   Specimen: BLOOD  Result Value Ref Range Status   Specimen Description BLOOD BLOOD RIGHT FOREARM  Final   Special Requests   Final    BOTTLES DRAWN AEROBIC AND ANAEROBIC Blood Culture adequate volume   Culture   Final    NO GROWTH 2 DAYS Performed at [facility], [address], [place], [place] [address]    Report Status PENDING  Incomplete  Culture, blood (Routine x 2)     Status: None (Preliminary result)   Collection Time: 06/11/2020 10:30 PM   Specimen: BLOOD  Result Value Ref Range Status   Specimen Description BLOOD BLOOD LEFT ARM  Final   Special Requests   Final    BOTTLES DRAWN AEROBIC AND ANAEROBIC Blood Culture results may not be optimal due to an inadequate volume of blood received in culture bottles   Culture  Setup Time   Final    Organism ID to follow [place] CALLED TO, READ BACK BY AND VERIFIED WITH: NATHAN BLEUE @0237  ON 06/14/20 SKL Performed at [facility], [address] [place], [place] [address]    Culture GRAM  POSITIVE COCCI  Final   Report Status PENDING  Incomplete  Blood Culture ID Panel (Reflexed)     Status: Abnormal   Collection Time: 05/30/2020 10:30 PM  Result Value Ref Range Status   Enterococcus faecalis NOT DETECTED NOT DETECTED Final   Enterococcus Faecium NOT DETECTED NOT DETECTED Final   Listeria monocytogenes NOT DETECTED NOT DETECTED Final   Staphylococcus species DETECTED (A) NOT DETECTED Final    Comment: CRITICAL RESULT CALLED TO, READ BACK BY AND VERIFIED WITH: NATHAN BELUE @0237  ON 06/14/20 SKL    Staphylococcus aureus (  BCID) NOT DETECTED NOT DETECTED Final   Staphylococcus epidermidis DETECTED (A) NOT DETECTED Final    Comment: CRITICAL RESULT CALLED TO, READ BACK BY AND VERIFIED WITH: NATHAN BELUE @0237  ON 06/14/20 SKL    Staphylococcus lugdunensis NOT DETECTED NOT DETECTED Final   Streptococcus species NOT DETECTED NOT DETECTED Final   Streptococcus agalactiae NOT DETECTED NOT DETECTED Final   Streptococcus pneumoniae NOT DETECTED NOT DETECTED Final   Streptococcus pyogenes NOT DETECTED NOT DETECTED Final   A.calcoaceticus-baumannii NOT DETECTED NOT DETECTED Final   Bacteroides fragilis NOT DETECTED NOT DETECTED Final   Enterobacterales NOT DETECTED NOT DETECTED Final   Enterobacter cloacae complex NOT DETECTED NOT DETECTED Final   Escherichia coli NOT DETECTED NOT DETECTED Final   Klebsiella aerogenes NOT DETECTED NOT DETECTED Final   Klebsiella oxytoca NOT DETECTED NOT DETECTED Final   Klebsiella pneumoniae NOT DETECTED NOT DETECTED Final   Proteus species NOT DETECTED NOT DETECTED Final   Salmonella species NOT DETECTED NOT DETECTED Final   Serratia marcescens NOT DETECTED NOT DETECTED Final   Haemophilus influenzae NOT DETECTED NOT DETECTED Final   Neisseria meningitidis NOT DETECTED NOT DETECTED Final   Pseudomonas aeruginosa NOT DETECTED NOT DETECTED Final   Stenotrophomonas maltophilia NOT DETECTED NOT DETECTED Final   Candida albicans NOT DETECTED NOT  DETECTED Final   Candida auris NOT DETECTED NOT DETECTED Final   Candida glabrata NOT DETECTED NOT DETECTED Final   Candida krusei NOT DETECTED NOT DETECTED Final   Candida parapsilosis NOT DETECTED NOT DETECTED Final   Candida tropicalis NOT DETECTED NOT DETECTED Final   Cryptococcus neoformans/gattii NOT DETECTED NOT DETECTED Final   Methicillin resistance mecA/C NOT DETECTED NOT DETECTED Final    Comment: Performed at [facility], [address] [place], [place]  Resp Panel by RT-PCR (Flu A&B, Covid) Nasopharyngeal Swab     Status: Abnormal   Collection Time: 06/13/20  9:18 AM   Specimen: Nasopharyngeal Swab; Nasopharyngeal(NP) swabs in vial transport medium  Result Value Ref Range Status   SARS Coronavirus 2 by RT PCR POSITIVE (A) NEGATIVE Final    Comment: RESULT CALLED TO, READ BACK BY AND VERIFIED WITH: ANGELA ROBBINS 06/13/20 AT 1103 BY ACR (NOTE) SARS-CoV-2 target nucleic acids are DETECTED.  The SARS-CoV-2 RNA is generally detectable in upper respiratory specimens during the acute phase of infection. Positive results are indicative of the presence of the identified virus, but do not rule out bacterial infection or co-infection with other pathogens not detected by the test. Clinical correlation with patient history and other diagnostic information is necessary to determine patient infection status. The expected result is Negative.  Fact Sheet for Patients: 08/11/20  Fact Sheet for Healthcare Providers: BloggerCourse.com  This test is not yet approved or cleared by the [employer] FDA and  has been authorized for detection and/or diagnosis of SARS-CoV-2 by FDA under an Emergency Use Authorization (EUA).  This EUA will remain in effect (meaning this test can  be used) for the duration of  the COVID-19 declaration under Section 564(b)(1) of the Act, 21 U.S.C. section 360bbb-3(b)(1), unless the  authorization is terminated or revoked sooner.     Influenza A by PCR NEGATIVE NEGATIVE Final   Influenza B by PCR NEGATIVE NEGATIVE Final    Comment: (NOTE) The Xpert Xpress SARS-CoV-2/FLU/RSV plus assay is intended as an aid in the diagnosis of influenza from Nasopharyngeal swab specimens and should not be used as a sole basis for treatment. Nasal washings and aspirates are unacceptable  for Xpert Xpress SARS-CoV-2/FLU/RSV testing.  Fact Sheet for Patients: EntrepreneurPulse.com.au  Fact Sheet for Healthcare Providers: IncredibleEmployment.be  This test is not yet approved or cleared by the [place] FDA and has been authorized for detection and/or diagnosis of SARS-CoV-2 by FDA under an Emergency Use Authorization (EUA). This EUA will remain in effect (meaning this test can be used) for the duration of the COVID-19 declaration under Section 564(b)(1) of the Act, 21 U.S.C. section 360bbb-3(b)(1), unless the authorization is terminated or revoked.  Performed at [facility], [address], [place], [place] [address]          Radiology Studies: CT Head Wo Contrast  Result Date: 05/24/2020 CLINICAL DATA:  Fall, head injury EXAM: CT HEAD WITHOUT CONTRAST TECHNIQUE: Contiguous axial images were obtained from the base of the skull through the vertex without intravenous contrast. 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 Left temporal occipital and frontotemporal craniotomies have been performed. Multiple surgical clips are seen in the region of the superior sagittal sinus. Cystic encephalomalacia appears to communicate with the left lateral ventricle that this likely results from imperceptibility of the dividing membrane secondary to severe encephalomalacia. These findings are all  unchanged from prior examination. No evidence of acute intracranial hemorrhage or infarct. No abnormal mass effect or midline shift. Ventricular size involving the right lateral ventricle, third ventricle, and fourth ventricle is normal. Cerebellum is unremarkable. Vascular: No asymmetric hyperdense vasculature at the skull base. Skull: No acute calvarial fracture. Sinuses/Orbits: The paranasal sinuses are clear. The orbits are unremarkable. Other: The mastoid air cells and middle ear cavities are clear. Small left parietal scalp hematoma is seen at the vertex. IMPRESSION: Small left parietal scalp hematoma. No evidence of acute intracranial hemorrhage or infarct. Stable extensive encephalomalacia involving the left cerebral hemisphere possibly the sequela of remote MCA territory infarction. Postsurgical changes again noted with left calvarium. Electronically Signed   By: Fidela Salisbury MD   On: 06/09/2020 23:35   DG Chest Port 1 View  Result Date: 05/25/2020 CLINICAL DATA:  Multiple falls, weakness EXAM: PORTABLE CHEST 1 VIEW COMPARISON:  None. FINDINGS: Single frontal view of the chest demonstrates an unremarkable cardiac silhouette. Interstitial prominence throughout the lungs is age indeterminate, favor multifocal areas of scarring. There is no airspace disease, effusion, or pneumothorax. There are no acute displaced fractures. IMPRESSION: 1. Diffuse interstitial prominence most compatible with scarring. No acute airspace disease. 2. No acute fracture. Electronically Signed   By: Randa Ngo M.D.   On: 06/11/2020 22:32        Scheduled Meds: . enoxaparin (LOVENOX) injection  40 mg Subcutaneous Q24H  . finasteride  5 mg Oral Daily  . levETIRAcetam  750 mg Oral BID  . phenytoin  300 mg Oral QHS  . tamsulosin  0.4 mg Oral Daily   Continuous Infusions: . cefTRIAXone (ROCEPHIN)  IV Stopped (06/14/20 0610)     LOS: 1 day    Time spent: 32 mins     Wyvonnia Dusky, MD [facility] Pager [phone]  If 7PM-7AM, please contact night-coverage 06/14/2020, 7:31 AM

## 2020-06-15 DIAGNOSIS — J9601 Acute respiratory failure with hypoxia: Secondary | ICD-10-CM | POA: Diagnosis not present

## 2020-06-15 DIAGNOSIS — U071 COVID-19: Secondary | ICD-10-CM | POA: Diagnosis not present

## 2020-06-15 DIAGNOSIS — A419 Sepsis, unspecified organism: Secondary | ICD-10-CM | POA: Diagnosis not present

## 2020-06-15 DIAGNOSIS — N390 Urinary tract infection, site not specified: Secondary | ICD-10-CM | POA: Diagnosis not present

## 2020-06-15 LAB — COMPREHENSIVE METABOLIC PANEL
ALT: 83 U/L — ABNORMAL HIGH (ref 0–44)
AST: 195 U/L — ABNORMAL HIGH (ref 15–41)
Albumin: 2.4 g/dL — ABNORMAL LOW (ref 3.5–5.0)
Alkaline Phosphatase: 40 U/L (ref 38–126)
Anion gap: 10 (ref 5–15)
BUN: 16 mg/dL (ref 8–23)
CO2: 22 mmol/L (ref 22–32)
Calcium: 7.9 mg/dL — ABNORMAL LOW (ref 8.9–10.3)
Chloride: 101 mmol/L (ref 98–111)
Creatinine, Ser: 0.85 mg/dL (ref 0.61–1.24)
GFR, Estimated: >60 mL/min (ref >60)
Glucose, Bld: 112 mg/dL — ABNORMAL HIGH (ref 70–99)
Potassium: 3.7 mmol/L (ref 3.5–5.1)
Sodium: 133 mmol/L — ABNORMAL LOW (ref 135–145)
Total Bilirubin: 0.8 mg/dL (ref 0.3–1.2)
Total Protein: 5.7 g/dL — ABNORMAL LOW (ref 6.5–8.1)

## 2020-06-15 LAB — CBC
HCT: 36.3 % — ABNORMAL LOW (ref 39.0–52.0)
Hemoglobin: 13 g/dL (ref 13.0–17.0)
MCH: 33.3 pg (ref 26.0–34.0)
MCHC: 35.8 g/dL (ref 30.0–36.0)
MCV: 93.1 fL (ref 80.0–100.0)
Platelets: 286 10*3/uL (ref 150–400)
RBC: 3.9 MIL/uL — ABNORMAL LOW (ref 4.22–5.81)
RDW: 12.2 % (ref 11.5–15.5)
WBC: 7.2 10*3/uL (ref 4.0–10.5)
nRBC: 0 % (ref 0.0–0.2)

## 2020-06-15 LAB — FIBRIN DERIVATIVES D-DIMER (ARMC ONLY): Fibrin derivatives D-dimer (ARMC): 1573.32 ng{FEU}/mL — ABNORMAL HIGH (ref 0.00–499.00)

## 2020-06-15 LAB — CULTURE, BLOOD (ROUTINE X 2)

## 2020-06-15 LAB — URINE CULTURE: Culture: >=100000 — AB

## 2020-06-15 LAB — FERRITIN: Ferritin: 952 ng/mL — ABNORMAL HIGH (ref 24–336)

## 2020-06-15 LAB — C-REACTIVE PROTEIN: CRP: 15.3 mg/dL — ABNORMAL HIGH (ref <1.0)

## 2020-06-15 MED ORDER — SODIUM CHLORIDE 0.9 % IV SOLN
INTRAVENOUS | Status: DC | PRN
  Administered 2020-06-15: 50 mL via INTRAVENOUS
  Administered 2020-06-16: 500 mL via INTRAVENOUS

## 2020-06-15 MED ORDER — DEXAMETHASONE SODIUM PHOSPHATE 10 MG/ML IJ SOLN
6.0000 mg | INTRAMUSCULAR | Status: DC
  Administered 2020-06-15 – 2020-06-20 (×6): 6 mg via INTRAVENOUS
  Filled 2020-06-15 (×2): qty 0.6
  Filled 2020-06-15: qty 1
  Filled 2020-06-15 (×3): qty 0.6
  Filled 2020-06-15: qty 1

## 2020-06-15 MED ORDER — SODIUM CHLORIDE 0.9 % IV SOLN
200.0000 mg | Freq: Once | INTRAVENOUS | Status: AC
  Administered 2020-06-15 – 2020-06-16 (×2): 200 mg via INTRAVENOUS
  Filled 2020-06-15: qty 40

## 2020-06-15 MED ORDER — CEFAZOLIN SODIUM-DEXTROSE 2-4 GM/100ML-% IV SOLN
2.0000 g | Freq: Three times a day (TID) | INTRAVENOUS | Status: DC
  Administered 2020-06-15 – 2020-06-18 (×10): 2 g via INTRAVENOUS
  Filled 2020-06-15 (×17): qty 100

## 2020-06-15 MED ORDER — SODIUM CHLORIDE 0.9 % IV SOLN
100.0000 mg | Freq: Every day | INTRAVENOUS | Status: AC
  Administered 2020-06-17 – 2020-06-19 (×3): 100 mg via INTRAVENOUS
  Filled 2020-06-15: qty 20
  Filled 2020-06-15: qty 100
  Filled 2020-06-15 (×2): qty 20
  Filled 2020-06-15 (×2): qty 100

## 2020-06-15 MED ORDER — CLINDAMYCIN PHOSPHATE 600 MG/50ML IV SOLN
600.0000 mg | Freq: Three times a day (TID) | INTRAVENOUS | Status: DC
  Administered 2020-06-15: 600 mg via INTRAVENOUS
  Filled 2020-06-15 (×3): qty 50

## 2020-06-15 MED ORDER — ASPIRIN EC 81 MG PO TBEC
81.0000 mg | DELAYED_RELEASE_TABLET | Freq: Every day | ORAL | Status: DC
  Administered 2020-06-15: 81 mg via ORAL
  Filled 2020-06-15 (×2): qty 1

## 2020-06-15 NOTE — Progress Notes (Signed)
   06/15/20 0755  Assess: MEWS Score  Temp 99.6 F (37.6 C)  BP (!) 149/73  Pulse Rate (!) 102  Resp 19  SpO2 (!) 84 %  O2 Device Nasal Cannula  O2 Flow Rate (L/min) 4 L/min  Assess: MEWS Score  MEWS Temp 0  MEWS Systolic 0  MEWS Pulse 1  MEWS RR 0  MEWS LOC 0  MEWS Score 1  MEWS Score Color Green  Assess: if the MEWS score is Yellow or Red  Were vital signs taken at a resting state? Yes  Focused Assessment Change from prior assessment (see assessment flowsheet)  Early Detection of Sepsis Score *See Row Information* High  MEWS guidelines implemented *See Row Information* No, previously yellow, continue vital signs every 4 hours  Treat  MEWS Interventions Administered prn meds/treatments  Pain Scale 0-10  Pain Intervention(s) Refused  Take Vital Signs  Increase Vital Sign Frequency  Red: Q 1hr X 4 then Q 4hr X 4, if remains red, continue Q 4hrs  Escalate  MEWS: Escalate Red: discuss with charge nurse/RN and provider, consider discussing with RRT  Notify: Charge Nurse/RN  Name of Charge Nurse/RN Notified [employer].  Date Charge Nurse/RN Notified 06/15/20  Time Charge Nurse/RN Notified 0800  Notify: Provider  Provider Name/Title Dr. Mayford Knife  Date Provider Notified 06/15/20  Time Provider Notified 0800  Notification Type Call  Notification Reason Change in status  Response See new orders  Date of Provider Response 06/15/20  Time of Provider Response 0801  Notify: Rapid Response  Name of Rapid Response RN Notified not notified  Document  Patient Outcome Transferred/level of care increased  Progress note created (see row info) Yes  MEWS score red since last night. MD notified of changed. Order placed. MD, charge nurse and NT notified of changes and protocol.

## 2020-06-15 NOTE — Consult Note (Signed)
Remdesivir - Pharmacy Brief Note   O:  ALT: 83 CXR: pulmonary infiltrate and pulmonary consolidation, likely infectious or inflammatory in nature SpO2: 99% on 15L   A/P:  Remdesivir 200 mg IVPB once followed by 100 mg IVPB daily x 4 days.   Albina Billet, PharmD, BCPS Clinical Pharmacist 06/15/2020 8:24 AM

## 2020-06-15 NOTE — Progress Notes (Signed)
   06/15/20 1957  Assess: MEWS Score  Temp 97.6 F (36.4 C)  BP (!) 159/74  Pulse Rate (!) 127  ECG Heart Rate (!) 120  Resp 20  SpO2 (!) 88 %  O2 Device Nasal Cannula  Assess: MEWS Score  MEWS Temp 0  MEWS Systolic 0  MEWS Pulse 2  MEWS RR 0  MEWS LOC 0  MEWS Score 2  MEWS Score Color Yellow  Assess: if the MEWS score is Yellow or Red  Were vital signs taken at a resting state? Yes  Focused Assessment No change from prior assessment  Early Detection of Sepsis Score *See Row Information* Low  MEWS guidelines implemented *See Row Information* Yes  Treat  MEWS Interventions Administered prn meds/treatments;Administered scheduled meds/treatments  Pain Scale 0-10  Pain Score 0  Escalate  MEWS: Escalate Yellow: discuss with charge nurse/RN and consider discussing with provider and RRT  Notify: Charge Nurse/RN  Name of Charge Nurse/RN Notified Langston Reusing, RN  Date Charge Nurse/RN Notified 06/15/20  Time Charge Nurse/RN Notified 2350

## 2020-06-15 NOTE — Progress Notes (Signed)
PROGRESS NOTE    Dylan Sullivan  [id number] DOB: February 20, 1946 DOA: 05/19/2020 PCP: Casilda Carls, MD    Assessment & Plan:   Principal Problem:   Sepsis secondary to UTI [facility]) Active Problems:   Essential (primary) hypertension   BPH without obstruction/lower urinary tract symptoms   OSA on CPAP   Fall at home, initial encounter   Epilepsy [facility])   Complicated UTI (urinary tract infection)   Sepsis: meets criteria with fever, tachycardia, tachypnea & likely UTI. Procal is 14.51. Continue on IV ceftriaxone. If pt continues to spike fevers, will broaden abx coverage. Blood cxs show likely containment, repeat blood cxs NGTD  UTI: UA is positive. Urine cx is growing stap epidermidis. Abxs switched to IV cefazolin   COVID19 pneumonia: continue on airborne and contact precautions. CXR was neg for pneumonia but CT chest showed pneumonia. Started on IV remdesivir, steroids.   Acute hypoxic respiratory failure: 84% on 4L [place]. Continue on supplemental oxygen and wean as tolerated   Hx of cerebral palsy: continue w/ supportive care. 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   Transaminitis: likely secondary to COVID19  BPH: continue on tamsulosin, finasteride.   Fall: PT/OT recs SNF   OSA: continue on CPAP qhs  Epilepsy: continue on home dose of keppra, phenytoin     DVT prophylaxis: lovenox  Code Status: full  Family Communication: Disposition Plan: likely d/c to SNF   Status is: Inpatient  Remains inpatient appropriate because:Ongoing diagnostic testing needed not appropriate for outpatient work up, Unsafe d/c plan, IV treatments appropriate due to intensity of illness or inability to take PO and Inpatient level of care appropriate due to severity of illness   Dispo: The patient is from: Home              Anticipated d/c is to: SNF vs home health               Anticipated d/c date is: > 3 days               Patient currently is not medically stable to d/c.     Consultants:      Procedures:    Antimicrobials: cefazolin    Subjective: Pt c/o weakness  Objective: Vitals:   06/14/20 2305 06/15/20 0053 06/15/20 0056 06/15/20 0253  BP: (!) 119/57 129/65  (!) 125/58  Pulse: (!) 105 100 (!) 101 (!) 107  Resp: 20 20  18   Temp: (!) 101.1 F (38.4 C)  98 F (36.7 C) (!) 100.5 F (38.1 C)  TempSrc: Oral  Oral Oral  SpO2: (!) 89% 92% 92% 93%  Weight:      Height:        Intake/Output Summary (Last 24 hours) at 06/15/2020 0751 Last data filed at 06/15/2020 0400 Gross per 24 hour  Intake 0 ml  Output 600 ml  Net -600 ml   Filed Weights   05/26/2020 2208  Weight: 81.6 kg    Examination:  General exam: Appears calm & comfortable  Respiratory system: course breath sounds b/l  Cardiovascular system: S1/S2+. No clicks or rubs  Gastrointestinal system: Abd is soft, obese, NT & hypoactive bowel sounds  Central nervous system: Alert and awake  Psychiatry: Judgement and insight appear abnormal. Flat mood and affect    Data Reviewed: I have personally reviewed following labs and imaging studies  CBC: Recent Labs  Lab 05/22/2020 2213 06/13/20 0636 06/14/20 0432 06/15/20 0400  WBC 10.0 5.8  7.5 7.2  NEUTROABS 8.8* 3.9  --   --   HGB 14.2 12.6* 13.1 13.0  HCT 41.3 36.5* 36.0* 36.3*  MCV 96.5 96.3 92.8 93.1  PLT 253 192 254 [id number]   Basic Metabolic Panel: Recent Labs  Lab 06/13/20 0636 06/14/20 0432 06/15/20 0400  NA 133* 133* 133*  K 3.2* 3.5 3.7  CL 101 100 101  CO2 23 20* 22  GLUCOSE 98 103* 112*  BUN 16 13 16   CREATININE 0.96 0.89 0.85  CALCIUM 8.4* 8.1* 7.9*  MG 1.7  --   --    GFR: Estimated Creatinine Clearance: 73.8 mL/min (by C-G formula based on SCr of 0.85 mg/dL). Liver Function Tests: Recent Labs  Lab 06/13/20 0636 06/14/20 0432 06/15/20 0400  AST 194* 193* 195*  ALT 62* 75* 83*  ALKPHOS 40 36* 40  BILITOT 0.6 0.8 0.8  PROT 5.8* 5.9* 5.7*   ALBUMIN 2.4* 2.3* 2.4*   No results for input(s): LIPASE, AMYLASE in the last 168 hours. No results for input(s): AMMONIA in the last 168 hours. Coagulation Profile: Recent Labs  Lab 05/22/2020 2213  INR 1.0   Cardiac Enzymes: No results for input(s): CKTOTAL, CKMB, CKMBINDEX, TROPONINI in the last 168 hours. BNP (last 3 results) No results for input(s): PROBNP in the last 8760 hours. HbA1C: No results for input(s): HGBA1C in the last 72 hours. CBG: No results for input(s): GLUCAP in the last 168 hours. Lipid Profile: No results for input(s): CHOL, HDL, LDLCALC, TRIG, CHOLHDL, LDLDIRECT in the last 72 hours. Thyroid Function Tests: No results for input(s): TSH, T4TOTAL, FREET4, T3FREE, THYROIDAB in the last 72 hours. Anemia Panel: Recent Labs    06/14/20 0432 06/15/20 0400  FERRITIN 681* 952*   Sepsis Labs: Recent Labs  Lab 06/07/2020 2213 06/13/20 0636  PROCALCITON  --  14.51  LATICACIDVEN 1.7  --     Recent Results (from the past 240 hour(s))  Culture, blood (Routine x 2)     Status: None (Preliminary result)   Collection Time: 05/24/2020 10:13 PM   Specimen: BLOOD  Result Value Ref Range Status   Specimen Description BLOOD BLOOD RIGHT FOREARM  Final   Special Requests   Final    BOTTLES DRAWN AEROBIC AND ANAEROBIC Blood Culture adequate volume   Culture   Final    NO GROWTH 2 DAYS Performed at [facility], [address], [place], [place] [address]    Report Status PENDING  Incomplete  Culture, blood (Routine x 2)     Status: None (Preliminary result)   Collection Time: 05/25/2020 10:30 PM   Specimen: BLOOD  Result Value Ref Range Status   Specimen Description   Final    BLOOD BLOOD LEFT ARM Performed at [facility], [address], [place], [place] [address]    Special Requests   Final    BOTTLES DRAWN AEROBIC AND ANAEROBIC Blood Culture results may not be optimal due to an inadequate volume of blood received in culture  bottles Performed at [facility], [address] [place], [place] [address]    Culture  Setup Time   Final    GRAM POSITIVE COCCI AEROBIC BOTTLE ONLY CRITICAL RESULT CALLED TO, READ BACK BY AND VERIFIED WITH: NATHAN BLEUE @0237  ON 06/14/20 SKL Performed at [facility], [place] [address], [place], [place] [address]    Culture [facility] POSITIVE COCCI  Final   Report Status PENDING  Incomplete  Blood Culture ID Panel (Reflexed)  Status: Abnormal   Collection Time: 26-Jun-2020 10:30 PM  Result Value Ref Range Status   Enterococcus faecalis NOT DETECTED NOT DETECTED Final   Enterococcus Faecium NOT DETECTED NOT DETECTED Final   Listeria monocytogenes NOT DETECTED NOT DETECTED Final   Staphylococcus species DETECTED (A) NOT DETECTED Final    Comment: CRITICAL RESULT CALLED TO, READ BACK BY AND VERIFIED WITH: NATHAN BELUE @0237  ON 06/14/20 SKL    Staphylococcus aureus (BCID) NOT DETECTED NOT DETECTED Final   Staphylococcus epidermidis DETECTED (A) NOT DETECTED Final    Comment: CRITICAL RESULT CALLED TO, READ BACK BY AND VERIFIED WITH: NATHAN BELUE @0237  ON 06/14/20 SKL    Staphylococcus lugdunensis NOT DETECTED NOT DETECTED Final   Streptococcus species NOT DETECTED NOT DETECTED Final   Streptococcus agalactiae NOT DETECTED NOT DETECTED Final   Streptococcus pneumoniae NOT DETECTED NOT DETECTED Final   Streptococcus pyogenes NOT DETECTED NOT DETECTED Final   A.calcoaceticus-baumannii NOT DETECTED NOT DETECTED Final   Bacteroides fragilis NOT DETECTED NOT DETECTED Final   Enterobacterales NOT DETECTED NOT DETECTED Final   Enterobacter cloacae complex NOT DETECTED NOT DETECTED Final   Escherichia coli NOT DETECTED NOT DETECTED Final   Klebsiella aerogenes NOT DETECTED NOT DETECTED Final   Klebsiella oxytoca NOT DETECTED NOT DETECTED Final   Klebsiella pneumoniae NOT DETECTED NOT DETECTED Final   Proteus species NOT DETECTED NOT DETECTED Final   Salmonella species NOT  DETECTED NOT DETECTED Final   Serratia marcescens NOT DETECTED NOT DETECTED Final   Haemophilus influenzae NOT DETECTED NOT DETECTED Final   Neisseria meningitidis NOT DETECTED NOT DETECTED Final   Pseudomonas aeruginosa NOT DETECTED NOT DETECTED Final   Stenotrophomonas maltophilia NOT DETECTED NOT DETECTED Final   Candida albicans NOT DETECTED NOT DETECTED Final   Candida auris NOT DETECTED NOT DETECTED Final   Candida glabrata NOT DETECTED NOT DETECTED Final   Candida krusei NOT DETECTED NOT DETECTED Final   Candida parapsilosis NOT DETECTED NOT DETECTED Final   Candida tropicalis NOT DETECTED NOT DETECTED Final   Cryptococcus neoformans/gattii NOT DETECTED NOT DETECTED Final   Methicillin resistance mecA/C NOT DETECTED NOT DETECTED Final    Comment: Performed at [facility], [address] [place], [address] [place]  Urine culture     Status: Abnormal   Collection Time: Jun 26, 2020 10:37 PM   Specimen: Urine, Random  Result Value Ref Range Status   Specimen Description   Final    URINE, RANDOM Performed at [facility], [address] [place], [address] [place]    Special Requests   Final    NONE Performed at [facility], [address] [place], [address] [place]    Culture >=100,000 COLONIES/mL STAPHYLOCOCCUS EPIDERMIDIS (A)  Final   Report Status 06/15/2020 FINAL  Final   Organism ID, Bacteria STAPHYLOCOCCUS EPIDERMIDIS (A)  Final      Susceptibility   Staphylococcus epidermidis - MIC*    CIPROFLOXACIN >=8 RESISTANT Resistant     GENTAMICIN <=0.5 SENSITIVE Sensitive     NITROFURANTOIN 32 SENSITIVE Sensitive     OXACILLIN <=0.25 SENSITIVE Sensitive     TETRACYCLINE <=1 SENSITIVE Sensitive     VANCOMYCIN <=0.5 SENSITIVE Sensitive     TRIMETH/SULFA <=10 SENSITIVE Sensitive     CLINDAMYCIN <=0.25 SENSITIVE Sensitive     RIFAMPIN <=0.5 SENSITIVE Sensitive     Inducible Clindamycin NEGATIVE Sensitive     * >=100,000 COLONIES/mL STAPHYLOCOCCUS  EPIDERMIDIS  Resp Panel by RT-PCR (Flu A&B, Covid) Nasopharyngeal Swab  Status: Abnormal   Collection Time: 06/13/20  9:18 AM   Specimen: Nasopharyngeal Swab; Nasopharyngeal(NP) swabs in vial transport medium  Result Value Ref Range Status   SARS Coronavirus 2 by RT PCR POSITIVE (A) NEGATIVE Final    Comment: RESULT CALLED TO, READ BACK BY AND VERIFIED WITH: ANGELA ROBBINS 06/13/20 AT 1103 BY ACR (NOTE) SARS-CoV-2 target nucleic acids are DETECTED.  The SARS-CoV-2 RNA is generally detectable in upper respiratory specimens during the acute phase of infection. Positive results are indicative of the presence of the identified virus, but do not rule out bacterial infection or co-infection with other pathogens not detected by the test. Clinical correlation with patient history and other diagnostic information is necessary to determine patient infection status. The expected result is Negative.  Fact Sheet for Patients: EntrepreneurPulse.com.au  Fact Sheet for Healthcare Providers: IncredibleEmployment.be  This test is not yet approved or cleared by the [place] FDA and  has been authorized for detection and/or diagnosis of SARS-CoV-2 by FDA under an Emergency Use Authorization (EUA).  This EUA will remain in effect (meaning this test can  be used) for the duration of  the COVID-19 declaration under Section 564(b)(1) of the Act, 21 U.S.C. section 360bbb-3(b)(1), unless the authorization is terminated or revoked sooner.     Influenza A by PCR NEGATIVE NEGATIVE Final   Influenza B by PCR NEGATIVE NEGATIVE Final    Comment: (NOTE) The Xpert Xpress SARS-CoV-2/FLU/RSV plus assay is intended as an aid in the diagnosis of influenza from Nasopharyngeal swab specimens and should not be used as a sole basis for treatment. Nasal washings and aspirates are unacceptable for Xpert Xpress SARS-CoV-2/FLU/RSV testing.  Fact Sheet for  Patients: EntrepreneurPulse.com.au  Fact Sheet for Healthcare Providers: IncredibleEmployment.be  This test is not yet approved or cleared by the [place] FDA and has been authorized for detection and/or diagnosis of SARS-CoV-2 by FDA under an Emergency Use Authorization (EUA). This EUA will remain in effect (meaning this test can be used) for the duration of the COVID-19 declaration under Section 564(b)(1) of the Act, 21 U.S.C. section 360bbb-3(b)(1), unless the authorization is terminated or revoked.  Performed at [facility] [address] [place], [place] [address]          Radiology Studies: CT ANGIO CHEST PE W OR WO CONTRAST  Result Date: 06/15/2020 CLINICAL DATA:  COVID pneumonia, elevated D-dimer EXAM: CT ANGIOGRAPHY CHEST WITH CONTRAST TECHNIQUE: Multidetector CT imaging of the chest was performed using the standard protocol during bolus administration of intravenous contrast. Multiplanar CT image reconstructions and MIPs were obtained to evaluate the vascular anatomy. CONTRAST:  85mL OMNIPAQUE IOHEXOL 350 MG/ML SOLN COMPARISON:  None. FINDINGS: Cardiovascular: There is adequate opacification of the pulmonary arterial tree. No intraluminal filling defect identified to suggest acute pulmonary embolism. The central pulmonary arteries are enlarged in keeping with changes of pulmonary arterial hypertension. No significant coronary artery calcification. Global cardiac size within normal limits. Small fluid noted within the superior pericardial recess. Mild atherosclerotic calcification noted within the thoracic aorta. No aortic aneurysm. Mediastinum/Nodes: There is shotty right hilar, subcarinal, and right paratracheal adenopathy, likely reactive in nature. Esophagus is unremarkable. Thyroid unremarkable. Small hiatal hernia. Lungs/Pleura: There is extensive, asymmetric, diffuse ground-glass pulmonary infiltrate and pulmonary  consolidation, likely infectious or inflammatory in nature. No pneumothorax. Trace left and small right pleural effusions are present with associated right basilar compressive atelectasis. Central airways are widely patent. Upper Abdomen: Mild bilateral perinephric stranding is partially visualized though the kidneys  are largely excluded from view. No acute abnormality identified. Musculoskeletal: No acute bone abnormality. Review of the MIP images confirms the above findings. IMPRESSION: No pulmonary embolism. Extensive multifocal ground-glass pulmonary infiltrate and pulmonary consolidation with associated small bilateral pleural effusions. The findings are in keeping with atypical infection in the acute setting and, while not pathognomonic of COVID-19 pneumonia, can be seen the setting of acute COVID infection. Aortic Atherosclerosis (ICD10-I70.0). Electronically Signed   By: Fidela Salisbury MD   On: 06/15/2020 00:25        Scheduled Meds: . dexamethasone (DECADRON) injection  6 mg Intravenous Q24H  . enoxaparin (LOVENOX) injection  40 mg Subcutaneous Q24H  . finasteride  5 mg Oral Daily  . levETIRAcetam  750 mg Oral BID  . phenytoin  300 mg Oral QHS  . tamsulosin  0.4 mg Oral Daily   Continuous Infusions: . clindamycin (CLEOCIN) IV       LOS: 2 days    Time spent: 31 mins     Wyvonnia Dusky, MD [facility] Pager [phone]  If 7PM-7AM, please contact night-coverage 06/15/2020, 7:51 AM

## 2020-06-15 NOTE — Progress Notes (Signed)
The patient has a bed assigned to be transfer to progressive care. His oxygen requirements have been increasing. Continues to spike fevers at night time. Antibiotics have been adjusted per MD. Continues to be on 12 liter of HFNC.

## 2020-06-15 NOTE — Consult Note (Incomplete)
Remdesivir - Pharmacy Brief Note   O:  ALT: 83 CXR: *** SpO2: ***% on ***   A/P:  Remdesivir 200 mg IVPB once followed by 100 mg IVPB daily x 4 days.   .me 06/15/2020 8:22 AM

## 2020-06-15 NOTE — Progress Notes (Signed)
Currently on 10 liters of high flow nasal cannula. O2 sat is 92%.

## 2020-06-16 ENCOUNTER — Inpatient Hospital Stay: Payer: Medicare Other

## 2020-06-16 DIAGNOSIS — N390 Urinary tract infection, site not specified: Secondary | ICD-10-CM | POA: Diagnosis not present

## 2020-06-16 DIAGNOSIS — A419 Sepsis, unspecified organism: Secondary | ICD-10-CM | POA: Diagnosis not present

## 2020-06-16 DIAGNOSIS — U071 COVID-19: Secondary | ICD-10-CM | POA: Diagnosis not present

## 2020-06-16 DIAGNOSIS — J9601 Acute respiratory failure with hypoxia: Secondary | ICD-10-CM | POA: Diagnosis not present

## 2020-06-16 LAB — COMPREHENSIVE METABOLIC PANEL
ALT: 75 U/L — ABNORMAL HIGH (ref 0–44)
AST: 181 U/L — ABNORMAL HIGH (ref 15–41)
Albumin: 2.1 g/dL — ABNORMAL LOW (ref 3.5–5.0)
Alkaline Phosphatase: 35 U/L — ABNORMAL LOW (ref 38–126)
Anion gap: 12 (ref 5–15)
BUN: 18 mg/dL (ref 8–23)
CO2: 20 mmol/L — ABNORMAL LOW (ref 22–32)
Calcium: 8.3 mg/dL — ABNORMAL LOW (ref 8.9–10.3)
Chloride: 100 mmol/L (ref 98–111)
Creatinine, Ser: 0.9 mg/dL (ref 0.61–1.24)
GFR, Estimated: >60 mL/min (ref >60)
Glucose, Bld: 107 mg/dL — ABNORMAL HIGH (ref 70–99)
Potassium: 3.4 mmol/L — ABNORMAL LOW (ref 3.5–5.1)
Sodium: 132 mmol/L — ABNORMAL LOW (ref 135–145)
Total Bilirubin: 0.8 mg/dL (ref 0.3–1.2)
Total Protein: 5.6 g/dL — ABNORMAL LOW (ref 6.5–8.1)

## 2020-06-16 LAB — FERRITIN: Ferritin: 1025 ng/mL — ABNORMAL HIGH (ref 24–336)

## 2020-06-16 LAB — CBC
HCT: 36.2 % — ABNORMAL LOW (ref 39.0–52.0)
Hemoglobin: 13 g/dL (ref 13.0–17.0)
MCH: 33.2 pg (ref 26.0–34.0)
MCHC: 35.9 g/dL (ref 30.0–36.0)
MCV: 92.6 fL (ref 80.0–100.0)
Platelets: 326 10*3/uL (ref 150–400)
RBC: 3.91 MIL/uL — ABNORMAL LOW (ref 4.22–5.81)
RDW: 12.2 % (ref 11.5–15.5)
WBC: 9.4 10*3/uL (ref 4.0–10.5)
nRBC: 0 % (ref 0.0–0.2)

## 2020-06-16 LAB — TRIGLYCERIDES: Triglycerides: 86 mg/dL (ref <150)

## 2020-06-16 LAB — D-DIMER, QUANTITATIVE: D-Dimer, Quant: 1.74 ug/mL-FEU — ABNORMAL HIGH (ref 0.00–0.50)

## 2020-06-16 LAB — MRSA PCR SCREENING: MRSA by PCR: NEGATIVE

## 2020-06-16 LAB — C-REACTIVE PROTEIN: CRP: 15.4 mg/dL — ABNORMAL HIGH (ref <1.0)

## 2020-06-16 MED ORDER — MIDAZOLAM HCL 2 MG/2ML IJ SOLN
INTRAMUSCULAR | Status: AC
  Administered 2020-06-16: 2 mg
  Filled 2020-06-16: qty 4

## 2020-06-16 MED ORDER — FENTANYL 2500MCG IN NS 250ML (10MCG/ML) PREMIX INFUSION
0.0000 ug/h | INTRAVENOUS | Status: DC
  Administered 2020-06-17 (×2): 150 ug/h via INTRAVENOUS
  Administered 2020-06-18 – 2020-06-19 (×3): 175 ug/h via INTRAVENOUS
  Administered 2020-06-20: 150 ug/h via INTRAVENOUS
  Filled 2020-06-16 (×6): qty 250

## 2020-06-16 MED ORDER — STERILE WATER FOR INJECTION IJ SOLN
INTRAMUSCULAR | Status: AC
  Filled 2020-06-16: qty 10

## 2020-06-16 MED ORDER — IBUPROFEN 400 MG PO TABS: 400.0000 mg | ORAL_TABLET | ORAL | Status: DC | PRN

## 2020-06-16 MED ORDER — MORPHINE SULFATE (PF) 2 MG/ML IV SOLN
1.0000 mg | INTRAVENOUS | Status: DC | PRN
  Administered 2020-06-16: 1 mg via INTRAVENOUS
  Filled 2020-06-16: qty 1

## 2020-06-16 MED ORDER — FENTANYL BOLUS VIA INFUSION
25.0000 ug | INTRAVENOUS | Status: DC | PRN
Start: 2020-06-16 — End: 2020-06-20
  Filled 2020-06-16: qty 25

## 2020-06-16 MED ORDER — CHLORHEXIDINE GLUCONATE CLOTH 2 % EX PADS
6.0000 | MEDICATED_PAD | Freq: Every day | CUTANEOUS | Status: DC
  Administered 2020-06-17 – 2020-06-19 (×3): 6 via TOPICAL

## 2020-06-16 MED ORDER — BARICITINIB 2 MG PO TABS
4.0000 mg | ORAL_TABLET | Freq: Every day | ORAL | Status: DC
  Filled 2020-06-16: qty 2

## 2020-06-16 MED ORDER — PROPOFOL 1000 MG/100ML IV EMUL
INTRAVENOUS | Status: AC
  Filled 2020-06-16: qty 100

## 2020-06-16 MED ORDER — ROCURONIUM BROMIDE 50 MG/5ML IV SOLN
INTRAVENOUS | Status: AC
  Administered 2020-06-16: 10 mg
  Filled 2020-06-16: qty 1

## 2020-06-16 MED ORDER — DOCUSATE SODIUM 50 MG/5ML PO LIQD
100.0000 mg | Freq: Two times a day (BID) | ORAL | Status: DC
  Administered 2020-06-18 – 2020-06-19 (×2): 100 mg
  Filled 2020-06-16 (×3): qty 10

## 2020-06-16 MED ORDER — FENTANYL CITRATE (PF) 100 MCG/2ML IJ SOLN
INTRAMUSCULAR | Status: AC
  Administered 2020-06-16: 100 ug
  Filled 2020-06-16: qty 4

## 2020-06-16 MED ORDER — MIDAZOLAM HCL 2 MG/2ML IJ SOLN: 1.0000 mg | INTRAMUSCULAR | Status: DC | PRN

## 2020-06-16 MED ORDER — VECURONIUM BROMIDE 10 MG IV SOLR
10.0000 mg | INTRAVENOUS | Status: DC | PRN
  Administered 2020-06-16 – 2020-06-20 (×2): 10 mg via INTRAVENOUS
  Filled 2020-06-16 (×2): qty 10

## 2020-06-16 MED ORDER — SODIUM CHLORIDE 0.9 % IV SOLN
750.0000 mg | Freq: Two times a day (BID) | INTRAVENOUS | Status: DC
  Administered 2020-06-16 – 2020-06-17 (×2): 750 mg via INTRAVENOUS
  Filled 2020-06-16 (×5): qty 7.5

## 2020-06-16 MED ORDER — FAMOTIDINE IN NACL 20-0.9 MG/50ML-% IV SOLN
20.0000 mg | Freq: Two times a day (BID) | INTRAVENOUS | Status: DC
  Administered 2020-06-17 – 2020-06-18 (×4): 20 mg via INTRAVENOUS
  Filled 2020-06-16 (×6): qty 50

## 2020-06-16 MED ORDER — FENTANYL 2500MCG IN NS 250ML (10MCG/ML) PREMIX INFUSION
INTRAVENOUS | Status: AC
  Administered 2020-06-16: 50 ug/h via INTRAVENOUS
  Filled 2020-06-16: qty 250

## 2020-06-16 MED ORDER — ACETAMINOPHEN 10 MG/ML IV SOLN
500.0000 mg | Freq: Four times a day (QID) | INTRAVENOUS | Status: AC | PRN
  Filled 2020-06-16: qty 50

## 2020-06-16 MED ORDER — PROPOFOL 1000 MG/100ML IV EMUL
5.0000 ug/kg/min | INTRAVENOUS | Status: DC
  Administered 2020-06-16 – 2020-06-17 (×5): 35 ug/kg/min via INTRAVENOUS
  Administered 2020-06-18 (×2): 45 ug/kg/min via INTRAVENOUS
  Administered 2020-06-18: 55 ug/kg/min via INTRAVENOUS
  Administered 2020-06-18 (×2): 45 ug/kg/min via INTRAVENOUS
  Administered 2020-06-18: 35 ug/kg/min via INTRAVENOUS
  Administered 2020-06-18: 45 ug/kg/min via INTRAVENOUS
  Administered 2020-06-19 (×3): 55 ug/kg/min via INTRAVENOUS
  Administered 2020-06-19: 40 ug/kg/min via INTRAVENOUS
  Administered 2020-06-19: 55 ug/kg/min via INTRAVENOUS
  Administered 2020-06-19: 45 ug/kg/min via INTRAVENOUS
  Administered 2020-06-20 (×3): 50 ug/kg/min via INTRAVENOUS
  Filled 2020-06-16 (×20): qty 100

## 2020-06-16 MED ORDER — POTASSIUM CHLORIDE 10 MEQ/100ML IV SOLN
10.0000 meq | INTRAVENOUS | Status: AC
  Administered 2020-06-16 (×2): 10 meq via INTRAVENOUS
  Filled 2020-06-16 (×2): qty 100

## 2020-06-16 MED ORDER — POLYETHYLENE GLYCOL 3350 17 G PO PACK
17.0000 g | PACK | Freq: Every day | ORAL | Status: DC
  Administered 2020-06-19: 17 g
  Filled 2020-06-16 (×2): qty 1

## 2020-06-16 NOTE — Progress Notes (Addendum)
Update 0155: pt temp at 103.3 oral. Pt was administered tylenol suppository (0201) due to pt needing to be on CPAP continuously. Cold compress was applied on bilateral armpit and groin rea. Will continue to monitor.  Update 0320: Pt temp at 101.1 from 103.3 after tylenol adminstration. Notify MD Mansy. Will continue to monitor.  Update 0441: Dr. Arville Care ordered acetaminophen (OFIRMEV) IV 500 mg every 6 hours PRN and Ibuprofen (ADVIL) tablet 400 mg every 4 hours PRN for fever >100.9, pain and headache.  Initially pt was on HFNC 15 liters and did took pills safely with water. Pt was place on CPAP for higher oxygen demand and when pt requested something to drink staff took the pt off for good 1 minute and pt started to cough while sipping water. Pt made aware MD notified and ordered IV tylenol 500 mg.  Will continue to monitor.  Update 0510: Pt temp at 100.0 oral. Pt PRN medicines was not given. Pt temp not within the parameter.Will continue to monitor.  Update 0531: Pt temp at 99.1 at 0531. Will continue to monitor.

## 2020-06-16 NOTE — Progress Notes (Signed)
PROGRESS NOTE    Dylan Sullivan  [id number] DOB: [birth year] DOA: 06/02/2020 PCP: Casilda Carls, MD    Assessment & Plan:   Principal Problem:   Sepsis secondary to UTI [facility]) Active Problems:   Essential (primary) hypertension   BPH without obstruction/lower urinary tract symptoms   OSA on CPAP   Fall at home, initial encounter   Epilepsy [facility])   Complicated UTI (urinary tract infection)   Sepsis: meets criteria with fever, tachycardia, tachypnea & likely UTI. Procal is 14.51. Continue on IV cefazolin. Fevers likely secondary to [place]. Blood cxs show likely containment, repeat blood cxs NGTD  UTI: UA is positive. Urine cx is growing stap epidermidis. Continue on IV cefazolin   COVID19 pneumonia: worse today, will start baricitinib. Continue on airborne and contact precautions. CXR was neg for pneumonia but CT chest showed pneumonia. Continue on IV remdesivir, steroids, & bronchodilators    Acute hypoxic respiratory failure: 84% on 4L [facility]. Continue on BiPAP and wean as tolerated.   Hx of cerebral palsy: continue w/ supportive care. 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 Lives alone and unknown if pt has family. Called pt's friend Darene Lamer but no answer   Hyponatremia: labile. Will continue to monitor   Hypokalemia: KCl repleted. Will continue to monitor   Transaminitis: likely secondary to [place]  BPH: continue on tamsulosin, finasteride   Fall: PT/OT recs SNF. Pt lives alone. Unknown if pt has family or not   OSA: continue on CPAP qhs  Epilepsy: continue on home dose of keppra, phenytoin.     DVT prophylaxis: lovenox  Code Status: full  Family Communication: called pt's friend Darene Lamer but no answer. Unknown if pt has family or not  Disposition Plan: likely d/c to SNF   Status is: Inpatient  Remains inpatient appropriate because:Ongoing diagnostic testing needed not appropriate for outpatient work up, Unsafe d/c plan, IV  treatments appropriate due to intensity of illness or inability to take PO and Inpatient level of care appropriate due to severity of illness   Dispo: The patient is from: Home              Anticipated d/c is to: SNF vs home health               Anticipated d/c date is: > 3 days              Patient currently is not medically stable to d/c.     Consultants:      Procedures:    Antimicrobials: cefazolin    Subjective: Pt c/o shortness of breath   Objective: Vitals:   06/16/20 0354 06/16/20 0500 06/16/20 0531 06/16/20 0645  BP: 132/70     Pulse: (!) 108     Resp: 20     Temp: (!) 101.5 F (38.6 C) 100 F (37.8 C) 99.1 F (37.3 C) 99.2 F (37.3 C)  TempSrc: Axillary Oral Oral Oral  SpO2: 95%     Weight: 80 kg     Height:        Intake/Output Summary (Last 24 hours) at 06/16/2020 0748 Last data filed at 06/16/2020 0020 Gross per 24 hour  Intake 697.5 ml  Output --  Net 697.5 ml   Filed Weights   05/28/2020 2208 06/16/20 0354  Weight: 81.6 kg 80 kg    Examination:  General exam: Appears calm but uncomfortable   Respiratory system: course breath sounds b/l  Cardiovascular system: tachycardia. No clicks or gallops  Gastrointestinal system:  Abd is soft, NT, ND & hypoactive bowel sounds  Central nervous system: Alert and awake  Psychiatry: Judgement and insight appear abnormal. Flat mood and affect    Data Reviewed: I have personally reviewed following labs and imaging studies  CBC: Recent Labs  Lab 05/28/2020 2213 06/13/20 0636 06/14/20 0432 06/15/20 0400 06/16/20 0515  WBC 10.0 5.8 7.5 7.2 9.4  NEUTROABS 8.8* 3.9  --   --   --   HGB 14.2 12.6* 13.1 13.0 13.0  HCT 41.3 36.5* 36.0* 36.3* 36.2*  MCV 96.5 96.3 92.8 93.1 92.6  PLT 253 192 254 286 [id number]   Basic Metabolic Panel: Recent Labs  Lab 06/13/20 0636 06/14/20 0432 06/15/20 0400 06/16/20 0515  NA 133* 133* 133* 132*  K 3.2* 3.5 3.7 3.4*  CL 101 100 101 100  CO2 23 20* 22 20*  GLUCOSE 98  103* 112* 107*  BUN 16 13 16 18   CREATININE 0.96 0.89 0.85 0.90  CALCIUM 8.4* 8.1* 7.9* 8.3*  MG 1.7  --   --   --    GFR: Estimated Creatinine Clearance: 69.7 mL/min (by C-G formula based on SCr of 0.9 mg/dL). Liver Function Tests: Recent Labs  Lab 06/13/20 0636 06/14/20 0432 06/15/20 0400 06/16/20 0515  AST 194* 193* 195* 181*  ALT 62* 75* 83* 75*  ALKPHOS 40 36* 40 35*  BILITOT 0.6 0.8 0.8 0.8  PROT 5.8* 5.9* 5.7* 5.6*  ALBUMIN 2.4* 2.3* 2.4* 2.1*   No results for input(s): LIPASE, AMYLASE in the last 168 hours. No results for input(s): AMMONIA in the last 168 hours. Coagulation Profile: Recent Labs  Lab 05/26/2020 2213  INR 1.0   Cardiac Enzymes: No results for input(s): CKTOTAL, CKMB, CKMBINDEX, TROPONINI in the last 168 hours. BNP (last 3 results) No results for input(s): PROBNP in the last 8760 hours. HbA1C: No results for input(s): HGBA1C in the last 72 hours. CBG: No results for input(s): GLUCAP in the last 168 hours. Lipid Profile: No results for input(s): CHOL, HDL, LDLCALC, TRIG, CHOLHDL, LDLDIRECT in the last 72 hours. Thyroid Function Tests: No results for input(s): TSH, T4TOTAL, FREET4, T3FREE, THYROIDAB in the last 72 hours. Anemia Panel: Recent Labs    06/15/20 0400 06/16/20 0515  FERRITIN 952* 1,025*   Sepsis Labs: Recent Labs  Lab 06/05/2020 2213 06/13/20 0636  PROCALCITON  --  14.51  LATICACIDVEN 1.7  --     Recent Results (from the past 240 hour(s))  Culture, blood (Routine x 2)     Status: None (Preliminary result)   Collection Time: 06/04/2020 10:13 PM   Specimen: BLOOD  Result Value Ref Range Status   Specimen Description BLOOD BLOOD RIGHT FOREARM  Final   Special Requests   Final    BOTTLES DRAWN AEROBIC AND ANAEROBIC Blood Culture adequate volume   Culture   Final    NO GROWTH 4 DAYS Performed at [facility], [address], [place], [place] [address]    Report Status PENDING  Incomplete  Culture, blood (Routine x  2)     Status: Abnormal   Collection Time: 05/24/2020 10:30 PM   Specimen: BLOOD  Result Value Ref Range Status   Specimen Description   Final    BLOOD BLOOD LEFT ARM Performed at [facility], [address] [place], [place] [address]    Special Requests   Final    BOTTLES DRAWN AEROBIC AND ANAEROBIC Blood Culture results may not be optimal due to an inadequate volume of blood  received in culture bottles Performed at [facility], [address] [place], [place] [address]    Culture  Setup Time   Final    GRAM POSITIVE COCCI AEROBIC BOTTLE ONLY CRITICAL RESULT CALLED TO, READ BACK BY AND VERIFIED WITH: NATHAN BLEUE @0237  ON 06/14/20 SKL    Culture (A)  Final    STAPHYLOCOCCUS EPIDERMIDIS THE SIGNIFICANCE OF ISOLATING THIS ORGANISM FROM A SINGLE SET OF BLOOD CULTURES WHEN MULTIPLE SETS ARE DRAWN IS UNCERTAIN. PLEASE NOTIFY THE MICROBIOLOGY DEPARTMENT WITHIN ONE WEEK IF SPECIATION AND SENSITIVITIES ARE REQUIRED. Performed at [facility], [address], [place], [place]    Report Status 06/15/2020 FINAL  Final  Blood Culture ID Panel (Reflexed)     Status: Abnormal   Collection Time: 06/08/2020 10:30 PM  Result Value Ref Range Status   Enterococcus faecalis NOT DETECTED NOT DETECTED Final   Enterococcus Faecium NOT DETECTED NOT DETECTED Final   Listeria monocytogenes NOT DETECTED NOT DETECTED Final   Staphylococcus species DETECTED (A) NOT DETECTED Final    Comment: CRITICAL RESULT CALLED TO, READ BACK BY AND VERIFIED WITH: NATHAN BELUE @0237  ON 06/14/20 SKL    Staphylococcus aureus (BCID) NOT DETECTED NOT DETECTED Final   Staphylococcus epidermidis DETECTED (A) NOT DETECTED Final    Comment: CRITICAL RESULT CALLED TO, READ BACK BY AND VERIFIED WITH: NATHAN BELUE @0237  ON 06/14/20 SKL    Staphylococcus lugdunensis NOT DETECTED NOT DETECTED Final   Streptococcus species NOT DETECTED NOT DETECTED Final   Streptococcus agalactiae NOT DETECTED  NOT DETECTED Final   Streptococcus pneumoniae NOT DETECTED NOT DETECTED Final   Streptococcus pyogenes NOT DETECTED NOT DETECTED Final   A.calcoaceticus-baumannii NOT DETECTED NOT DETECTED Final   Bacteroides fragilis NOT DETECTED NOT DETECTED Final   Enterobacterales NOT DETECTED NOT DETECTED Final   Enterobacter cloacae complex NOT DETECTED NOT DETECTED Final   Escherichia coli NOT DETECTED NOT DETECTED Final   Klebsiella aerogenes NOT DETECTED NOT DETECTED Final   Klebsiella oxytoca NOT DETECTED NOT DETECTED Final   Klebsiella pneumoniae NOT DETECTED NOT DETECTED Final   Proteus species NOT DETECTED NOT DETECTED Final   Salmonella species NOT DETECTED NOT DETECTED Final   Serratia marcescens NOT DETECTED NOT DETECTED Final   Haemophilus influenzae NOT DETECTED NOT DETECTED Final   Neisseria meningitidis NOT DETECTED NOT DETECTED Final   Pseudomonas aeruginosa NOT DETECTED NOT DETECTED Final   Stenotrophomonas maltophilia NOT DETECTED NOT DETECTED Final   Candida albicans NOT DETECTED NOT DETECTED Final   Candida auris NOT DETECTED NOT DETECTED Final   Candida glabrata NOT DETECTED NOT DETECTED Final   Candida krusei NOT DETECTED NOT DETECTED Final   Candida parapsilosis NOT DETECTED NOT DETECTED Final   Candida tropicalis NOT DETECTED NOT DETECTED Final   Cryptococcus neoformans/gattii NOT DETECTED NOT DETECTED Final   Methicillin resistance mecA/C NOT DETECTED NOT DETECTED Final    Comment: Performed at [facility], [address] [place], [facility] [address]  Urine culture     Status: Abnormal   Collection Time: 06/09/2020 10:37 PM   Specimen: Urine, Random  Result Value Ref Range Status   Specimen Description   Final    URINE, RANDOM Performed at [facility], [address] [place], [facility] [address]    Special Requests   Final    NONE Performed at [facility], [address] [place], [facility] [address]    Culture >=100,000 COLONIES/mL  STAPHYLOCOCCUS EPIDERMIDIS (A)  Final   Report  Status 06/15/2020 FINAL  Final   Organism ID, Bacteria STAPHYLOCOCCUS EPIDERMIDIS (A)  Final      Susceptibility   Staphylococcus epidermidis - MIC*    CIPROFLOXACIN >=8 RESISTANT Resistant     GENTAMICIN <=0.5 SENSITIVE Sensitive     NITROFURANTOIN 32 SENSITIVE Sensitive     OXACILLIN <=0.25 SENSITIVE Sensitive     TETRACYCLINE <=1 SENSITIVE Sensitive     VANCOMYCIN <=0.5 SENSITIVE Sensitive     TRIMETH/SULFA <=10 SENSITIVE Sensitive     CLINDAMYCIN <=0.25 SENSITIVE Sensitive     RIFAMPIN <=0.5 SENSITIVE Sensitive     Inducible Clindamycin NEGATIVE Sensitive     * >=100,000 COLONIES/mL STAPHYLOCOCCUS EPIDERMIDIS  Resp Panel by RT-PCR (Flu A&B, Covid) Nasopharyngeal Swab     Status: Abnormal   Collection Time: 06/13/20  9:18 AM   Specimen: Nasopharyngeal Swab; Nasopharyngeal(NP) swabs in vial transport medium  Result Value Ref Range Status   SARS Coronavirus 2 by RT PCR POSITIVE (A) NEGATIVE Final    Comment: RESULT CALLED TO, READ BACK BY AND VERIFIED WITH: ANGELA ROBBINS 06/13/20 AT 1103 BY ACR (NOTE) SARS-CoV-2 target nucleic acids are DETECTED.  The SARS-CoV-2 RNA is generally detectable in upper respiratory specimens during the acute phase of infection. Positive results are indicative of the presence of the identified virus, but do not rule out bacterial infection or co-infection with other pathogens not detected by the test. Clinical correlation with patient history and other diagnostic information is necessary to determine patient infection status. The expected result is Negative.  Fact Sheet for Patients: EntrepreneurPulse.com.au  Fact Sheet for Healthcare Providers: IncredibleEmployment.be  This test is not yet approved or cleared by the [place] FDA and  has been authorized for detection and/or diagnosis of SARS-CoV-2 by FDA under an Emergency Use Authorization (EUA).  This EUA  will remain in effect (meaning this test can  be used) for the duration of  the COVID-19 declaration under Section 564(b)(1) of the Act, 21 U.S.C. section 360bbb-3(b)(1), unless the authorization is terminated or revoked sooner.     Influenza A by PCR NEGATIVE NEGATIVE Final   Influenza B by PCR NEGATIVE NEGATIVE Final    Comment: (NOTE) The Xpert Xpress SARS-CoV-2/FLU/RSV plus assay is intended as an aid in the diagnosis of influenza from Nasopharyngeal swab specimens and should not be used as a sole basis for treatment. Nasal washings and aspirates are unacceptable for Xpert Xpress SARS-CoV-2/FLU/RSV testing.  Fact Sheet for Patients: EntrepreneurPulse.com.au  Fact Sheet for Healthcare Providers: IncredibleEmployment.be  This test is not yet approved or cleared by the [place] FDA and has been authorized for detection and/or diagnosis of SARS-CoV-2 by FDA under an Emergency Use Authorization (EUA). This EUA will remain in effect (meaning this test can be used) for the duration of the COVID-19 declaration under Section 564(b)(1) of the Act, 21 U.S.C. section 360bbb-3(b)(1), unless the authorization is terminated or revoked.  Performed at [facility], [address] [place], [place] [address]   CULTURE, BLOOD (ROUTINE X 2) w Reflex to ID Panel     Status: None (Preliminary result)   Collection Time: 06/14/20  8:37 AM   Specimen: BLOOD  Result Value Ref Range Status   Specimen Description BLOOD LEFT HAND  Final   Special Requests   Final    BOTTLES DRAWN AEROBIC AND ANAEROBIC Blood Culture adequate volume   Culture   Final    NO GROWTH 2 DAYS Performed at [facility], [address], [place], [place] [address]  Report Status PENDING  Incomplete  CULTURE, BLOOD (ROUTINE X 2) w Reflex to ID Panel     Status: None (Preliminary result)   Collection Time: 06/14/20  8:38 AM   Specimen: BLOOD  Result Value Ref  Range Status   Specimen Description BLOOD LEFT HAND  Final   Special Requests   Final    BOTTLES DRAWN AEROBIC AND ANAEROBIC Blood Culture adequate volume   Culture   Final    NO GROWTH 2 DAYS Performed at [facility], [address] [place], [place] [address]    Report Status PENDING  Incomplete         Radiology Studies: CT ANGIO CHEST PE W OR WO CONTRAST  Result Date: 06/15/2020 CLINICAL DATA:  COVID pneumonia, elevated D-dimer EXAM: CT ANGIOGRAPHY CHEST WITH CONTRAST TECHNIQUE: Multidetector CT imaging of the chest was performed using the standard protocol during bolus administration of intravenous contrast. Multiplanar CT image reconstructions and MIPs were obtained to evaluate the vascular anatomy. CONTRAST:  34mL OMNIPAQUE IOHEXOL 350 MG/ML SOLN COMPARISON:  None. FINDINGS: Cardiovascular: There is adequate opacification of the pulmonary arterial tree. No intraluminal filling defect identified to suggest acute pulmonary embolism. The central pulmonary arteries are enlarged in keeping with changes of pulmonary arterial hypertension. No significant coronary artery calcification. Global cardiac size within normal limits. Small fluid noted within the superior pericardial recess. Mild atherosclerotic calcification noted within the thoracic aorta. No aortic aneurysm. Mediastinum/Nodes: There is shotty right hilar, subcarinal, and right paratracheal adenopathy, likely reactive in nature. Esophagus is unremarkable. Thyroid unremarkable. Small hiatal hernia. Lungs/Pleura: There is extensive, asymmetric, diffuse ground-glass pulmonary infiltrate and pulmonary consolidation, likely infectious or inflammatory in nature. No pneumothorax. Trace left and small right pleural effusions are present with associated right basilar compressive atelectasis. Central airways are widely patent. Upper Abdomen: Mild bilateral perinephric stranding is partially visualized though the kidneys are largely  excluded from view. No acute abnormality identified. Musculoskeletal: No acute bone abnormality. Review of the MIP images confirms the above findings. IMPRESSION: No pulmonary embolism. Extensive multifocal ground-glass pulmonary infiltrate and pulmonary consolidation with associated small bilateral pleural effusions. The findings are in keeping with atypical infection in the acute setting and, while not pathognomonic of COVID-19 pneumonia, can be seen the setting of acute COVID infection. Aortic Atherosclerosis (ICD10-I70.0). Electronically Signed   By: Fidela Salisbury MD   On: 06/15/2020 00:25        Scheduled Meds: . aspirin EC  81 mg Oral Daily  . dexamethasone (DECADRON) injection  6 mg Intravenous Q24H  . enoxaparin (LOVENOX) injection  40 mg Subcutaneous Q24H  . finasteride  5 mg Oral Daily  . levETIRAcetam  750 mg Oral BID  . phenytoin  300 mg Oral QHS  . tamsulosin  0.4 mg Oral Daily   Continuous Infusions: . sodium chloride 500 mL (06/16/20 0642)  . acetaminophen    .  ceFAZolin (ANCEF) IV 2 g (06/16/20 0644)  . remdesivir 100 mg in NS 100 mL       LOS: 3 days    Time spent: 30 mins     Wyvonnia Dusky, MD [facility] Pager [phone]  If 7PM-7AM, please contact night-coverage 06/16/2020, 7:48 AM

## 2020-06-16 NOTE — Progress Notes (Signed)
RN informed of pt with low SPO2 on 15L HFNC. Assessed pt, PT Spo2 77% on 15L HNC. Placed pt on HHFNC 55L 100% with a 15L NRB, Pt SPO2 88%. Pt has increased WOB, RR mid 30's. Placed pt on CPAP 10 with 100% O2, SPO2 now 94%.

## 2020-06-16 NOTE — Plan of Care (Signed)
  Problem: Education: Goal: Knowledge of General Education information will improve Description: Including pain rating scale, medication(s)/side effects and non-pharmacologic comfort measures Outcome: Progressing   Problem: Clinical Measurements: Goal: Will remain free from infection Outcome: Not Progressing Note: Pt temp at 103.3 at 0201 administered tylenol suppository   Problem: Clinical Measurements: Goal: Respiratory complications will improve Outcome: Progressing   Problem: Safety: Goal: Ability to remain free from injury will improve Outcome: Progressing

## 2020-06-16 NOTE — Progress Notes (Signed)
Pt HR 125, RR 38, O2 sat on CPAP @100 % 87%. Dr, notified.  1800- Metoprolol 5mg  IV given.  1810- Morphine 2mg  IV given.  1817- RR 40, HR 106, O2 sat on bipap 100% 90. Awaiting bed on step down.

## 2020-06-16 NOTE — Progress Notes (Signed)
PT Cancellation Note  Patient Details Name: Dylan Sullivan MRN: [id number] DOB: 07/01/1945   Cancelled Treatment:     PT attempt. PT hold. Pt currently requiring bi-pap. Per PT protocols, will hold. Will return when more appropriate to participate.    Rushie Chestnut 06/16/2020, 2:31 PM

## 2020-06-16 NOTE — Procedures (Signed)
Endotracheal Intubation: Patient required placement of an artificial airway secondary to Respiratory Failure  Consent: Emergent.   Hand washing performed prior to starting the procedure.   Medications administered for sedation prior to procedure:  Midazolam 2 mg IV,  rocuronium 40 mg IV, Fentanyl 100 mcg IV.    A time out procedure was called and correct patient, name, & ID confirmed. Needed supplies and equipment were assembled and checked to include ETT, 10 ml syringe, Glidescope, Mac and Miller blades, suction, oxygen and bag mask valve, end tidal CO2 monitor.   Patient was positioned to align the mouth and pharynx to facilitate visualization of the glottis.   Heart rate, SpO2 and blood pressure was continuously monitored during the procedure. Pre-oxygenation was conducted prior to intubation and endotracheal tube was placed through the vocal cords into the trachea.     The artificial airway was placed under direct visualization via glidescope route using a 8.0 ETT on the first attempt.  ETT was secured at 25 cm mark.  Placement was confirmed by auscuitation of lungs with good breath sounds bilaterally and no stomach sounds.  Condensation was noted on endotracheal tube.   Pulse ox 98%.  CO2 detector in place with appropriate color change.   Complications: None .    Chest radiograph ordered and pending.   Comments: OGT placed via glidescope.   Ottie Glazier, M.D.  [facility] [place]

## 2020-06-16 NOTE — Consult Note (Signed)
CRITICAL CARE  NOTE    Name: Mahmood Boehringer MRN: [id number] DOB: 08-12-1945     LOS: 3   SUBJECTIVE FINDINGS & SIGNIFICANT EVENTS    Patient description:   75yo M with PMH as listed below. He was admitted for febrile illness with signs of sepsis.  He was found to be acutely ill with COVID19.  While on medical floor he was profoundly hypoxemic on BIPAP with spO2<85% in acute respiratory distress tachypneic.     Patient is full code but not lucid enough to discuss goals of care.  I reached out to listed contact Maye Hides) went to voice mail left message.   Patient progressively deteriorating on arrival and was placed on MV.   Lines/tubes : External Urinary Catheter (Active)  Collection Container Dedicated Suction Canister 06/16/20 0853  Securement Method Securing device (Describe) 06/16/20 0853  Site Assessment Clean;Intact 06/16/20 0853  Intervention Equipment Changed 06/15/20 1358  Output (mL) 600 mL 06/15/20 0400    Microbiology/Sepsis markers: Results for orders placed or performed during the hospital encounter of 06/02/2020  Culture, blood (Routine x 2)     Status: None (Preliminary result)   Collection Time: 05/25/2020 10:13 PM   Specimen: BLOOD  Result Value Ref Range Status   Specimen Description BLOOD BLOOD RIGHT FOREARM  Final   Special Requests   Final    BOTTLES DRAWN AEROBIC AND ANAEROBIC Blood Culture adequate volume   Culture   Final    NO GROWTH 4 DAYS Performed at [facility], [address], [place], [place] [address]    Report Status PENDING  Incomplete  Culture, blood (Routine x 2)     Status: Abnormal   Collection Time: 05/20/2020 10:30 PM   Specimen: BLOOD  Result Value Ref Range Status   Specimen Description   Final    BLOOD BLOOD LEFT ARM Performed at [facility], [address], [place], [place] [address]    Special Requests   Final    BOTTLES DRAWN AEROBIC AND ANAEROBIC Blood Culture results may not be optimal due to an inadequate volume of blood received in culture bottles Performed at [facility], [address] [place], [place] [address]    Culture  Setup Time   Final    Jansen TO, READ BACK BY AND VERIFIED WITH: NATHAN BLEUE @0237  ON 06/14/20 SKL    Culture (A)  Final    STAPHYLOCOCCUS EPIDERMIDIS THE SIGNIFICANCE OF ISOLATING THIS ORGANISM FROM A SINGLE SET OF BLOOD CULTURES WHEN MULTIPLE SETS ARE DRAWN IS UNCERTAIN. PLEASE NOTIFY THE MICROBIOLOGY DEPARTMENT WITHIN ONE WEEK IF SPECIATION AND SENSITIVITIES ARE REQUIRED. Performed at [facility], [place] [address] [place], [place] [address]    Report Status 06/15/2020 FINAL  Final  Blood Culture ID Panel (Reflexed)     Status: Abnormal   Collection Time: 05/18/2020 10:30 PM  Result Value Ref Range Status   Enterococcus faecalis NOT DETECTED NOT DETECTED Final   Enterococcus Faecium NOT DETECTED NOT DETECTED Final   Listeria monocytogenes NOT DETECTED NOT DETECTED Final   Staphylococcus species DETECTED (A) NOT DETECTED Final    Comment: CRITICAL RESULT CALLED TO, READ BACK BY AND VERIFIED WITH: NATHAN BELUE @0237  ON 06/14/20 SKL    Staphylococcus aureus (BCID) NOT DETECTED NOT DETECTED Final   Staphylococcus epidermidis DETECTED (A) NOT DETECTED Final    Comment: CRITICAL RESULT CALLED TO, READ BACK BY AND VERIFIED WITH: NATHAN BELUE @0237   ON 06/14/20 SKL    Staphylococcus lugdunensis NOT DETECTED NOT DETECTED Final   Streptococcus species NOT DETECTED NOT DETECTED Final   Streptococcus agalactiae NOT DETECTED NOT DETECTED Final   Streptococcus pneumoniae NOT DETECTED NOT DETECTED Final   Streptococcus pyogenes NOT DETECTED NOT DETECTED Final   A.calcoaceticus-baumannii NOT DETECTED NOT DETECTED  Final   Bacteroides fragilis NOT DETECTED NOT DETECTED Final   Enterobacterales NOT DETECTED NOT DETECTED Final   Enterobacter cloacae complex NOT DETECTED NOT DETECTED Final   Escherichia coli NOT DETECTED NOT DETECTED Final   Klebsiella aerogenes NOT DETECTED NOT DETECTED Final   Klebsiella oxytoca NOT DETECTED NOT DETECTED Final   Klebsiella pneumoniae NOT DETECTED NOT DETECTED Final   Proteus species NOT DETECTED NOT DETECTED Final   Salmonella species NOT DETECTED NOT DETECTED Final   Serratia marcescens NOT DETECTED NOT DETECTED Final   Haemophilus influenzae NOT DETECTED NOT DETECTED Final   Neisseria meningitidis NOT DETECTED NOT DETECTED Final   Pseudomonas aeruginosa NOT DETECTED NOT DETECTED Final   Stenotrophomonas maltophilia NOT DETECTED NOT DETECTED Final   Candida albicans NOT DETECTED NOT DETECTED Final   Candida auris NOT DETECTED NOT DETECTED Final   Candida glabrata NOT DETECTED NOT DETECTED Final   Candida krusei NOT DETECTED NOT DETECTED Final   Candida parapsilosis NOT DETECTED NOT DETECTED Final   Candida tropicalis NOT DETECTED NOT DETECTED Final   Cryptococcus neoformans/gattii NOT DETECTED NOT DETECTED Final   Methicillin resistance mecA/C NOT DETECTED NOT DETECTED Final    Comment: Performed at [facility], [address] [place], [place] [address]  Urine culture     Status: Abnormal   Collection Time: 05/24/2020 10:37 PM   Specimen: Urine, Random  Result Value Ref Range Status   Specimen Description   Final    URINE, RANDOM Performed at [facility], [address] [place], [place] [address]    Special Requests   Final    NONE Performed at [facility], [address] [place], [place] [address]    Culture >=100,000 COLONIES/mL STAPHYLOCOCCUS EPIDERMIDIS (A)  Final   Report Status 06/15/2020 FINAL  Final   Organism ID, Bacteria STAPHYLOCOCCUS EPIDERMIDIS (A)  Final      Susceptibility   Staphylococcus epidermidis -  MIC*    CIPROFLOXACIN >=8 RESISTANT Resistant     GENTAMICIN <=0.5 SENSITIVE Sensitive     NITROFURANTOIN 32 SENSITIVE Sensitive     OXACILLIN <=0.25 SENSITIVE Sensitive     TETRACYCLINE <=1 SENSITIVE Sensitive     VANCOMYCIN <=0.5 SENSITIVE Sensitive     TRIMETH/SULFA <=10 SENSITIVE Sensitive     CLINDAMYCIN <=0.25 SENSITIVE Sensitive     RIFAMPIN <=0.5 SENSITIVE Sensitive     Inducible Clindamycin NEGATIVE Sensitive     * >=100,000 COLONIES/mL STAPHYLOCOCCUS EPIDERMIDIS  Resp Panel by RT-PCR (Flu A&B, Covid) Nasopharyngeal Swab     Status: Abnormal   Collection Time: 06/13/20  9:18 AM   Specimen: Nasopharyngeal Swab; Nasopharyngeal(NP) swabs in vial transport medium  Result Value Ref Range Status   SARS Coronavirus 2 by RT PCR POSITIVE (A) NEGATIVE Final    Comment: RESULT CALLED TO, READ BACK BY AND VERIFIED WITH: ANGELA ROBBINS 06/13/20 AT 1103 BY ACR (NOTE) SARS-CoV-2 target nucleic acids are DETECTED.  The SARS-CoV-2 RNA is generally detectable in upper respiratory specimens during the acute phase of infection. Positive results are indicative of the presence of the identified virus, but do not rule out bacterial infection or co-infection with other pathogens not detected  by the test. Clinical correlation with patient history and other diagnostic information is necessary to determine patient infection status. The expected result is Negative.  Fact Sheet for Patients: EntrepreneurPulse.com.au  Fact Sheet for Healthcare Providers: IncredibleEmployment.be  This test is not yet approved or cleared by the [place] FDA and  has been authorized for detection and/or diagnosis of SARS-CoV-2 by FDA under an Emergency Use Authorization (EUA).  This EUA will remain in effect (meaning this test can  be used) for the duration of  the COVID-19 declaration under Section 564(b)(1) of the Act, 21 U.S.C. section 360bbb-3(b)(1), unless the authorization  is terminated or revoked sooner.     Influenza A by PCR NEGATIVE NEGATIVE Final   Influenza B by PCR NEGATIVE NEGATIVE Final    Comment: (NOTE) The Xpert Xpress SARS-CoV-2/FLU/RSV plus assay is intended as an aid in the diagnosis of influenza from Nasopharyngeal swab specimens and should not be used as a sole basis for treatment. Nasal washings and aspirates are unacceptable for Xpert Xpress SARS-CoV-2/FLU/RSV testing.  Fact Sheet for Patients: EntrepreneurPulse.com.au  Fact Sheet for Healthcare Providers: IncredibleEmployment.be  This test is not yet approved or cleared by the [place] FDA and has been authorized for detection and/or diagnosis of SARS-CoV-2 by FDA under an Emergency Use Authorization (EUA). This EUA will remain in effect (meaning this test can be used) for the duration of the COVID-19 declaration under Section 564(b)(1) of the Act, 21 U.S.C. section 360bbb-3(b)(1), unless the authorization is terminated or revoked.  Performed at [facility], [address] [place], [place] [address]   CULTURE, BLOOD (ROUTINE X 2) w Reflex to ID Panel     Status: None (Preliminary result)   Collection Time: 06/14/20  8:37 AM   Specimen: BLOOD  Result Value Ref Range Status   Specimen Description BLOOD LEFT HAND  Final   Special Requests   Final    BOTTLES DRAWN AEROBIC AND ANAEROBIC Blood Culture adequate volume   Culture   Final    NO GROWTH 2 DAYS Performed at [facility], [address], [place], [place] [address]    Report Status PENDING  Incomplete  CULTURE, BLOOD (ROUTINE X 2) w Reflex to ID Panel     Status: None (Preliminary result)   Collection Time: 06/14/20  8:38 AM   Specimen: BLOOD  Result Value Ref Range Status   Specimen Description BLOOD LEFT HAND  Final   Special Requests   Final    BOTTLES DRAWN AEROBIC AND ANAEROBIC Blood Culture adequate volume   Culture   Final    NO GROWTH 2  DAYS Performed at [facility], [address], [place], [place] [address]    Report Status PENDING  Incomplete    Anti-infectives:  Anti-infectives (From admission, onward)   Start     Dose/Rate Route Frequency Ordered Stop   06/16/20 1000  remdesivir 100 mg in sodium chloride 0.9 % 100 mL IVPB       "Followed by" Linked Group Details   100 mg 200 mL/hr over 30 Minutes Intravenous Daily 06/15/20 0821 07/19/2020 0959   06/15/20 1400  ceFAZolin (ANCEF) IVPB 2g/100 mL premix        2 g 200 mL/hr over 30 Minutes Intravenous Every 8 hours 06/15/20 1118     06/15/20 0915  remdesivir 200 mg in sodium chloride 0.9% 250 mL IVPB       "Followed by" Linked Group Details   200 mg 580 mL/hr over 30  Minutes Intravenous Once 06/15/20 0821 06/16/20 1637   06/15/20 0900  clindamycin (CLEOCIN) IVPB 600 mg  Status:  Discontinued        600 mg 100 mL/hr over 30 Minutes Intravenous Every 8 hours 06/15/20 0748 06/15/20 1118   06/13/20 0600  cefTRIAXone (ROCEPHIN) 1 g in sodium chloride 0.9 % 100 mL IVPB  Status:  Discontinued        1 g 200 mL/hr over 30 Minutes Intravenous Every 24 hours 06/13/20 0220 06/15/20 0748   05/28/2020 2300  vancomycin (VANCOCIN) IVPB 1000 mg/200 mL premix       "Followed by" Linked Group Details   1,000 mg 200 mL/hr over 60 Minutes Intravenous  Once 05/19/2020 2256 06/13/20 0042   06/02/2020 2300  vancomycin (VANCOCIN) IVPB 1000 mg/200 mL premix       "Followed by" Linked Group Details   1,000 mg 200 mL/hr over 60 Minutes Intravenous  Once 05/17/2020 2256 06/13/20 0150   06/03/2020 2245  ceFEPIme (MAXIPIME) 2 g in sodium chloride 0.9 % 100 mL IVPB        2 g 200 mL/hr over 30 Minutes Intravenous  Once 06/07/2020 2240 05/29/2020 2340   05/16/2020 2245  vancomycin (VANCOCIN) IVPB 1000 mg/200 mL premix  Status:  Discontinued        1,000 mg 200 mL/hr over 60 Minutes Intravenous  Once 05/18/2020 2240 05/19/2020 2254        PAST MEDICAL HISTORY   Past Medical History:  Diagnosis  Date  . BP (high blood pressure) 04/16/2015  . BPH (benign prostatic hyperplasia)   . BPH with obstruction/lower urinary tract symptoms 04/20/2015  . Cancer ([place])    skin  . Carotid artery stenosis 11/11/2016  . Cerebral palsy ([place])   . Elevated PSA   . Hematuria   . Hemiplegia ([place]) 09/13/2011   Overview:  Upper extremity most affected   . History of elevated PSA 04/20/2015  . Hydronephrosis   . Kidney stone   . Nocturia   . Obstructive apnea 12/29/2014  . Osteoporosis   . 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  none since starting keppra  . 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 Seizures ([place])    1956-62. esolved after brain surgery  . Sleep apnea    CPAP  . Urinary retention      SURGICAL HISTORY   Past Surgical History:  Procedure Laterality Date  . APPENDECTOMY    . arm surgery Right    child  . [place]  . CATARACT EXTRACTION W/PHACO Left 09/13/2017   Procedure: CATARACT EXTRACTION PHACO AND INTRAOCULAR LENS PLACEMENT ([place]) LEFT;  Surgeon: Leandrew Koyanagi, MD;  Location: [place];  Service: Ophthalmology;  Laterality: Left;  sleep apnea  . CATARACT EXTRACTION W/PHACO Right 11/28/2017   Procedure: CATARACT EXTRACTION PHACO AND INTRAOCULAR LENS PLACEMENT ([facility]);  Surgeon: Leandrew Koyanagi, MD;  Location: [facility];  Service: Ophthalmology;  Laterality: Right;  [place] 00:55 AP% 9.2 CDE 5.04 Fluid pack lot # [id number]  . LEG SURGERY Right    child  . TONSILLECTOMY       FAMILY HISTORY   Family History  Problem Relation Age of Onset  . Breast cancer Maternal Aunt   . Dementia Mother   . CAD Father   . Prostate cancer Neg Hx   . Bladder Cancer Neg Hx   . Kidney cancer Neg Hx      SOCIAL HISTORY   Social History  Tobacco Use  . Smoking status: Never Smoker  . Smokeless tobacco: Never Used  Vaping Use  . Vaping Use: Never used  Substance Use Topics  . Alcohol use: No    Alcohol/week: 0.0 standard  drinks  . Drug use: No     MEDICATIONS   Current Medication:  Current Facility-Administered Medications:  .  0.9 %  sodium chloride infusion, , Intravenous, PRN, Wyvonnia Dusky, MD, Last Rate: 10 mL/hr at 06/16/20 1448, Infusion Verify at 06/16/20 1448 .  acetaminophen (OFIRMEV) IV 500 mg, 500 mg, Intravenous, Q6H PRN, Mansy, Jan A, MD .  acetaminophen (TYLENOL) tablet 650 mg, 650 mg, Oral, Q6H PRN, 650 mg at 06/15/20 0800 **OR** acetaminophen (TYLENOL) suppository 650 mg, 650 mg, Rectal, Q6H PRN, Vernelle Emerald, MD, 650 mg at 06/16/20 0201 .  aspirin EC tablet 81 mg, 81 mg, Oral, Daily, Wyvonnia Dusky, MD, 81 mg at 06/15/20 1646 .  baricitinib (OLUMIANT) tablet 4 mg, 4 mg, Oral, Daily, Eppie Gibson M, MD .  ceFAZolin (ANCEF) IVPB 2g/100 mL premix, 2 g, Intravenous, Q8H, Wyvonnia Dusky, MD, Last Rate: 200 mL/hr at 06/16/20 1430, 2 g at 06/16/20 1430 .  dexamethasone (DECADRON) injection 6 mg, 6 mg, Intravenous, Q24H, Wyvonnia Dusky, MD, 6 mg at 06/16/20 0846 .  enoxaparin (LOVENOX) injection 40 mg, 40 mg, Subcutaneous, Q24H, Renda Rolls, RPH, 40 mg at 06/16/20 0844 .  finasteride (PROSCAR) tablet 5 mg, 5 mg, Oral, Daily, Shalhoub, Sherryll Burger, MD, 5 mg at 06/15/20 0800 .  ibuprofen (ADVIL) tablet 400 mg, 400 mg, Oral, Q4H PRN, Mansy, Jan A, MD .  levETIRAcetam (KEPPRA) 750 mg in sodium chloride 0.9 % 100 mL IVPB, 750 mg, Intravenous, Q12H, Wyvonnia Dusky, MD, Last Rate: 430 mL/hr at 06/16/20 1106, 750 mg at 06/16/20 1106 .  metoprolol tartrate (LOPRESSOR) injection 5 mg, 5 mg, Intravenous, Q6H PRN, Wyvonnia Dusky, MD, 5 mg at 06/16/20 1800 .  morphine 2 MG/ML injection 1 mg, 1 mg, Intravenous, Q4H PRN, Wyvonnia Dusky, MD, 1 mg at 06/16/20 1810 .  ondansetron (ZOFRAN) tablet 4 mg, 4 mg, Oral, Q6H PRN **OR** ondansetron (ZOFRAN) injection 4 mg, 4 mg, Intravenous, Q6H PRN, Shalhoub, Sherryll Burger, MD .  phenytoin (DILANTIN) ER capsule 300 mg, 300 mg, Oral,  QHS, Shalhoub, Sherryll Burger, MD, 300 mg at 06/15/20 2051 .  polyethylene glycol (MIRALAX / GLYCOLAX) packet 17 g, 17 g, Oral, Daily PRN, Shalhoub, Sherryll Burger, MD .  Margrett Rud remdesivir 200 mg in sodium chloride 0.9% 250 mL IVPB, 200 mg, Intravenous, Once, Last Rate: 580 mL/hr at 06/16/20 1607, 200 mg at 06/16/20 1607 **FOLLOWED BY** remdesivir 100 mg in sodium chloride 0.9 % 100 mL IVPB, 100 mg, Intravenous, Daily, Shanlever, Charles M, RPH .  tamsulosin [facility]) capsule 0.4 mg, 0.4 mg, Oral, Daily, Shalhoub, Sherryll Burger, MD, 0.4 mg at 06/15/20 0800    ALLERGIES   Oxycodone-acetaminophen    REVIEW OF SYSTEMS    Unable to obtain due to acute respiratory distress   PHYSICAL EXAMINATION   Vital Signs: Temp:  [97.6 F (36.4 C)-103.3 F (39.6 C)] 99.9 F (37.7 C) (01/04 1755) Pulse Rate:  [90-127] 123 (01/04 1755) Resp:  [20-38] 38 (01/04 1755) BP: (128-159)/(64-83) 141/70 (01/04 1544) SpO2:  [84 %-100 %] 84 % (01/04 1755) FiO2 (%):  [100 %] 100 % (01/04 0354) Weight:  [80 kg] 80 kg (01/04 0354)  GENERAL:age appropraite in distress due to respiratory failure HEAD: Normocephalic, atraumatic.  EYES: Pupils  equal, round, reactive to light.  No scleral icterus.  MOUTH: Moist mucosal membrane. NECK: Supple. No thyromegaly. No nodules. No JVD.  PULMONARY: rhonchi bilaterally  CARDIOVASCULAR: S1 and S2. Regular rate and rhythm. No murmurs, rubs, or gallops.  GASTROINTESTINAL: Soft, nontender, non-distended. No masses. Positive bowel sounds. No hepatosplenomegaly.  MUSCULOSKELETAL: No swelling, clubbing, or edema.  NEUROLOGIC: distress due to acute illness with hypoxemia SKIN:intact,warm,dry   PERTINENT DATA     Infusions: . sodium chloride 10 mL/hr at 06/16/20 1448  . acetaminophen    .  ceFAZolin (ANCEF) IV 2 g (06/16/20 1430)  . levETIRAcetam 750 mg (06/16/20 1106)  . remdesivir 100 mg in NS 100 mL     Scheduled Medications: . aspirin EC  81 mg Oral Daily  . baricitinib  4  mg Oral Daily  . dexamethasone (DECADRON) injection  6 mg Intravenous Q24H  . enoxaparin (LOVENOX) injection  40 mg Subcutaneous Q24H  . finasteride  5 mg Oral Daily  . phenytoin  300 mg Oral QHS  . tamsulosin  0.4 mg Oral Daily   PRN Medications: sodium chloride, acetaminophen, acetaminophen **OR** acetaminophen, ibuprofen, metoprolol tartrate, morphine injection, ondansetron **OR** ondansetron (ZOFRAN) IV, polyethylene glycol Hemodynamic parameters:   Intake/Output: 01/03 0701 - 01/04 0700 In: 697.5 [P.O.:200; I.V.:10; IV Piggyback:487.5] Out: -   Ventilator  Settings: FiO2 (%):  [100 %] 100 %    LAB RESULTS:  Basic Metabolic Panel: Recent Labs  Lab 06/13/20 0636 06/14/20 0432 06/15/20 0400 06/16/20 0515  NA 133* 133* 133* 132*  K 3.2* 3.5 3.7 3.4*  CL 101 100 101 100  CO2 23 20* 22 20*  GLUCOSE 98 103* 112* 107*  BUN 16 13 16 18   CREATININE 0.96 0.89 0.85 0.90  CALCIUM 8.4* 8.1* 7.9* 8.3*  MG 1.7  --   --   --    Liver Function Tests: Recent Labs  Lab 06/13/20 0636 06/14/20 0432 06/15/20 0400 06/16/20 0515  AST 194* 193* 195* 181*  ALT 62* 75* 83* 75*  ALKPHOS 40 36* 40 35*  BILITOT 0.6 0.8 0.8 0.8  PROT 5.8* 5.9* 5.7* 5.6*  ALBUMIN 2.4* 2.3* 2.4* 2.1*   No results for input(s): LIPASE, AMYLASE in the last 168 hours. No results for input(s): AMMONIA in the last 168 hours. CBC: Recent Labs  Lab 06/09/2020 2213 06/13/20 0636 06/14/20 0432 06/15/20 0400 06/16/20 0515  WBC 10.0 5.8 7.5 7.2 9.4  NEUTROABS 8.8* 3.9  --   --   --   HGB 14.2 12.6* 13.1 13.0 13.0  HCT 41.3 36.5* 36.0* 36.3* 36.2*  MCV 96.5 96.3 92.8 93.1 92.6  PLT 253 192 254 286 326   Cardiac Enzymes: No results for input(s): CKTOTAL, CKMB, CKMBINDEX, TROPONINI in the last 168 hours. BNP: Invalid input(s): POCBNP CBG: No results for input(s): GLUCAP in the last 168 hours.     IMAGING RESULTS:  Imaging: CT ANGIO CHEST PE W OR WO CONTRAST  Result Date: 06/15/2020 CLINICAL  DATA:  COVID pneumonia, elevated D-dimer EXAM: CT ANGIOGRAPHY CHEST WITH CONTRAST TECHNIQUE: Multidetector CT imaging of the chest was performed using the standard protocol during bolus administration of intravenous contrast. Multiplanar CT image reconstructions and MIPs were obtained to evaluate the vascular anatomy. CONTRAST:  64m OMNIPAQUE IOHEXOL 350 MG/ML SOLN COMPARISON:  None. FINDINGS: Cardiovascular: There is adequate opacification of the pulmonary arterial tree. No intraluminal filling defect identified to suggest acute pulmonary embolism. The central pulmonary arteries are enlarged in keeping with changes of pulmonary arterial hypertension.  No significant coronary artery calcification. Global cardiac size within normal limits. Small fluid noted within the superior pericardial recess. Mild atherosclerotic calcification noted within the thoracic aorta. No aortic aneurysm. Mediastinum/Nodes: There is shotty right hilar, subcarinal, and right paratracheal adenopathy, likely reactive in nature. Esophagus is unremarkable. Thyroid unremarkable. Small hiatal hernia. Lungs/Pleura: There is extensive, asymmetric, diffuse ground-glass pulmonary infiltrate and pulmonary consolidation, likely infectious or inflammatory in nature. No pneumothorax. Trace left and small right pleural effusions are present with associated right basilar compressive atelectasis. Central airways are widely patent. Upper Abdomen: Mild bilateral perinephric stranding is partially visualized though the kidneys are largely excluded from view. No acute abnormality identified. Musculoskeletal: No acute bone abnormality. Review of the MIP images confirms the above findings. IMPRESSION: No pulmonary embolism. Extensive multifocal ground-glass pulmonary infiltrate and pulmonary consolidation with associated small bilateral pleural effusions. The findings are in keeping with atypical infection in the acute setting and, while not pathognomonic of  COVID-19 pneumonia, can be seen the setting of acute COVID infection. Aortic Atherosclerosis (ICD10-I70.0). Electronically Signed   By: Fidela Salisbury MD   On: 06/15/2020 00:25    ASSESSMENT AND PLAN    -Multidisciplinary rounds held today  Acute Hypoxic Respiratory Failure Acute COVID19 infection with severe ARDS -Remdesevir antiviral - pharmacy protocol 5 d -vitamin C -zinc -decadron 36m IV daily  -follow ARDS net protocol -Diuresis - Lasix 40 IV daily - monitor UOP - utilize external urinary catheter if possible -Self prone if patient can tolerate  -encourage to use IS and Acapella device for bronchopulmonary hygiene when able -d/c hepatotoxic medications while on remdesevir -supportive care with ICU telemetry monitoring -PT/OT when possible -procalcitonin, CRP and ferritin trending -continue Bronchodilator Therapy -Wean Fio2 and PEEP as tolerated - perform SAT/SBT when respiratory parameters are met   Chronic seizure disorder  - continue phenytoin and Keppra ICU monitoring  ID -continue IV abx as prescibed -follow up cultures  GI/Nutrition GI PROPHYLAXIS as indicated DIET-->TF's as tolerated Constipation protocol as indicated  ENDO - ICU hypoglycemic\Hyperglycemia protocol -check FSBS per protocol   ELECTROLYTES -follow labs as needed -replace as needed -pharmacy consultation   DVT/GI PRX ordered -SCDs  TRANSFUSIONS AS NEEDED MONITOR FSBS ASSESS the need for LABS as needed   Critical care provider statement:    Critical care time (minutes):  33   Critical care time was exclusive of:  Separately billable procedures and treating other patients   Critical care was necessary to treat or prevent imminent or life-threatening deterioration of the following conditions:  Acute hypoxemic respiratory failure due to COVID19 , multiple comorbid conditions.    Critical care was time spent personally by me on the following activities:  Development of treatment plan  with patient or surrogate, discussions with consultants, evaluation of patient's response to treatment, examination of patient, obtaining history from patient or surrogate, ordering and performing treatments and interventions, ordering and review of laboratory studies and re-evaluation of patient's condition.  I assumed direction of critical care for this patient from another provider in my specialty: no    This document was prepared using Dragon voice recognition software and may include unintentional dictation errors.    FOttie Glazier M.D.  Division of [facility]

## 2020-06-16 NOTE — Progress Notes (Signed)
Patient brought over in bed with RN and RT on Bipap 100% covered in feces.  Increased work of breathing and tachycardia noted.  Patient is in sinus tach.  Discussed with patient that MD was going to want to intubate him.  Patient said he did not want to be intubated and to call his emergency contact.  Told MD.  MD said no answer this was an emergency and we were intubating.  Order for medications to intubate.

## 2020-06-16 NOTE — Progress Notes (Signed)
PIV consult: Pt assessed with ultrasound. Limited access options. Second site established in L forearm. Recommend central access if prolonged infusions are required.

## 2020-06-17 ENCOUNTER — Inpatient Hospital Stay: Payer: Medicare Other

## 2020-06-17 ENCOUNTER — Other Ambulatory Visit: Payer: Medicare Other | Admitting: Orthotics

## 2020-06-17 ENCOUNTER — Inpatient Hospital Stay: Payer: Self-pay

## 2020-06-17 DIAGNOSIS — U071 COVID-19: Secondary | ICD-10-CM

## 2020-06-17 DIAGNOSIS — J80 Acute respiratory distress syndrome: Secondary | ICD-10-CM

## 2020-06-17 DIAGNOSIS — N390 Urinary tract infection, site not specified: Secondary | ICD-10-CM

## 2020-06-17 DIAGNOSIS — A419 Sepsis, unspecified organism: Secondary | ICD-10-CM

## 2020-06-17 DIAGNOSIS — J1282 Pneumonia due to coronavirus disease 2019: Secondary | ICD-10-CM | POA: Diagnosis not present

## 2020-06-17 LAB — CULTURE, BLOOD (ROUTINE X 2)
Culture: NO GROWTH
Special Requests: ADEQUATE

## 2020-06-17 LAB — GLUCOSE, CAPILLARY
Glucose-Capillary: 104 mg/dL — ABNORMAL HIGH (ref 70–99)
Glucose-Capillary: 105 mg/dL — ABNORMAL HIGH (ref 70–99)

## 2020-06-17 MED ORDER — VECURONIUM BROMIDE 10 MG IV SOLR
INTRAVENOUS | Status: AC
  Filled 2020-06-17: qty 10

## 2020-06-17 MED ORDER — CHLORHEXIDINE GLUCONATE 0.12% ORAL RINSE (MEDLINE KIT)
15.0000 mL | Freq: Two times a day (BID) | OROMUCOSAL | Status: DC
  Administered 2020-06-17 – 2020-06-20 (×7): 15 mL via OROMUCOSAL

## 2020-06-17 MED ORDER — MIDAZOLAM HCL 2 MG/2ML IJ SOLN
INTRAMUSCULAR | Status: AC
  Filled 2020-06-17: qty 2

## 2020-06-17 MED ORDER — NOREPINEPHRINE 4 MG/250ML-% IV SOLN
2.0000 ug/min | INTRAVENOUS | Status: DC
  Administered 2020-06-17: 2 ug/min via INTRAVENOUS
  Administered 2020-06-17: 4 ug/min via INTRAVENOUS
  Administered 2020-06-18: 6 ug/min via INTRAVENOUS
  Administered 2020-06-18: 4 ug/min via INTRAVENOUS
  Administered 2020-06-19: 1 ug/min via INTRAVENOUS
  Administered 2020-06-20: 10 ug/min via INTRAVENOUS
  Filled 2020-06-17 (×6): qty 250

## 2020-06-17 MED ORDER — ORAL CARE MOUTH RINSE
15.0000 mL | OROMUCOSAL | Status: DC
  Administered 2020-06-17 – 2020-06-20 (×32): 15 mL via OROMUCOSAL

## 2020-06-17 MED ORDER — SODIUM CHLORIDE 0.9 % IV SOLN
250.0000 mL | INTRAVENOUS | Status: DC
  Administered 2020-06-17: 250 mL via INTRAVENOUS

## 2020-06-17 NOTE — Progress Notes (Signed)
PT Cancellation Note  Patient Details Name: Dylan Sullivan MRN: [id number] DOB: 19-Jan-1946   Cancelled Treatment:    Reason Eval/Treat Not Completed: Medical issues which prohibited therapy (Per chart review, patient transferred to CCU due to decline in respiratory status; pending transition to comfort care.  Initial PT order completed; please re-consult should goals of care change.)   Morna Flud H. Manson Passey, PT, DPT, NCS 06/17/20, 8:04 AM [phone]

## 2020-06-17 NOTE — Progress Notes (Signed)
Was able to speak with pt's POA Bufford Lope and update her on Mr. Eke's critical illness and decompensation yesterday requiring intubation.  She reports that pt would not have wanted any aggressive or life-sustaining measure including intubation.  She reports he has a DNR/DNI status.  She requests that we transition to COMFORT CARE MEASURES later today when she is able to come to bedside.  Will update Dr. Belia Heman and rounding team today.    Harlon Ditty, AGACNP-BC [facility] Pager: [phone]

## 2020-06-17 NOTE — Progress Notes (Signed)
CRITICAL CARE NOTE  1/4 intubated for severe COVID 19 infection and pneumonia  CC  follow up respiratory failure  SUBJECTIVE Patient remains critically ill Prognosis is guarded   Vent Mode: PRVC FiO2 (%):  [90 %-100 %] 90 % Set Rate:  [20 bmp-24 bmp] 24 bmp Vt Set:  [450 mL] 450 mL PEEP:  [10 cmH20] 10 cmH20  CBC    Component Value Date/Time   WBC 9.4 06/16/2020 0515   RBC 3.91 (L) 06/16/2020 0515   HGB 13.0 06/16/2020 0515   HGB 15.2 09/09/2012 1330   HCT 36.2 (L) 06/16/2020 0515   HCT 42.6 09/09/2012 1330   PLT 326 06/16/2020 0515   PLT 370 09/09/2012 1330   MCV 92.6 06/16/2020 0515   MCV 98 09/09/2012 1330   MCH 33.2 06/16/2020 0515   MCHC 35.9 06/16/2020 0515   RDW 12.2 06/16/2020 0515   RDW 12.3 09/09/2012 1330   LYMPHSABS 1.4 06/13/2020 0636   MONOABS 0.5 06/13/2020 0636   EOSABS 0.0 06/13/2020 0636   BASOSABS 0.0 06/13/2020 0636   BMP Latest Ref Rng & Units 06/16/2020 06/15/2020 06/14/2020  Glucose 70 - 99 mg/dL 107(H) 112(H) 103(H)  BUN 8 - 23 mg/dL 18 16 13   Creatinine 0.61 - 1.24 mg/dL 0.90 0.85 0.89  Sodium 135 - 145 mmol/L 132(L) 133(L) 133(L)  Potassium 3.5 - 5.1 mmol/L 3.4(L) 3.7 3.5  Chloride 98 - 111 mmol/L 100 101 100  CO2 22 - 32 mmol/L 20(L) 22 20(L)  Calcium 8.9 - 10.3 mg/dL 8.3(L) 7.9(L) 8.1(L)     BP (!) 96/46   Pulse (!) 111   Temp (!) 100.4 F (38 C) (Esophageal)   Resp 14   Ht 5\' 8"  (1.727 m)   Wt 80 kg   SpO2 98%   BMI 26.82 kg/m    I/O last 3 completed shifts: In: 1761.7 [P.O.:200; I.V.:641.1; IV Piggyback:920.6] Out: 1450 [Urine:1450] No intake/output data recorded.  SpO2: 98 % O2 Flow Rate (L/min): 20 L/min FiO2 (%): 90 %  Estimated body mass index is 26.82 kg/m as calculated from the following:   Height as of this encounter: 5\' 8"  (1.727 m).   Weight as of this encounter: 80 kg.  SIGNIFICANT EVENTS   REVIEW OF SYSTEMS  PATIENT IS UNABLE TO PROVIDE COMPLETE REVIEW OF SYSTEMS DUE TO SEVERE CRITICAL ILLNESS       COVID-19 DISASTER DECLARATION:   FULL CONTACT PHYSICAL EXAMINATION WAS NOT POSSIBLE DUE TO TREATMENT OF COVID-19  AND CONSERVATION OF PERSONAL PROTECTIVE EQUIPMENT, LIMITED EXAM FINDINGS INCLUDE-   PHYSICAL EXAMINATION:  GENERAL:critically ill appearing, +resp distress NEUROLOGIC: obtunded, GCS<8   Patient assessed or the symptoms described in the history of present illness.  In the context of the Global COVID-19 pandemic, which necessitated consideration that the patient might be at risk for infection with the SARS-CoV-2 virus that causes COVID-19, Institutional protocols and algorithms that pertain to the evaluation of patients at risk for COVID-19 are in a state of rapid change based on information released by regulatory bodies including the CDC and federal and state organizations. These policies and algorithms were followed during the patient's care while in hospital.    MEDICATIONS: I have reviewed all medications and confirmed regimen as documented   CULTURE RESULTS   Recent Results (from the past 240 hour(s))  Culture, blood (Routine x 2)     Status: None   Collection Time: 06/10/2020 10:13 PM   Specimen: BLOOD  Result Value Ref Range Status   Specimen Description  BLOOD BLOOD RIGHT FOREARM  Final   Special Requests   Final    BOTTLES DRAWN AEROBIC AND ANAEROBIC Blood Culture adequate volume   Culture   Final    NO GROWTH 5 DAYS Performed at [facility], [address] [place], [place] [address]    Report Status 06/17/2020 FINAL  Final  Culture, blood (Routine x 2)     Status: Abnormal   Collection Time: 05/22/2020 10:30 PM   Specimen: BLOOD  Result Value Ref Range Status   Specimen Description   Final    BLOOD BLOOD LEFT ARM Performed at [facility], [address], [place], [place] [address]    Special Requests   Final    BOTTLES DRAWN AEROBIC AND ANAEROBIC Blood Culture results may not be optimal due to an inadequate volume of blood received  in culture bottles Performed at [facility], [address] [place], [place] [address]    Culture  Setup Time   Final    GRAM POSITIVE COCCI AEROBIC BOTTLE ONLY CRITICAL RESULT CALLED TO, READ BACK BY AND VERIFIED WITH: NATHAN BLEUE @0237  ON 06/14/20 SKL    Culture (A)  Final    STAPHYLOCOCCUS EPIDERMIDIS THE SIGNIFICANCE OF ISOLATING THIS ORGANISM FROM A SINGLE SET OF BLOOD CULTURES WHEN MULTIPLE SETS ARE DRAWN IS UNCERTAIN. PLEASE NOTIFY THE MICROBIOLOGY DEPARTMENT WITHIN ONE WEEK IF SPECIATION AND SENSITIVITIES ARE REQUIRED. Performed at [facility], [place] [address] [place], [place] [address]    Report Status 06/15/2020 FINAL  Final  Blood Culture ID Panel (Reflexed)     Status: Abnormal   Collection Time: 05/23/2020 10:30 PM  Result Value Ref Range Status   Enterococcus faecalis NOT DETECTED NOT DETECTED Final   Enterococcus Faecium NOT DETECTED NOT DETECTED Final   Listeria monocytogenes NOT DETECTED NOT DETECTED Final   Staphylococcus species DETECTED (A) NOT DETECTED Final    Comment: CRITICAL RESULT CALLED TO, READ BACK BY AND VERIFIED WITH: NATHAN BELUE @0237  ON 06/14/20 SKL    Staphylococcus aureus (BCID) NOT DETECTED NOT DETECTED Final   Staphylococcus epidermidis DETECTED (A) NOT DETECTED Final    Comment: CRITICAL RESULT CALLED TO, READ BACK BY AND VERIFIED WITH: NATHAN BELUE @0237  ON 06/14/20 SKL    Staphylococcus lugdunensis NOT DETECTED NOT DETECTED Final   Streptococcus species NOT DETECTED NOT DETECTED Final   Streptococcus agalactiae NOT DETECTED NOT DETECTED Final   Streptococcus pneumoniae NOT DETECTED NOT DETECTED Final   Streptococcus pyogenes NOT DETECTED NOT DETECTED Final   A.calcoaceticus-baumannii NOT DETECTED NOT DETECTED Final   Bacteroides fragilis NOT DETECTED NOT DETECTED Final   Enterobacterales NOT DETECTED NOT DETECTED Final   Enterobacter cloacae complex NOT DETECTED NOT DETECTED Final   Escherichia coli NOT DETECTED NOT  DETECTED Final   Klebsiella aerogenes NOT DETECTED NOT DETECTED Final   Klebsiella oxytoca NOT DETECTED NOT DETECTED Final   Klebsiella pneumoniae NOT DETECTED NOT DETECTED Final   Proteus species NOT DETECTED NOT DETECTED Final   Salmonella species NOT DETECTED NOT DETECTED Final   Serratia marcescens NOT DETECTED NOT DETECTED Final   Haemophilus influenzae NOT DETECTED NOT DETECTED Final   Neisseria meningitidis NOT DETECTED NOT DETECTED Final   Pseudomonas aeruginosa NOT DETECTED NOT DETECTED Final   Stenotrophomonas maltophilia NOT DETECTED NOT DETECTED Final   Candida albicans NOT DETECTED NOT DETECTED Final   Candida auris NOT DETECTED NOT DETECTED Final   Candida glabrata NOT DETECTED NOT DETECTED Final   Candida krusei NOT DETECTED NOT  DETECTED Final   Candida parapsilosis NOT DETECTED NOT DETECTED Final   Candida tropicalis NOT DETECTED NOT DETECTED Final   Cryptococcus neoformans/gattii NOT DETECTED NOT DETECTED Final   Methicillin resistance mecA/C NOT DETECTED NOT DETECTED Final    Comment: Performed at [facility], [address] [place], [place] [address]  Urine culture     Status: Abnormal   Collection Time: 06/01/2020 10:37 PM   Specimen: Urine, Random  Result Value Ref Range Status   Specimen Description   Final    URINE, RANDOM Performed at [facility], [address], [place], [place] [address]    Special Requests   Final    NONE Performed at [facility], [address] [place], [place] [address]    Culture >=100,000 COLONIES/mL STAPHYLOCOCCUS EPIDERMIDIS (A)  Final   Report Status 06/15/2020 FINAL  Final   Organism ID, Bacteria STAPHYLOCOCCUS EPIDERMIDIS (A)  Final      Susceptibility   Staphylococcus epidermidis - MIC*    CIPROFLOXACIN >=8 RESISTANT Resistant     GENTAMICIN <=0.5 SENSITIVE Sensitive     NITROFURANTOIN 32 SENSITIVE Sensitive     OXACILLIN <=0.25 SENSITIVE Sensitive     TETRACYCLINE <=1 SENSITIVE Sensitive      VANCOMYCIN <=0.5 SENSITIVE Sensitive     TRIMETH/SULFA <=10 SENSITIVE Sensitive     CLINDAMYCIN <=0.25 SENSITIVE Sensitive     RIFAMPIN <=0.5 SENSITIVE Sensitive     Inducible Clindamycin NEGATIVE Sensitive     * >=100,000 COLONIES/mL STAPHYLOCOCCUS EPIDERMIDIS  Resp Panel by RT-PCR (Flu A&B, Covid) Nasopharyngeal Swab     Status: Abnormal   Collection Time: 06/13/20  9:18 AM   Specimen: Nasopharyngeal Swab; Nasopharyngeal(NP) swabs in vial transport medium  Result Value Ref Range Status   SARS Coronavirus 2 by RT PCR POSITIVE (A) NEGATIVE Final    Comment: RESULT CALLED TO, READ BACK BY AND VERIFIED WITH: ANGELA ROBBINS 06/13/20 AT 1103 BY ACR (NOTE) SARS-CoV-2 target nucleic acids are DETECTED.  The SARS-CoV-2 RNA is generally detectable in upper respiratory specimens during the acute phase of infection. Positive results are indicative of the presence of the identified virus, but do not rule out bacterial infection or co-infection with other pathogens not detected by the test. Clinical correlation with patient history and other diagnostic information is necessary to determine patient infection status. The expected result is Negative.  Fact Sheet for Patients: EntrepreneurPulse.com.au  Fact Sheet for Healthcare Providers: IncredibleEmployment.be  This test is not yet approved or cleared by the [place] FDA and  has been authorized for detection and/or diagnosis of SARS-CoV-2 by FDA under an Emergency Use Authorization (EUA).  This EUA will remain in effect (meaning this test can  be used) for the duration of  the COVID-19 declaration under Section 564(b)(1) of the Act, 21 U.S.C. section 360bbb-3(b)(1), unless the authorization is terminated or revoked sooner.     Influenza A by PCR NEGATIVE NEGATIVE Final   Influenza B by PCR NEGATIVE NEGATIVE Final    Comment: (NOTE) The Xpert Xpress SARS-CoV-2/FLU/RSV plus assay is intended as an  aid in the diagnosis of influenza from Nasopharyngeal swab specimens and should not be used as a sole basis for treatment. Nasal washings and aspirates are unacceptable for Xpert Xpress SARS-CoV-2/FLU/RSV testing.  Fact Sheet for Patients: EntrepreneurPulse.com.au  Fact Sheet for Healthcare Providers: IncredibleEmployment.be  This test is not yet approved or cleared by the [place] FDA and has been authorized for detection and/or diagnosis of SARS-CoV-2 by FDA under  an Emergency Use Authorization (EUA). This EUA will remain in effect (meaning this test can be used) for the duration of the COVID-19 declaration under Section 564(b)(1) of the Act, 21 U.S.C. section 360bbb-3(b)(1), unless the authorization is terminated or revoked.  Performed at [facility], [address] [place], [place] [address]   CULTURE, BLOOD (ROUTINE X 2) w Reflex to ID Panel     Status: None (Preliminary result)   Collection Time: 06/14/20  8:37 AM   Specimen: BLOOD  Result Value Ref Range Status   Specimen Description BLOOD LEFT HAND  Final   Special Requests   Final    BOTTLES DRAWN AEROBIC AND ANAEROBIC Blood Culture adequate volume   Culture   Final    NO GROWTH 3 DAYS Performed at [facility], [address], [place], [place] [address]    Report Status PENDING  Incomplete  CULTURE, BLOOD (ROUTINE X 2) w Reflex to ID Panel     Status: None (Preliminary result)   Collection Time: 06/14/20  8:38 AM   Specimen: BLOOD  Result Value Ref Range Status   Specimen Description BLOOD LEFT HAND  Final   Special Requests   Final    BOTTLES DRAWN AEROBIC AND ANAEROBIC Blood Culture adequate volume   Culture   Final    NO GROWTH 3 DAYS Performed at [facility], [address] [place], [place] [address]    Report Status PENDING  Incomplete  MRSA PCR Screening     Status: None   Collection Time: 06/16/20  6:52 PM   Specimen: Nasal  Mucosa; Nasopharyngeal  Result Value Ref Range Status   MRSA by PCR NEGATIVE NEGATIVE Final    Comment:        The GeneXpert MRSA Assay (FDA approved for NASAL specimens only), is one component of a comprehensive MRSA colonization surveillance program. It is not intended to diagnose MRSA infection nor to guide or monitor treatment for MRSA infections. Performed at [facility], [address] [place], [place] [address]           IMAGING    DG Neck Soft Tissue  Result Date: 06/17/2020 CLINICAL DATA:  Acute respiratory failure, hypoxia EXAM: NECK SOFT TISSUES - 1+ VIEW COMPARISON:  None. FINDINGS: Endotracheal tube tip is seen within the subglottic airway. Esophageal Doppler probe extends into the upper esophagus. Under distension of the a hypopharynx and overlying tubing limits evaluation. The prevertebral soft tissues are not thickened. Extensive pulmonary infiltrates are seen within the visualized lung apices. IMPRESSION: Endotracheal tube tip seen within the subglottic airway. Electronically Signed   By: Fidela Salisbury MD   On: 06/17/2020 04:45   DG Chest Port 1 View  Result Date: 06/17/2020 CLINICAL DATA:  Intubation EXAM: PORTABLE CHEST 1 VIEW COMPARISON:  4:17 a.m. FINDINGS: The endotracheal tube is been advanced and its tip is now seen at the level of the clavicular head 7.6 cm above the carina. Esophageal Doppler probe overlies the mid esophagus, unchanged. Pulmonary insufflation remain stable. Extensive bilateral pulmonary infiltrates are again identified. No pneumothorax or pleural effusion. Cardiac size within normal limits. IMPRESSION: Interval advancement of the endotracheal tube, now seen at the level of the clavicular heads. Stable extensive bilateral pulmonary infiltrates, likely infectious Electronically Signed   By: Fidela Salisbury MD   On: 06/17/2020 05:42   DG Chest Port 1 View  Result Date: 06/17/2020 CLINICAL DATA:  Acute respiratory failure, hypoxia EXAM:  PORTABLE CHEST 1 VIEW COMPARISON:  06/16/2020  FINDINGS: The endotracheal tube has withdrawn and its tip is now seen within the subglottic airway roughly 12 cm above the carina. Esophageal Doppler probe overlies the expected mid esophagus. Pulmonary insufflation remains stable. Superimposed multifocal pulmonary infiltrates, more focal within the left mid lung zone appears he slightly improved within the right upper lobe and progressive within the mid lung zones bilaterally. No pneumothorax or pleural effusion. Cardiac size within normal limits. IMPRESSION: Endotracheal tube has been withdrawn into the subglottic airway, roughly 12 cm above the carina. Advancement is recommended for optimal positioning. Waxing and waning pulmonary infiltrates, likely infectious. These results will be called to the ordering clinician or representative by the Radiologist Assistant, and communication documented in the PACS or [employer]. Electronically Signed   By: Helyn Numbers MD   On: 06/17/2020 04:42   DG Chest Port 1 View  Result Date: 06/16/2020 CLINICAL DATA:  Endotracheal tube placement EXAM: PORTABLE CHEST 1 VIEW COMPARISON:  05/19/2020 FINDINGS: Endotracheal tube is approximately 7 cm above the carina. Left greater than right opacities are present. No significant pleural effusion. No pneumothorax. Stable cardiomediastinal contours. IMPRESSION: Endotracheal tube 7 cm above the carina. Bilateral pulmonary opacities likely reflecting multifocal pneumonia. Electronically Signed   By: Guadlupe Spanish M.D.   On: 06/16/2020 19:47   [place] EKG SITE RITE  Result Date: 06/17/2020 If Site Rite image not attached, placement could not be confirmed due to current cardiac rhythm.    Nutrition Status:           Indwelling Urinary Catheter continued, requirement due to   Reason to continue Indwelling Urinary Catheter strict Intake/Output monitoring for hemodynamic instability         Ventilator continued, requirement  due to severe respiratory failure   Ventilator Sedation RASS 0 to -2      ASSESSMENT AND PLAN SYNOPSIS  Severe and Acute hypoxemic respiratory failure due to COVID-19 pneumonia / ARDS Mechanical ventilation via ARDS protocol, target PRVC 6 cc/kg Wean PEEP and FiO2 as able Goal plateau pressure less than 30, driving pressure less than 15 Paralytics if necessary for vent synchrony, gas exchange Cycle prone positioning if necessary for oxygenation Deep sedation per PAD protocol, goal RASS -4,  Diuresis as blood pressure and renal function can tolerate, goal CVP 5-8.   diuresis as tolerated based on Kidney function VAP prevention order set Remdesivir  IV STEROIDS   Severe ACUTE Hypoxic and Hypercapnic Respiratory Failure -continue Full MV support -continue Bronchodilator Therapy -Wean Fio2 and PEEP as tolerated -VAP/VENT bundle implementation  ACUTE KIDNEY INJURY/Renal Failure -continue Foley Catheter-assess need -Avoid nephrotoxic agents -Follow urine output, BMP -Ensure adequate renal perfusion, optimize oxygenation -Renal dose medications     NEUROLOGY Acute toxic metabolic encephalopathy, need for sedation Goal RASS -2 to -3  SHOCK-SEPSIS-present on admission to ICU -use vasopressors to keep MAP>65   CARDIAC ICU monitoring  ID -continue IV abx as prescibed -follow up cultures  GI GI PROPHYLAXIS as indicated   DIET-->TF's as tolerated Constipation protocol as indicated  ENDO - will use ICU hypoglycemic\Hyperglycemia protocol if indicated     ELECTROLYTES -follow labs as needed -replace as needed -pharmacy consultation and following   DVT/GI PRX ordered and assessed TRANSFUSIONS AS NEEDED MONITOR FSBS I Assessed the need for Labs I Assessed the need for Foley I Assessed the need for Central Venous Line Family Discussion when available I Assessed the need for Mobilization I made an Assessment of medications to be adjusted accordingly Safety  Risk assessment completed  CASE DISCUSSED IN MULTIDISCIPLINARY ROUNDS WITH ICU TEAM  Critical Care Time devoted to patient care services described in this note is 47 minutes.   Overall, patient is critically ill, prognosis is guarded.  Patient with Multiorgan failure and at high risk for cardiac arrest and death.    Corrin Parker, M.D.  [facility]  Medical Director [place] Director [facility] Cardio-Pulmonary Department

## 2020-06-17 NOTE — Progress Notes (Signed)
Dylan Sullivan [facility]) showed up to see patient and discuss end of life with MD as patient has a DNR order in chart and specific instructions on care.  Went over visitor policy in detail with visitor.  Ms. Dylan Sullivan became upset about visiting policy and explained if patient is end of life or actively dying visiting would be more frequent.  POA never got to speak with MD.

## 2020-06-17 NOTE — Progress Notes (Signed)
   06/15/20 1957  Assess: MEWS Score  Temp 97.6 F (36.4 C)  BP (!) 159/74  Pulse Rate (!) 127  ECG Heart Rate (!) 120  Resp 20  SpO2 (!) 88 %  O2 Device Nasal Cannula  Assess: MEWS Score  MEWS Temp 0  MEWS Systolic 0  MEWS Pulse 2  MEWS RR 0  MEWS LOC 0  MEWS Score 2  MEWS Score Color Yellow  Assess: if the MEWS score is Yellow or Red  Were vital signs taken at a resting state? Yes  Focused Assessment No change from prior assessment  Early Detection of Sepsis Score *See Row Information* Low  MEWS guidelines implemented *See Row Information* Yes  Treat  MEWS Interventions Administered prn meds/treatments;Administered scheduled meds/treatments  Pain Scale 0-10  Pain Score 0  Take Vital Signs  Increase Vital Sign Frequency  Yellow: Q 2hr X 2 then Q 4hr X 2, if remains yellow, continue Q 4hrs  Escalate  MEWS: Escalate Yellow: discuss with charge nurse/RN and consider discussing with provider and RRT  Notify: Charge Nurse/RN  Name of Charge Nurse/RN Notified Langston Reusing, RN  Date Charge Nurse/RN Notified 06/15/20  Time Charge Nurse/RN Notified 2350  Inserted for [employer] RN

## 2020-06-17 NOTE — Progress Notes (Signed)
Pt's CXR this morning showing that the ETT has retracted into the subglottic airway, approximately 12 cm above the carina.  ETT noted to be @ 25 cm at the lip.  Glidescope was used to visualize the ETT and cords.  Tip of the ETT was extending through the vocal cords, but the balloon was noted to be slightly above the cords.   The balloon was deflated and the tube advanced to 30 cm (nearly hubbed).  O2 sats improved.  Will obtain repeat CXR post repositioning.      Harlon Ditty, AGACNP-BC [facility] Pager: [phone]

## 2020-06-17 NOTE — Progress Notes (Signed)
GOALS OF CARE DISCUSSION  The Clinical status was relayed to family in detail-[facility]  Updated and notified of patients medical condition.  Patient remains unresponsive and will not open eyes to command.   Upon assessment his breath sounds are course crackles with significant secretions to oral pharyngeal region.   Patient is having a weak cough and struggling to remove secretions.   patient with increased WOB and using accessory muscles to breathe Explained to family course of therapy and the modalities     Patient with Progressive multiorgan failure with very low chance of meaningful recovery despite all aggressive and optimal medical therapy. Patient is in the Dying  Process associated with Suffering.  Family understands the situation.  They have consented and agreed to DNR/DNI and would like to proceed with Comfort care measures when all family is notified and arrives to visit  HCPOA is satisfied with Plan of action and management. All questions answered  Additional CC time 32 mins   Aariya Ferrick Santiago Glad, M.D.  [facility]  Medical Director [employer] [facility] Medical Director [facility] Cardio-Pulmonary Department

## 2020-06-17 NOTE — Progress Notes (Signed)
Per Dr Belia Heman, hold PICC for now. Will reorder it if needed.

## 2020-06-18 ENCOUNTER — Inpatient Hospital Stay: Payer: Medicare Other

## 2020-06-18 DIAGNOSIS — U071 COVID-19: Secondary | ICD-10-CM | POA: Diagnosis not present

## 2020-06-18 DIAGNOSIS — N390 Urinary tract infection, site not specified: Secondary | ICD-10-CM | POA: Diagnosis not present

## 2020-06-18 DIAGNOSIS — A419 Sepsis, unspecified organism: Secondary | ICD-10-CM | POA: Diagnosis not present

## 2020-06-18 LAB — COMPREHENSIVE METABOLIC PANEL
ALT: 10 U/L (ref 0–44)
AST: 123 U/L — ABNORMAL HIGH (ref 15–41)
Albumin: 1.9 g/dL — ABNORMAL LOW (ref 3.5–5.0)
Alkaline Phosphatase: 38 U/L (ref 38–126)
Anion gap: 13 (ref 5–15)
BUN: 34 mg/dL — ABNORMAL HIGH (ref 8–23)
CO2: 22 mmol/L (ref 22–32)
Calcium: 8.2 mg/dL — ABNORMAL LOW (ref 8.9–10.3)
Chloride: 99 mmol/L (ref 98–111)
Creatinine, Ser: 3.09 mg/dL — ABNORMAL HIGH (ref 0.61–1.24)
GFR, Estimated: 20 mL/min — ABNORMAL LOW (ref >60)
Glucose, Bld: 122 mg/dL — ABNORMAL HIGH (ref 70–99)
Potassium: 4.3 mmol/L (ref 3.5–5.1)
Sodium: 134 mmol/L — ABNORMAL LOW (ref 135–145)
Total Bilirubin: 0.6 mg/dL (ref 0.3–1.2)
Total Protein: 5.5 g/dL — ABNORMAL LOW (ref 6.5–8.1)

## 2020-06-18 LAB — CBC
HCT: 36.4 % — ABNORMAL LOW (ref 39.0–52.0)
Hemoglobin: 12.5 g/dL — ABNORMAL LOW (ref 13.0–17.0)
MCH: 33.2 pg (ref 26.0–34.0)
MCHC: 34.3 g/dL (ref 30.0–36.0)
MCV: 96.6 fL (ref 80.0–100.0)
Platelets: 379 10*3/uL (ref 150–400)
RBC: 3.77 MIL/uL — ABNORMAL LOW (ref 4.22–5.81)
RDW: 12.9 % (ref 11.5–15.5)
WBC: 13.1 10*3/uL — ABNORMAL HIGH (ref 4.0–10.5)
nRBC: 0 % (ref 0.0–0.2)

## 2020-06-18 LAB — HEMOGLOBIN A1C
Hgb A1c MFr Bld: 5.4 % (ref 4.8–5.6)
Mean Plasma Glucose: 108.28 mg/dL

## 2020-06-18 LAB — D-DIMER, QUANTITATIVE: D-Dimer, Quant: 3.67 ug/mL-FEU — ABNORMAL HIGH (ref 0.00–0.50)

## 2020-06-18 LAB — GLUCOSE, CAPILLARY
Glucose-Capillary: 113 mg/dL — ABNORMAL HIGH (ref 70–99)
Glucose-Capillary: 120 mg/dL — ABNORMAL HIGH (ref 70–99)
Glucose-Capillary: 156 mg/dL — ABNORMAL HIGH (ref 70–99)

## 2020-06-18 LAB — FERRITIN: Ferritin: 1491 ng/mL — ABNORMAL HIGH (ref 24–336)

## 2020-06-18 LAB — C-REACTIVE PROTEIN: CRP: 20.9 mg/dL — ABNORMAL HIGH (ref <1.0)

## 2020-06-18 MED ORDER — CEFAZOLIN SODIUM-DEXTROSE 2-4 GM/100ML-% IV SOLN
2.0000 g | Freq: Two times a day (BID) | INTRAVENOUS | Status: DC
  Administered 2020-06-18 – 2020-06-20 (×4): 2 g via INTRAVENOUS
  Filled 2020-06-18 (×8): qty 100

## 2020-06-18 MED ORDER — FAMOTIDINE IN NACL 20-0.9 MG/50ML-% IV SOLN
20.0000 mg | INTRAVENOUS | Status: DC
  Administered 2020-06-19 – 2020-06-20 (×2): 20 mg via INTRAVENOUS
  Filled 2020-06-18 (×3): qty 50

## 2020-06-18 MED ORDER — VITAL HIGH PROTEIN PO LIQD
1000.0000 mL | ORAL | Status: DC
  Administered 2020-06-18: 20:00:00 1000 mL

## 2020-06-18 MED ORDER — PHENYTOIN SODIUM 50 MG/ML IJ SOLN
100.0000 mg | Freq: Three times a day (TID) | INTRAMUSCULAR | Status: DC
  Administered 2020-06-18 – 2020-06-20 (×5): 100 mg via INTRAVENOUS
  Filled 2020-06-18 (×8): qty 2

## 2020-06-18 MED ORDER — SODIUM CHLORIDE 0.9 % IV SOLN: INTRAVENOUS | Status: DC

## 2020-06-18 MED ORDER — ENOXAPARIN SODIUM 30 MG/0.3ML ~~LOC~~ SOLN
30.0000 mg | SUBCUTANEOUS | Status: DC
  Administered 2020-06-18 – 2020-06-19 (×2): 30 mg via SUBCUTANEOUS
  Filled 2020-06-18: qty 0.3

## 2020-06-18 MED ORDER — INSULIN ASPART 100 UNIT/ML ~~LOC~~ SOLN
0.0000 [IU] | SUBCUTANEOUS | Status: DC
  Administered 2020-06-18 – 2020-06-19 (×2): 2 [IU] via SUBCUTANEOUS
  Administered 2020-06-19 (×2): 1 [IU] via SUBCUTANEOUS
  Filled 2020-06-18 (×4): qty 1

## 2020-06-18 MED ORDER — ADULT MULTIVITAMIN LIQUID CH
15.0000 mL | Freq: Every day | ORAL | Status: DC
  Administered 2020-06-19: 15 mL
  Filled 2020-06-18: qty 15

## 2020-06-18 MED ORDER — SODIUM CHLORIDE 0.9 % IV SOLN
750.0000 mg | Freq: Two times a day (BID) | INTRAVENOUS | Status: DC
  Administered 2020-06-18 – 2020-06-20 (×3): 750 mg via INTRAVENOUS
  Filled 2020-06-18 (×8): qty 7.5

## 2020-06-18 NOTE — Progress Notes (Signed)
Initial Nutrition Assessment  DOCUMENTATION CODES:   Not applicable  INTERVENTION:   Once OGT in place and confirmed in good position:  Vital HP @55ml /hr- Initiate at 37ml/hr and increase by 58ml/hr q 8 hours until goal rate is reached.   Propofol: 21.6 ml/hr- provides 570kcal/day   Free water flushes 58ml q4 hours to maintain tube patency   Regimen provides 1320kcal/day, 116g/day protein and 1260ml/day free water. With current propofol rate provides 1890kcal/day.   Liquid MVI daily via tube  Pt at high refeed risk; recommend monitor potassium, magnesium and phosphorus labs daily until stable  NUTRITION DIAGNOSIS:   Inadequate oral intake related to inability to eat (pt sedated and ventilated) as evidenced by NPO status.  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 Plan is for OGT placement and tube feeds today. Pt is likely at refeed risk. Suspect pt with decreased appetite and oral intake pta r/t COVID 19. Per chart, pt is down 8lbs(4%) in < 1 month; this is likely significant.   Medications reviewed and include: aspirin, dexamethasone, colace, lovenox, miralax, cefazolin, pepcid, fentanyl, levophed, propofol   Labs reviewed: Na 134(L), BUN 34(H), creat 3.09(H) Wbc- 13.1(H)  Patient is currently intubated on ventilator support MV: 10.0 L/min Temp (24hrs), Avg:99.8 F (37.7 C), Min:96.6 F (35.9 C), Max:100.58 F (38.1 C)  Propofol: 21.6 ml/hr- provides 570kcal/day   MAP- >21mmHg  UOP-  77m  NUTRITION - FOCUSED PHYSICAL EXAM: Unable to perform at this time   Diet Order:   Diet Order            Diet NPO time specified  Diet effective now                EDUCATION NEEDS:   No education needs have been identified at this time  Skin:  Skin Assessment: Reviewed RN Assessment  Last BM:  1/4- TYPE 7  Height:   Ht Readings from Last 1 Encounters:  05/14/2020 5\' 8"  (1.727 m)    Weight:   Wt Readings from Last 1 Encounters:  06/16/20 80 kg    Ideal Body Weight:  70 kg  BMI:  Body mass index is 26.82 kg/m.  Estimated Nutritional Needs:   Kcal:  1600kcal/day  Protein:  100-120g/day  Fluid:  1.8-2.1L/day  MS, RD, LDN Please refer to [facility] for RD and/or RD on-call/weekend/after hours pager

## 2020-06-18 NOTE — Procedures (Signed)
Central Venous Catheter Placement:TRIPLE LUMEN   Procedure: Insertion of Non-tunneled Central Venous Catheter(36556) with US guidance (76937)   Indication(s) Medication administration and Difficult access  CVP monitoring Patient receiving vesicant or irritant drug.; Patient receiving intravenous therapy for longer than 5 days.; Patient has limited or no vascular access.    Consent Risks of the procedure as well as the alternatives and risks of each were explained to the patient and/or caregiver.  Consent for the procedure was obtained and is signed in the bedside chart  Consent:emergent   Anesthesia Topical only with 1% lidocaine    Timeout Verified patient identification, verified procedure, site/side was marked, verified correct patient position, special equipment/implants available, medications/allergies/relevant history reviewed, required imaging and test results available. Patient comfort was obtained.     Sterile Technique Maximal sterile technique including full sterile barrier drape, hand hygiene, sterile gown, sterile gloves, mask, hair covering, sterile ultrasound probe cover (if used).   Hand washing performed prior to starting the procedure.    Procedure Description Area of catheter insertion was cleaned with chlorhexidine and draped in sterile fashion.  With real-time ultrasound guidance a central venous catheter was placed into the right internal jugular vein. Nonpulsatile blood flow and easy flushing noted in all ports.  The catheter was sutured in place and sterile dressing applied.   A triple lumen catheter was placed in RT Internal Jugular Vein There was good blood return, catheter caps were placed on lumens, catheter flushed easily, the line was secured and a sterile dressing and BIO-PATCH applied.    Complications/Tolerance None; patient tolerated the procedure well. Chest X-ray is ordered to verify placement     EBL Minimal    Specimen(s) None   Number of Attempts: 1 Complications:none Estimated Blood Loss: none Chest Radiograph indicated and ordered.  Operator: Che Rachal.   Cortnee Steinmiller David Mccoy Testa, M.D.  [facility]  Medical Director ICU-[facility] [place] Medical Director [facility] Cardio-Pulmonary Department     

## 2020-06-18 NOTE — Progress Notes (Signed)
CRITICAL CARE NOTE  1/4 intubated for severe COVID 19 infection and pneumonia 1/5 intubated, severe resp failure patient made DNR   CC  follow up respiratory failure  SUBJECTIVE Patient remains critically ill Prognosis is guarded   BP (!) 135/43   Pulse 76   Temp 99.86 F (37.7 C)   Resp 13   Ht 5\' 8"  (1.727 m)   Wt 80 kg   SpO2 94%   BMI 26.82 kg/m    I/O last 3 completed shifts: In: 2944.1 [I.V.:1932.4; IV Piggyback:1011.7] Out: 1890 [Urine:1890] No intake/output data recorded.  SpO2: 94 % O2 Flow Rate (L/min): 20 L/min FiO2 (%): 60 %  Estimated body mass index is 26.82 kg/m as calculated from the following:   Height as of this encounter: 5\' 8"  (1.727 m).   Weight as of this encounter: 80 kg.  SIGNIFICANT EVENTS   REVIEW OF SYSTEMS  PATIENT IS UNABLE TO PROVIDE COMPLETE REVIEW OF SYSTEMS DUE TO SEVERE CRITICAL ILLNESS      COVID-19 DISASTER DECLARATION:   FULL CONTACT PHYSICAL EXAMINATION WAS NOT POSSIBLE DUE TO TREATMENT OF COVID-19  AND CONSERVATION OF PERSONAL PROTECTIVE EQUIPMENT, LIMITED EXAM FINDINGS INCLUDE-   PHYSICAL EXAMINATION:  GENERAL:critically ill appearing, +resp distress NEUROLOGIC: obtunded, GCS<8   Patient assessed or the symptoms described in the history of present illness.  In the context of the Global COVID-19 pandemic, which necessitated consideration that the patient might be at risk for infection with the SARS-CoV-2 virus that causes COVID-19, Institutional protocols and algorithms that pertain to the evaluation of patients at risk for COVID-19 are in a state of rapid change based on information released by regulatory bodies including the CDC and federal and state organizations. These policies and algorithms were followed during the patient's care while in hospital.    MEDICATIONS: I have reviewed all medications and confirmed regimen as documented   CULTURE RESULTS   Recent Results (from the past 240 hour(s))   Culture, blood (Routine x 2)     Status: None   Collection Time: 05/29/2020 10:13 PM   Specimen: BLOOD  Result Value Ref Range Status   Specimen Description BLOOD BLOOD RIGHT FOREARM  Final   Special Requests   Final    BOTTLES DRAWN AEROBIC AND ANAEROBIC Blood Culture adequate volume   Culture   Final    NO GROWTH 5 DAYS Performed at [facility], [address], [place], [place] [address]    Report Status 06/17/2020 FINAL  Final  Culture, blood (Routine x 2)     Status: Abnormal   Collection Time: 05/31/2020 10:30 PM   Specimen: BLOOD  Result Value Ref Range Status   Specimen Description   Final    BLOOD BLOOD LEFT ARM Performed at [facility], [address], [place], [place] [address]    Special Requests   Final    BOTTLES DRAWN AEROBIC AND ANAEROBIC Blood Culture results may not be optimal due to an inadequate volume of blood received in culture bottles Performed at [facility], [address] [place], [place] [address]    Culture  Setup Time   Final    [place] CRITICAL RESULT CALLED TO, READ BACK BY AND VERIFIED WITH: NATHAN BLEUE @0237  ON 06/14/20 SKL    Culture (A)  Final    STAPHYLOCOCCUS EPIDERMIDIS THE SIGNIFICANCE OF ISOLATING THIS ORGANISM FROM A SINGLE SET OF BLOOD CULTURES WHEN MULTIPLE SETS ARE DRAWN IS UNCERTAIN. PLEASE NOTIFY [place]  WITHIN ONE WEEK IF SPECIATION AND SENSITIVITIES ARE REQUIRED. Performed at [facility], [place] [address] [place], [place] [address]    Report Status 06/15/2020 FINAL  Final  Blood Culture ID Panel (Reflexed)     Status: Abnormal   Collection Time: 06/11/2020 10:30 PM  Result Value Ref Range Status   Enterococcus faecalis NOT DETECTED NOT DETECTED Final   Enterococcus Faecium NOT DETECTED NOT DETECTED Final   Listeria monocytogenes NOT DETECTED NOT DETECTED Final   Staphylococcus species DETECTED (A) NOT DETECTED Final    Comment: CRITICAL  RESULT CALLED TO, READ BACK BY AND VERIFIED WITH: NATHAN BELUE @0237  ON 06/14/20 SKL    Staphylococcus aureus (BCID) NOT DETECTED NOT DETECTED Final   Staphylococcus epidermidis DETECTED (A) NOT DETECTED Final    Comment: CRITICAL RESULT CALLED TO, READ BACK BY AND VERIFIED WITH: NATHAN BELUE @0237  ON 06/14/20 SKL    Staphylococcus lugdunensis NOT DETECTED NOT DETECTED Final   Streptococcus species NOT DETECTED NOT DETECTED Final   Streptococcus agalactiae NOT DETECTED NOT DETECTED Final   Streptococcus pneumoniae NOT DETECTED NOT DETECTED Final   Streptococcus pyogenes NOT DETECTED NOT DETECTED Final   A.calcoaceticus-baumannii NOT DETECTED NOT DETECTED Final   Bacteroides fragilis NOT DETECTED NOT DETECTED Final   Enterobacterales NOT DETECTED NOT DETECTED Final   Enterobacter cloacae complex NOT DETECTED NOT DETECTED Final   Escherichia coli NOT DETECTED NOT DETECTED Final   Klebsiella aerogenes NOT DETECTED NOT DETECTED Final   Klebsiella oxytoca NOT DETECTED NOT DETECTED Final   Klebsiella pneumoniae NOT DETECTED NOT DETECTED Final   Proteus species NOT DETECTED NOT DETECTED Final   Salmonella species NOT DETECTED NOT DETECTED Final   Serratia marcescens NOT DETECTED NOT DETECTED Final   Haemophilus influenzae NOT DETECTED NOT DETECTED Final   Neisseria meningitidis NOT DETECTED NOT DETECTED Final   Pseudomonas aeruginosa NOT DETECTED NOT DETECTED Final   Stenotrophomonas maltophilia NOT DETECTED NOT DETECTED Final   Candida albicans NOT DETECTED NOT DETECTED Final   Candida auris NOT DETECTED NOT DETECTED Final   Candida glabrata NOT DETECTED NOT DETECTED Final   Candida krusei NOT DETECTED NOT DETECTED Final   Candida parapsilosis NOT DETECTED NOT DETECTED Final   Candida tropicalis NOT DETECTED NOT DETECTED Final   Cryptococcus neoformans/gattii NOT DETECTED NOT DETECTED Final   Methicillin resistance mecA/C NOT DETECTED NOT DETECTED Final    Comment: Performed at [facility], [address] [place], [place] [address]  Urine culture     Status: Abnormal   Collection Time: 05/25/2020 10:37 PM   Specimen: Urine, Random  Result Value Ref Range Status   Specimen Description   Final    URINE, RANDOM Performed at [facility], [address] [place], [place] [address]    Special Requests   Final    NONE Performed at [facility], [address] [place],  [address]    Culture >=100,000 COLONIES/mL STAPHYLOCOCCUS EPIDERMIDIS (A)  Final   Report Status 06/15/2020 FINAL  Final   Organism ID, Bacteria STAPHYLOCOCCUS EPIDERMIDIS (A)  Final      Susceptibility   Staphylococcus epidermidis - MIC*    CIPROFLOXACIN >=8 RESISTANT Resistant     GENTAMICIN <=0.5 SENSITIVE Sensitive     NITROFURANTOIN 32 SENSITIVE Sensitive     OXACILLIN <=0.25 SENSITIVE Sensitive     TETRACYCLINE <=1 SENSITIVE Sensitive     VANCOMYCIN <=0.5 SENSITIVE Sensitive     TRIMETH/SULFA <=10 SENSITIVE Sensitive     CLINDAMYCIN <=0.25  SENSITIVE Sensitive     RIFAMPIN <=0.5 SENSITIVE Sensitive     Inducible Clindamycin NEGATIVE Sensitive     * >=100,000 COLONIES/mL STAPHYLOCOCCUS EPIDERMIDIS  Resp Panel by RT-PCR (Flu A&B, Covid) Nasopharyngeal Swab     Status: Abnormal   Collection Time: 06/13/20  9:18 AM   Specimen: Nasopharyngeal Swab; Nasopharyngeal(NP) swabs in vial transport medium  Result Value Ref Range Status   SARS Coronavirus 2 by RT PCR POSITIVE (A) NEGATIVE Final    Comment: RESULT CALLED TO, READ BACK BY AND VERIFIED WITH: ANGELA ROBBINS 06/13/20 AT 1103 BY ACR (NOTE) SARS-CoV-2 target nucleic acids are DETECTED.  The SARS-CoV-2 RNA is generally detectable in upper respiratory specimens during the acute phase of infection. Positive results are indicative of the presence of the identified virus, but do not rule out bacterial infection or co-infection with other pathogens not detected by the test. Clinical correlation with patient history  and other diagnostic information is necessary to determine patient infection status. The expected result is Negative.  Fact Sheet for Patients: BloggerCourse.com  Fact Sheet for Healthcare Providers: SeriousBroker.it  This test is not yet approved or cleared by the [place] FDA and  has been authorized for detection and/or diagnosis of SARS-CoV-2 by FDA under an Emergency Use Authorization (EUA).  This EUA will remain in effect (meaning this test can  be used) for the duration of  the COVID-19 declaration under Section 564(b)(1) of the Act, 21 U.S.C. section 360bbb-3(b)(1), unless the authorization is terminated or revoked sooner.     Influenza A by PCR NEGATIVE NEGATIVE Final   Influenza B by PCR NEGATIVE NEGATIVE Final    Comment: (NOTE) The Xpert Xpress SARS-CoV-2/FLU/RSV plus assay is intended as an aid in the diagnosis of influenza from Nasopharyngeal swab specimens and should not be used as a sole basis for treatment. Nasal washings and aspirates are unacceptable for Xpert Xpress SARS-CoV-2/FLU/RSV testing.  Fact Sheet for Patients: BloggerCourse.com  Fact Sheet for Healthcare Providers: SeriousBroker.it  This test is not yet approved or cleared by the [place] FDA and has been authorized for detection and/or diagnosis of SARS-CoV-2 by FDA under an Emergency Use Authorization (EUA). This EUA will remain in effect (meaning this test can be used) for the duration of the COVID-19 declaration under Section 564(b)(1) of the Act, 21 U.S.C. section 360bbb-3(b)(1), unless the authorization is terminated or revoked.  Performed at [facility], [address] [place], [place] [address]   CULTURE, BLOOD (ROUTINE X 2) w Reflex to ID Panel     Status: None (Preliminary result)   Collection Time: 06/14/20  8:37 AM   Specimen: BLOOD  Result Value Ref Range  Status   Specimen Description BLOOD LEFT HAND  Final   Special Requests   Final    BOTTLES DRAWN AEROBIC AND ANAEROBIC Blood Culture adequate volume   Culture   Final    NO GROWTH 4 DAYS Performed at [facility], [address] [place], [place] [address]    Report Status PENDING  Incomplete  CULTURE, BLOOD (ROUTINE X 2) w Reflex to ID Panel     Status: None (Preliminary result)   Collection Time: 06/14/20  8:38 AM   Specimen: BLOOD  Result Value Ref Range Status   Specimen Description BLOOD LEFT HAND  Final   Special Requests   Final    BOTTLES DRAWN AEROBIC AND ANAEROBIC Blood Culture adequate volume   Culture   Final    NO GROWTH 4 DAYS Performed  at [facility], [address] [place], [place] [address]    Report Status PENDING  Incomplete  MRSA PCR Screening     Status: None   Collection Time: 06/16/20  6:52 PM   Specimen: Nasal Mucosa; Nasopharyngeal  Result Value Ref Range Status   MRSA by PCR NEGATIVE NEGATIVE Final    Comment:        The GeneXpert MRSA Assay (FDA approved for NASAL specimens only), is one component of a comprehensive MRSA colonization surveillance program. It is not intended to diagnose MRSA infection nor to guide or monitor treatment for MRSA infections. Performed at [facility], [address] [place], [place] [address]          Indwelling Urinary Catheter continued, requirement due to   Reason to continue Indwelling Urinary Catheter strict Intake/Output monitoring for hemodynamic instability         Ventilator continued, requirement due to severe respiratory failure   Ventilator Sedation RASS 0 to -2      ASSESSMENT AND PLAN SYNOPSIS  Acute hypoxemic respiratory failure due to COVID-19 pneumonia / ARDS Mechanical ventilation via ARDS protocol, target PRVC 6 cc/kg Wean PEEP and FiO2 as able Goal plateau pressure less than 30, driving pressure less than 15 Paralytics if necessary for vent  synchrony, gas exchange Cycle prone positioning if necessary for oxygenation Deep sedation per PAD protocol, goal RASS -4,  Diuresis as blood pressure and renal function can tolerate, goal CVP 5-8.   diuresis as tolerated based on Kidney function VAP prevention order set Remdesivir  IV STEROIDS      Severe ACUTE Hypoxic and Hypercapnic Respiratory Failure -continue Full MV support -continue Bronchodilator Therapy -Wean Fio2 and PEEP as tolerated -VAP/VENT bundle implementation    ACUTE KIDNEY INJURY/Renal Failure -continue Foley Catheter-assess need -Avoid nephrotoxic agents -Follow urine output, BMP -Ensure adequate renal perfusion, optimize oxygenation -Renal dose medications     NEUROLOGY Acute toxic metabolic encephalopathy, need for sedation Goal RASS -2 to -3    CARDIAC ICU monitoring  ID -continue IV abx as prescibed -follow up cultures  GI GI PROPHYLAXIS as indicated   DIET-->npo Constipation protocol as indicated  ENDO - will use ICU hypoglycemic\Hyperglycemia protocol if indicated     ELECTROLYTES -follow labs as needed -replace as needed -pharmacy consultation and following   DVT/GI PRX ordered and assessed TRANSFUSIONS AS NEEDED MONITOR FSBS I Assessed the need for Labs I Assessed the need for Foley I Assessed the need for Central Venous Line Family Discussion when available I Assessed the need for Mobilization I made an Assessment of medications to be adjusted accordingly Safety Risk assessment completed   CASE DISCUSSED IN MULTIDISCIPLINARY ROUNDS WITH ICU TEAM  Critical Care Time devoted to patient care services described in this note is 55 minutes.   Overall, patient is critically ill, prognosis is guarded.  Patient with Multiorgan failure and at high risk for cardiac arrest and death.   Plan for comfort care measures   Patsey Pitstick Patricia Pesa, M.D.  [facility]  Medical Director [facility] Cardio-Pulmonary Department

## 2020-06-18 NOTE — Progress Notes (Signed)
OT Cancellation Note  Patient Details Name: Dylan Sullivan MRN: [id number] DOB: 1945/07/04   Cancelled Treatment:    Reason Eval/Treat Not Completed: Medical issues which prohibited therapy  Upon chart review this date, noted that pt transferred to CCU d/t increased oxygen requirements on 06/15/2020. Will complete OT order at this time. Pleas re-consult should pt become appropriate to participate. Thank you for involving occupational therapy services in the care of this patient.   Rejeana Brock, MS, OTR/L ascom [phone] 06/18/20, 8:58 AM

## 2020-06-18 NOTE — Consult Note (Signed)
[facility]  CONSULT NOTE    Date: 06/18/2020                  Patient Name:  Dylan Sullivan  MRN: [id number]  DOB: Oct 22, 1945  Age / Sex: 75 y.o., male         PCP: Casilda Carls, MD                 Service Requesting Consult: Dr. Mortimer Fries                 Reason for Consult: Acute kidney injury            History of Present Illness: Dylan Sullivan was admitted on 12/31 with COVID-19 after feeling weak, tired and several falls. He was febrile with SIRS. Patient intubated on 1/4 for worsening respiratory function. He was then placed on comfort care measures on 1/5. However family has reversed their decision and asks for nephrology consultation for his acute kidney injury.  Decisions are made by Maye Hides, patient's friend.  Urine output of 443m. Currently with indwelling foley with clear yellow urine.  Patient had CT chest angiogram on 1/3.  Creatinine today is 3.09 from yesterday's 0.9 with history of normal GFR.   Medications: Outpatient medications: Medications Prior to Admission  Medication Sig Dispense Refill Last Dose  . acidophilus (RISAQUAD) CAPS capsule Take 1 capsule by mouth 2 (two) times daily.    06/11/2020 at 1800  . aspirin EC 81 MG tablet Take 81 mg by mouth daily.   05/23/2020 at 0800  . cholecalciferol (VITAMIN D3) 25 MCG (1000 UNIT) tablet Take 2,000 Units by mouth daily.   05/30/2020 at 0800  . Coenzyme Q10 (COQ10) 200 MG CAPS Take 1 capsule by mouth daily.   05/28/2020 at 0800  . CRANBERRY PO Take 1 capsule by mouth daily.    05/30/2020 at 0800  . finasteride (PROSCAR) 5 MG tablet Take 1 tablet (5 mg total) by mouth daily. 30 tablet 12 06/08/2020 at 1800  . Flaxseed Oil (LINSEED OIL) OIL Take 142mby mouth daily   06/11/2020 at 0800  . levETIRAcetam (KEPPRA) 750 MG tablet Take 1 tablet (750 mg total) by mouth 2 (two) times daily. 60 tablet 0 05/13/2020 at 1800  . OVER THE COUNTER MEDICATION Take 29 mg by mouth daily. Black Strap  Molasses   06/11/2020 at 0800  . phenytoin (DILANTIN) 100 MG ER capsule Take 300 mg by mouth at bedtime.    06/11/2020 at 1800  . tamsulosin (FLOMAX) 0.4 MG CAPS capsule Take 1 capsule (0.4 mg total) by mouth daily. 30 capsule 12 05/27/2020 at 1800  . triamcinolone cream (KENALOG) 0.1 % Apply topically 2 (two) times daily.   06/04/2020 at Unknown time  . Turmeric 500 MG CAPS Take 500 mg by mouth 2 (two) times daily.    06/11/2020 at 1800  . vitamin C (ASCORBIC ACID) 500 MG tablet Take 1,000 mg by mouth 2 (two) times daily.    06/03/2020 at 0800  . Black Pepper-Turmeric 3-500 MG CAPS Take by mouth. (Patient not taking: Reported on 06/13/2020)   Not Taking at Unknown time  . CAPSICUM, CAYENNE, PO Take 25 mg by mouth daily.  (Patient not taking: Reported on 06/13/2020)   Not Taking at Unknown time  . Cholecalciferol (VITAMIN D3) 1.25 MG (50000 UT) CAPS Take 1 capsule by mouth once a week. (Patient not taking: No sig reported)   Not Taking at Unknown time  .  Misc Natural Products (ESTROVEN + ENERGY MAX STRENGTH) TABS Take by mouth. (Patient not taking: Reported on 06/13/2020)   Not Taking at Unknown time  . OVER THE COUNTER MEDICATION Take 1 capsule by mouth daily. chanca piedra  (Patient not taking: Reported on 06/13/2020)   Not Taking at Unknown time    Current medications: Current Facility-Administered Medications  Medication Dose Route Frequency Provider Last Rate Last Admin  . 0.9 %  sodium chloride infusion   Intravenous PRN Wyvonnia Dusky, MD 10 mL/hr at 06/17/20 0600 Infusion Verify at 06/17/20 0600  . 0.9 %  sodium chloride infusion  250 mL Intravenous Continuous Darel Hong D, NP 10 mL/hr at 06/17/20 0600 Infusion Verify at 06/17/20 0600  . 0.9 %  sodium chloride infusion   Intravenous Continuous Darel Hong D, NP      . acetaminophen (TYLENOL) tablet 650 mg  650 mg Oral Q6H PRN Vernelle Emerald, MD   650 mg at 06/15/20 0800   Or  . acetaminophen (TYLENOL) suppository 650 mg  650 mg  Rectal Q6H PRN Vernelle Emerald, MD   650 mg at 06/16/20 0201  . aspirin EC tablet 81 mg  81 mg Oral Daily Wyvonnia Dusky, MD   81 mg at 06/15/20 1646  . ceFAZolin (ANCEF) IVPB 2g/100 mL premix  2 g Intravenous Q12H Flora Lipps, MD      . chlorhexidine gluconate (MEDLINE KIT) (PERIDEX) 0.12 % solution 15 mL  15 mL Mouth Rinse BID Ottie Glazier, MD   15 mL at 06/18/20 0731  . Chlorhexidine Gluconate Cloth 2 % PADS 6 each  6 each Topical Daily Ottie Glazier, MD   6 each at 06/17/20 2131  . dexamethasone (DECADRON) injection 6 mg  6 mg Intravenous Q24H Wyvonnia Dusky, MD   6 mg at 06/18/20 0845  . docusate (COLACE) 50 MG/5ML liquid 100 mg  100 mg Per Tube BID Darel Hong D, NP      . enoxaparin (LOVENOX) injection 30 mg  30 mg Subcutaneous Q24H Flora Lipps, MD   30 mg at 06/18/20 0846  . [START ON 06/19/2020] famotidine (PEPCID) IVPB 20 mg premix  20 mg Intravenous Q24H Flora Lipps, MD      . feeding supplement (VITAL HIGH PROTEIN) liquid 1,000 mL  1,000 mL Per Tube Continuous Flora Lipps, MD      . fentaNYL (SUBLIMAZE) bolus via infusion 25 mcg  25 mcg Intravenous Q15 min PRN Darel Hong D, NP      . fentaNYL 2568mg in NS 25106m(1037mml) infusion-PREMIX  0-400 mcg/hr Intravenous Continuous AleOttie GlazierD 17.5 mL/hr at 06/18/20 1238 175 mcg/hr at 06/18/20 1238  . ibuprofen (ADVIL) tablet 400 mg  400 mg Oral Q4H PRN Mansy, Jan A, MD      . insulin aspart (novoLOG) injection 0-9 Units  0-9 Units Subcutaneous Q4H KeeDarel Hong NP   2 Units at 06/18/20 1643  . levETIRAcetam (KEPPRA) 750 mg in sodium chloride 0.9 % 100 mL IVPB  750 mg Intravenous Q12H Kasa, KurMaretta BeesD      . MEDLINE mouth rinse  15 mL Mouth Rinse 10 times per day AleOttie GlazierD   15 mL at 06/18/20 1605  . metoprolol tartrate (LOPRESSOR) injection 5 mg  5 mg Intravenous Q6H PRN WilWyvonnia DuskyD   5 mg at 06/16/20 1800  . midazolam (VERSED) injection 1 mg  1 mg Intravenous Q15 min PRN KeeBradly BienenstockP      .  midazolam (VERSED) injection 1 mg  1 mg Intravenous Q2H PRN Bradly Bienenstock, NP   2 mg at 06/16/20 1912  . morphine 2 MG/ML injection 1 mg  1 mg Intravenous Q4H PRN Wyvonnia Dusky, MD   1 mg at 06/16/20 1810  . [START ON 06/19/2020] multivitamin liquid 15 mL  15 mL Per Tube Daily Kasa, Kurian, MD      . norepinephrine (LEVOPHED) 20m in 2519mpremix infusion  2-10 mcg/min Intravenous Titrated KeDarel Hong, NP 15 mL/hr at 06/18/20 1603 4 mcg/min at 06/18/20 1603  . ondansetron (ZOFRAN) tablet 4 mg  4 mg Oral Q6H PRN Shalhoub, GeSherryll BurgerMD       Or  . ondansetron (ZBerks Center For Digestive Healthinjection 4 mg  4 mg Intravenous Q6H PRN Shalhoub, GeSherryll BurgerMD      . phenytoin (DILANTIN) injection 100 mg  100 mg Intravenous Q8H Kasa, Kurian, MD      . polyethylene glycol (MIRALAX / GLYCOLAX) packet 17 g  17 g Oral Daily PRN Shalhoub, GeSherryll BurgerMD      . polyethylene glycol (MIRALAX / GLYCOLAX) packet 17 g  17 g Per Tube Daily KeDarel Hong, NP      . propofol (DIPRIVAN) 1000 MG/100ML infusion  5-80 mcg/kg/min Intravenous Titrated AlOttie GlazierMD 21.6 mL/hr at 06/18/20 1603 45 mcg/kg/min at 06/18/20 1603  . remdesivir 100 mg in sodium chloride 0.9 % 100 mL IVPB  100 mg Intravenous Daily ShLu DuffelRPH 200 mL/hr at 06/18/20 0844 100 mg at 06/18/20 0844  . vecuronium (NORCURON) injection 10 mg  10 mg Intravenous Q1H PRN KeDarel Hong, NP   10 mg at 06/16/20 2010      Allergies: Allergies  Allergen Reactions  . Oxycodone-Acetaminophen Other (See Comments)    Causes hyperness      Past Medical History: Past Medical History:  Diagnosis Date  . BP (high blood pressure) 04/16/2015  . BPH (benign prostatic hyperplasia)   . BPH with obstruction/lower urinary tract symptoms 04/20/2015  . Cancer (HCTimonium   skin  . Carotid artery stenosis 11/11/2016  . Cerebral palsy (HC[facility] [place]  . Elevated PSA   . Hematuria   . Hemiplegia (HCL[place]4/07/2011   Overview:  Upper extremity most  affected   . History of elevated PSA 04/20/2015  . Hydronephrosis   . Kidney stone   . Nocturia   . Obstructive apnea 12/29/2014  . Osteoporosis   . Partial epilepsy with impairment of consciousness (HCStateline4/07/2011   [id number] none since starting keppra  . Right hemiplegia (HCPaola4/07/2011  . Seizures (HCSouth La[place]   1956-62. esolved after brain surgery  . Sleep apnea    CPAP  . Urinary retention      Past Surgical History: Past Surgical History:  Procedure Laterality Date  . APPENDECTOMY    . arm surgery Right    child  . BRStryker. CATARACT EXTRACTION W/PHACO Left 09/13/2017   Procedure: CATARACT EXTRACTION PHACO AND INTRAOCULAR LENS PLACEMENT (IOEnetaiLEFT;  Surgeon: BrLeandrew KoyanagiMD;  [facility] Service: Ophthalmology;  Laterality: Left;  sleep apnea  . CATARACT EXTRACTION W/PHACO Right 11/28/2017   Procedure: CATARACT EXTRACTION PHACO AND INTRAOCULAR LENS PLACEMENT (IOC);  Surgeon: BrLeandrew KoyanagiMD;  Location: [facility];  Service: Ophthalmology;  Laterality: Right;  [place]0:55 AP% 9.2 CDE 5.04 Fluid pack lot # [id number]  . LEG SURGERY Right    child  . TONSILLECTOMY  Family History: Family History  Problem Relation Age of Onset  . Breast cancer Maternal Aunt   . Dementia Mother   . CAD Father   . Prostate cancer Neg Hx   . Bladder Cancer Neg Hx   . Kidney cancer Neg Hx      Social History: Social History   Socioeconomic History  . Marital status: Single    Spouse name: Not on file  . Number of children: Not on file  . Years of education: Not on file  . Highest education level: Not on file  Occupational History  . Not on file  Tobacco Use  . Smoking status: Never Smoker  . Smokeless tobacco: Never Used  Vaping Use  . Vaping Use: Never used  Substance and Sexual Activity  . Alcohol use: No    Alcohol/week: 0.0 standard drinks  . Drug use: No  . Sexual activity: Not on file  Other Topics Concern  . Not on file   Social History Narrative  . Not on file   Social Determinants of Health   Financial Resource Strain: Not on file  Food Insecurity: Not on file  Transportation Needs: Not on file  Physical Activity: Not on file  Stress: Not on file  Social Connections: Not on file  Intimate Partner Violence: Not on file     Review of Systems: Review of Systems  Unable to perform ROS: Intubated    Vital Signs: Blood pressure (!) 133/57, pulse 65, temperature (!) 96.6 F (35.9 C), resp. rate 13, height $RemoveBe'5\' 8"'lHOIJxUCk$  (1.727 m), weight 80 kg, SpO2 96 %.  Weight trends: Filed Weights   05/15/2020 2208 06/16/20 0354  Weight: 81.6 kg 80 kg    Physical Exam: General: Critically ill  Head: ETT, OGT  Eyes: Anicteric, PERRL  Neck: trachea midline  Lungs:  Diminished bilaterally. PRVC FiO2 60%  Heart: Regular rate and rhythm  Abdomen:  Soft, +bowel sounds  Extremities:  trace peripheral edema.  Neurologic: Intubated and sedated  Skin: No lesions  GU: Foley catheter with clear yellow urine     Lab results: Basic Metabolic Panel: Recent Labs  Lab 06/13/20 0636 06/14/20 0432 06/15/20 0400 06/16/20 0515 06/18/20 0258  NA 133*   < > 133* 132* 134*  K 3.2*   < > 3.7 3.4* 4.3  CL 101   < > 101 100 99  CO2 23   < > 22 20* 22  GLUCOSE 98   < > 112* 107* 122*  BUN 16   < > 16 18 34*  CREATININE 0.96   < > 0.85 0.90 3.09*  CALCIUM 8.4*   < > 7.9* 8.3* 8.2*  MG 1.7  --   --   --   --    < > = values in this interval not displayed.    Liver Function Tests: Recent Labs  Lab 06/15/20 0400 06/16/20 0515 06/18/20 0258  AST 195* 181* 123*  ALT 83* 75* 10  ALKPHOS 40 35* 38  BILITOT 0.8 0.8 0.6  PROT 5.7* 5.6* 5.5*  ALBUMIN 2.4* 2.1* 1.9*   No results for input(s): LIPASE, AMYLASE in the last 168 hours. No results for input(s): AMMONIA in the last 168 hours.  CBC: Recent Labs  Lab 05/13/2020 2213 06/13/20 0636 06/14/20 0432 06/15/20 0400 06/16/20 0515 06/18/20 0258  WBC 10.0 5.8 7.5  7.2 9.4 13.1*  NEUTROABS 8.8* 3.9  --   --   --   --   HGB 14.2 12.6* 13.1  13.0 13.0 12.5*  HCT 41.3 36.5* 36.0* 36.3* 36.2* 36.4*  MCV 96.5 96.3 92.8 93.1 92.6 96.6  PLT 253 192 254 286 326 379    Cardiac Enzymes: No results for input(s): CKTOTAL, CKMB, CKMBINDEX, TROPONINI in the last 168 hours.  BNP: Invalid input(s): POCBNP  CBG: Recent Labs  Lab 06/16/20 1842 06/17/20 0732 06/18/20 1551  GLUCAP 104* 105* 156*    Microbiology: Results for orders placed or performed during the hospital encounter of 06/04/2020  Culture, blood (Routine x 2)     Status: None   Collection Time: 05/24/2020 10:13 PM   Specimen: BLOOD  Result Value Ref Range Status   Specimen Description BLOOD BLOOD RIGHT FOREARM  Final   Special Requests   Final    BOTTLES DRAWN AEROBIC AND ANAEROBIC Blood Culture adequate volume   Culture   Final    NO GROWTH 5 DAYS Performed at [facility], [address], [place], [place] [address]    Report Status 06/17/2020 FINAL  Final  Culture, blood (Routine x 2)     Status: Abnormal   Collection Time: 05/18/2020 10:30 PM   Specimen: BLOOD  Result Value Ref Range Status   Specimen Description   Final    BLOOD BLOOD LEFT ARM Performed at [facility], [address] [place], [place] [address]    Special Requests   Final    BOTTLES DRAWN AEROBIC AND ANAEROBIC Blood Culture results may not be optimal due to an inadequate volume of blood received in culture bottles Performed at [facility], [address] [place], [place] [address]    Culture  Setup Time   Final    [place] CRITICAL RESULT CALLED TO, READ BACK BY AND VERIFIED WITH: NATHAN BLEUE $RemoveBefo'@[id number]'[email or web address]$  ON 06/14/20 SKL    Culture (A)  Final    STAPHYLOCOCCUS EPIDERMIDIS THE SIGNIFICANCE OF ISOLATING THIS ORGANISM FROM A SINGLE SET OF BLOOD CULTURES WHEN MULTIPLE SETS ARE DRAWN IS UNCERTAIN. PLEASE NOTIFY THE MICROBIOLOGY DEPARTMENT WITHIN ONE WEEK IF  SPECIATION AND SENSITIVITIES ARE REQUIRED. Performed at [facility], [place] [address] [place], [place] [address]    Report Status 06/15/2020 FINAL  Final  Blood Culture ID Panel (Reflexed)     Status: Abnormal   Collection Time: 06/11/2020 10:30 PM  Result Value Ref Range Status   Enterococcus faecalis NOT DETECTED NOT DETECTED Final   Enterococcus Faecium NOT DETECTED NOT DETECTED Final   Listeria monocytogenes NOT DETECTED NOT DETECTED Final   Staphylococcus species DETECTED (A) NOT DETECTED Final    Comment: CRITICAL RESULT CALLED TO, READ BACK BY AND VERIFIED WITH: NATHAN BELUE $RemoveBefo'@[email or web address]$  ON 06/14/20 SKL    Staphylococcus aureus (BCID) NOT DETECTED NOT DETECTED Final   Staphylococcus epidermidis DETECTED (A) NOT DETECTED Final    Comment: CRITICAL RESULT CALLED TO, READ BACK BY AND VERIFIED WITH: NATHAN BELUE $RemoveBefo'@0237'[email or web address]$  ON 06/14/20 SKL    Staphylococcus lugdunensis NOT DETECTED NOT DETECTED Final   Streptococcus species NOT DETECTED NOT DETECTED Final   Streptococcus agalactiae NOT DETECTED NOT DETECTED Final   Streptococcus pneumoniae NOT DETECTED NOT DETECTED Final   Streptococcus pyogenes NOT DETECTED NOT DETECTED Final   A.calcoaceticus-baumannii NOT DETECTED NOT DETECTED Final   Bacteroides fragilis NOT DETECTED NOT DETECTED Final   Enterobacterales NOT DETECTED NOT DETECTED Final   Enterobacter cloacae complex NOT DETECTED NOT DETECTED Final   Escherichia coli NOT DETECTED NOT DETECTED Final   Klebsiella aerogenes NOT DETECTED NOT DETECTED Final  Klebsiella oxytoca NOT DETECTED NOT DETECTED Final   Klebsiella pneumoniae NOT DETECTED NOT DETECTED Final   Proteus species NOT DETECTED NOT DETECTED Final   Salmonella species NOT DETECTED NOT DETECTED Final   Serratia marcescens NOT DETECTED NOT DETECTED Final   Haemophilus influenzae NOT DETECTED NOT DETECTED Final   Neisseria meningitidis NOT DETECTED NOT DETECTED Final   Pseudomonas aeruginosa NOT DETECTED NOT DETECTED Final    Stenotrophomonas maltophilia NOT DETECTED NOT DETECTED Final   Candida albicans NOT DETECTED NOT DETECTED Final   Candida auris NOT DETECTED NOT DETECTED Final   Candida glabrata NOT DETECTED NOT DETECTED Final   Candida krusei NOT DETECTED NOT DETECTED Final   Candida parapsilosis NOT DETECTED NOT DETECTED Final   Candida tropicalis NOT DETECTED NOT DETECTED Final   Cryptococcus neoformans/gattii NOT DETECTED NOT DETECTED Final   Methicillin resistance mecA/C NOT DETECTED NOT DETECTED Final    Comment: Performed at [facility], [address] [place], [place] [address]  Urine culture     Status: Abnormal   Collection Time: 05/15/2020 10:37 PM   Specimen: Urine, Random  Result Value Ref Range Status   Specimen Description   Final    URINE, RANDOM Performed at [facility], [address] [place], [place] [address]    Special Requests   Final    NONE Performed at [facility], [address] [place], [place] [address]    Culture >=100,000 COLONIES/mL STAPHYLOCOCCUS EPIDERMIDIS (A)  Final   Report Status 06/15/2020 FINAL  Final   Organism ID, Bacteria STAPHYLOCOCCUS EPIDERMIDIS (A)  Final      Susceptibility   Staphylococcus epidermidis - MIC*    CIPROFLOXACIN >=8 RESISTANT Resistant     GENTAMICIN <=0.5 SENSITIVE Sensitive     NITROFURANTOIN 32 SENSITIVE Sensitive     OXACILLIN <=0.25 SENSITIVE Sensitive     TETRACYCLINE <=1 SENSITIVE Sensitive     VANCOMYCIN <=0.5 SENSITIVE Sensitive     TRIMETH/SULFA <=10 SENSITIVE Sensitive     CLINDAMYCIN <=0.25 SENSITIVE Sensitive     RIFAMPIN <=0.5 SENSITIVE Sensitive     Inducible Clindamycin NEGATIVE Sensitive     * >=100,000 COLONIES/mL STAPHYLOCOCCUS EPIDERMIDIS  Resp Panel by RT-PCR (Flu A&B, Covid) Nasopharyngeal Swab     Status: Abnormal   Collection Time: 06/13/20  9:18 AM   Specimen: Nasopharyngeal Swab; Nasopharyngeal(NP) swabs in vial transport medium  Result Value Ref Range Status   SARS  Coronavirus 2 by RT PCR POSITIVE (A) NEGATIVE Final    Comment: RESULT CALLED TO, READ BACK BY AND VERIFIED WITH: ANGELA ROBBINS 06/13/20 AT 1103 BY ACR (NOTE) SARS-CoV-2 target nucleic acids are DETECTED.  The SARS-CoV-2 RNA is generally detectable in upper respiratory specimens during the acute phase of infection. Positive results are indicative of the presence of the identified virus, but do not rule out bacterial infection or co-infection with other pathogens not detected by the test. Clinical correlation with patient history and other diagnostic information is necessary to determine patient infection status. The expected result is Negative.  Fact Sheet for Patients: EntrepreneurPulse.com.au  Fact Sheet for Healthcare Providers: IncredibleEmployment.be  This test is not yet approved or cleared by the [place] FDA and  has been authorized for detection and/or diagnosis of SARS-CoV-2 by FDA under an Emergency Use Authorization (EUA).  This EUA will remain in effect (meaning this test can  be used) for the duration of  the COVID-19 declaration under Section 564(b)(1) of the Act, 21 U.S.C. section 360bbb-3(b)(1), unless the  authorization is terminated or revoked sooner.     Influenza A by PCR NEGATIVE NEGATIVE Final   Influenza B by PCR NEGATIVE NEGATIVE Final    Comment: (NOTE) The Xpert Xpress SARS-CoV-2/FLU/RSV plus assay is intended as an aid in the diagnosis of influenza from Nasopharyngeal swab specimens and should not be used as a sole basis for treatment. Nasal washings and aspirates are unacceptable for Xpert Xpress SARS-CoV-2/FLU/RSV testing.  Fact Sheet for Patients: EntrepreneurPulse.com.au  Fact Sheet for Healthcare Providers: IncredibleEmployment.be  This test is not yet approved or cleared by the [place] FDA and has been authorized for detection and/or diagnosis of SARS-CoV-2  by FDA under an Emergency Use Authorization (EUA). This EUA will remain in effect (meaning this test can be used) for the duration of the COVID-19 declaration under Section 564(b)(1) of the Act, 21 U.S.C. section 360bbb-3(b)(1), unless the authorization is terminated or revoked.  Performed at [facility] [address] [place], [place] [address]   CULTURE, BLOOD (ROUTINE X 2) w Reflex to ID Panel     Status: None (Preliminary result)   Collection Time: 06/14/20  8:37 AM   Specimen: BLOOD  Result Value Ref Range Status   Specimen Description BLOOD LEFT HAND  Final   Special Requests   Final    BOTTLES DRAWN AEROBIC AND ANAEROBIC Blood Culture adequate volume   Culture   Final    NO GROWTH 4 DAYS Performed at [facility], [address], [place], [place] [address]    Report Status PENDING  Incomplete  CULTURE, BLOOD (ROUTINE X 2) w Reflex to ID Panel     Status: None (Preliminary result)   Collection Time: 06/14/20  8:38 AM   Specimen: BLOOD  Result Value Ref Range Status   Specimen Description BLOOD LEFT HAND  Final   Special Requests   Final    BOTTLES DRAWN AEROBIC AND ANAEROBIC Blood Culture adequate volume   Culture   Final    NO GROWTH 4 DAYS Performed at [facility], [address], [place], [place] [address]    Report Status PENDING  Incomplete  MRSA PCR Screening     Status: None   Collection Time: 06/16/20  6:52 PM   Specimen: Nasal Mucosa; Nasopharyngeal  Result Value Ref Range Status   MRSA by PCR NEGATIVE NEGATIVE Final    Comment:        The GeneXpert MRSA Assay (FDA approved for NASAL specimens only), is one component of a comprehensive MRSA colonization surveillance program. It is not intended to diagnose MRSA infection nor to guide or monitor treatment for MRSA infections. Performed at [facility], [address] [place], [place] [address]     Coagulation Studies: No results for input(s): LABPROT, INR  in the last 72 hours.  Urinalysis: No results for input(s): COLORURINE, LABSPEC, PHURINE, GLUCOSEU, HGBUR, BILIRUBINUR, KETONESUR, PROTEINUR, UROBILINOGEN, NITRITE, LEUKOCYTESUR in the last 72 hours.  Invalid input(s): APPERANCEUR    Imaging: DG Neck Soft Tissue  Result Date: 06/17/2020 CLINICAL DATA:  Acute respiratory failure, hypoxia EXAM: NECK SOFT TISSUES - 1+ VIEW COMPARISON:  None. FINDINGS: Endotracheal tube tip is seen within the subglottic airway. Esophageal Doppler probe extends into the upper esophagus. Under distension of the a hypopharynx and overlying tubing limits evaluation. The prevertebral soft tissues are not thickened. Extensive pulmonary infiltrates are seen within the visualized lung apices. IMPRESSION: Endotracheal tube tip seen within the subglottic airway. Electronically Signed   By: Fidela Salisbury MD  On: 06/17/2020 04:45   DG Chest Port 1 View  Result Date: 06/17/2020 CLINICAL DATA:  Intubation EXAM: PORTABLE CHEST 1 VIEW COMPARISON:  4:17 a.m. FINDINGS: The endotracheal tube is been advanced and its tip is now seen at the level of the clavicular head 7.6 cm above the carina. Esophageal Doppler probe overlies the mid esophagus, unchanged. Pulmonary insufflation remain stable. Extensive bilateral pulmonary infiltrates are again identified. No pneumothorax or pleural effusion. Cardiac size within normal limits. IMPRESSION: Interval advancement of the endotracheal tube, now seen at the level of the clavicular heads. Stable extensive bilateral pulmonary infiltrates, likely infectious Electronically Signed   By: Fidela Salisbury MD   On: 06/17/2020 05:42   DG Chest Port 1 View  Result Date: 06/17/2020 CLINICAL DATA:  Acute respiratory failure, hypoxia EXAM: PORTABLE CHEST 1 VIEW COMPARISON:  06/16/2020 FINDINGS: The endotracheal tube has withdrawn and its tip is now seen within the subglottic airway roughly 12 cm above the carina. Esophageal Doppler probe overlies the expected  mid esophagus. Pulmonary insufflation remains stable. Superimposed multifocal pulmonary infiltrates, more focal within the left mid lung zone appears he slightly improved within the right upper lobe and progressive within the mid lung zones bilaterally. No pneumothorax or pleural effusion. Cardiac size within normal limits. IMPRESSION: Endotracheal tube has been withdrawn into the subglottic airway, roughly 12 cm above the carina. Advancement is recommended for optimal positioning. Waxing and waning pulmonary infiltrates, likely infectious. These results will be called to the ordering clinician or representative by the Radiologist Assistant, and communication documented in the PACS or [employer]. Electronically Signed   By: Fidela Salisbury MD   On: 06/17/2020 04:42   DG Chest Port 1 View  Result Date: 06/16/2020 CLINICAL DATA:  Endotracheal tube placement EXAM: PORTABLE CHEST 1 VIEW COMPARISON:  06/10/2020 FINDINGS: Endotracheal tube is approximately 7 cm above the carina. Left greater than right opacities are present. No significant pleural effusion. No pneumothorax. Stable cardiomediastinal contours. IMPRESSION: Endotracheal tube 7 cm above the carina. Bilateral pulmonary opacities likely reflecting multifocal pneumonia. Electronically Signed   By: Macy Mis M.D.   On: 06/16/2020 19:47   [place] EKG SITE RITE  Result Date: 06/17/2020 If Site Rite image not attached, placement could not be confirmed due to current cardiac rhythm.     Assessment & Plan: Dylan Sullivan is a 75 y.o. white male with hypertension, sleep apnea, seizure disorder, cerebral palsy, right hemiplegia, coronary artery disease and BPH, who was admitted to [facility] on 05/31/2020 for Acute cystitis without hematuria [F79.03] Complicated UTI (urinary tract infection) [N39.0] Sepsis ([place]) [A41.9] Sepsis without acute organ dysfunction, due to unspecified organism ([place]) [A41.9]  1. Acute kidney injury: baseline creatinine of  0.9.  Secondary to IV contrast nephropathy, hypotension and sepsis.  Nonoliguric urine output.  - Check renal ultrasound - No acute indication for dialysis.  - IV fluids started.   2. Acute respiratory failure requiring intubation and mechanical ventilation. Secondary to acute COVID-19 pneumonia.  Appreciate critical care input  3. Urinary tract infection: staph epidermidus.  - cefazolin  4. Hypotension/sepsis: requiring vasopressors: norepinephrine gtt.   LOS: 5 Nariah Morgano 1/6/20224:58 PM

## 2020-06-19 ENCOUNTER — Inpatient Hospital Stay: Payer: Medicare Other

## 2020-06-19 DIAGNOSIS — Z7189 Other specified counseling: Secondary | ICD-10-CM | POA: Diagnosis not present

## 2020-06-19 DIAGNOSIS — J80 Acute respiratory distress syndrome: Secondary | ICD-10-CM | POA: Diagnosis not present

## 2020-06-19 DIAGNOSIS — Z515 Encounter for palliative care: Secondary | ICD-10-CM | POA: Diagnosis not present

## 2020-06-19 DIAGNOSIS — J9601 Acute respiratory failure with hypoxia: Secondary | ICD-10-CM | POA: Diagnosis not present

## 2020-06-19 DIAGNOSIS — A419 Sepsis, unspecified organism: Secondary | ICD-10-CM | POA: Diagnosis not present

## 2020-06-19 DIAGNOSIS — U071 COVID-19: Secondary | ICD-10-CM | POA: Diagnosis not present

## 2020-06-19 DIAGNOSIS — N390 Urinary tract infection, site not specified: Secondary | ICD-10-CM | POA: Diagnosis not present

## 2020-06-19 LAB — CBC WITH DIFFERENTIAL/PLATELET
Abs Immature Granulocytes: 0.3 10*3/uL — ABNORMAL HIGH (ref 0.00–0.07)
Basophils Absolute: 0 10*3/uL (ref 0.0–0.1)
Basophils Relative: 0 %
Eosinophils Absolute: 0.1 10*3/uL (ref 0.0–0.5)
Eosinophils Relative: 0 %
HCT: 33 % — ABNORMAL LOW (ref 39.0–52.0)
Hemoglobin: 11.2 g/dL — ABNORMAL LOW (ref 13.0–17.0)
Immature Granulocytes: 2 %
Lymphocytes Relative: 5 %
Lymphs Abs: 1.1 10*3/uL (ref 0.7–4.0)
MCH: 32.7 pg (ref 26.0–34.0)
MCHC: 33.9 g/dL (ref 30.0–36.0)
MCV: 96.5 fL (ref 80.0–100.0)
Monocytes Absolute: 0.3 10*3/uL (ref 0.1–1.0)
Monocytes Relative: 2 %
Neutro Abs: 18.6 10*3/uL — ABNORMAL HIGH (ref 1.7–7.7)
Neutrophils Relative %: 91 %
Platelets: 446 10*3/uL — ABNORMAL HIGH (ref 150–400)
RBC: 3.42 MIL/uL — ABNORMAL LOW (ref 4.22–5.81)
RDW: 13 % (ref 11.5–15.5)
WBC: 20.4 10*3/uL — ABNORMAL HIGH (ref 4.0–10.5)
nRBC: 0 % (ref 0.0–0.2)

## 2020-06-19 LAB — RENAL FUNCTION PANEL
Albumin: 1.7 g/dL — ABNORMAL LOW (ref 3.5–5.0)
Anion gap: 17 — ABNORMAL HIGH (ref 5–15)
BUN: 46 mg/dL — ABNORMAL HIGH (ref 8–23)
CO2: 16 mmol/L — ABNORMAL LOW (ref 22–32)
Calcium: 7.8 mg/dL — ABNORMAL LOW (ref 8.9–10.3)
Chloride: 100 mmol/L (ref 98–111)
Creatinine, Ser: 4.3 mg/dL — ABNORMAL HIGH (ref 0.61–1.24)
GFR, Estimated: 14 mL/min — ABNORMAL LOW (ref >60)
Glucose, Bld: 122 mg/dL — ABNORMAL HIGH (ref 70–99)
Phosphorus: 6.9 mg/dL — ABNORMAL HIGH (ref 2.5–4.6)
Potassium: 4.5 mmol/L (ref 3.5–5.1)
Sodium: 133 mmol/L — ABNORMAL LOW (ref 135–145)

## 2020-06-19 LAB — FERRITIN: Ferritin: 1958 ng/mL — ABNORMAL HIGH (ref 24–336)

## 2020-06-19 LAB — GLUCOSE, CAPILLARY
Glucose-Capillary: 117 mg/dL — ABNORMAL HIGH (ref 70–99)
Glucose-Capillary: 135 mg/dL — ABNORMAL HIGH (ref 70–99)
Glucose-Capillary: 132 mg/dL — ABNORMAL HIGH (ref 70–99)
Glucose-Capillary: 155 mg/dL — ABNORMAL HIGH (ref 70–99)
Glucose-Capillary: 150 mg/dL — ABNORMAL HIGH (ref 70–99)
Glucose-Capillary: 129 mg/dL — ABNORMAL HIGH (ref 70–99)

## 2020-06-19 LAB — CULTURE, BLOOD (ROUTINE X 2)
Culture: NO GROWTH
Culture: NO GROWTH
Special Requests: ADEQUATE
Special Requests: ADEQUATE

## 2020-06-19 LAB — C-REACTIVE PROTEIN: CRP: 22.7 mg/dL — ABNORMAL HIGH (ref <1.0)

## 2020-06-19 LAB — TRIGLYCERIDES: Triglycerides: 206 mg/dL — ABNORMAL HIGH (ref <150)

## 2020-06-19 LAB — D-DIMER, QUANTITATIVE: D-Dimer, Quant: 3.07 ug/mL-FEU — ABNORMAL HIGH (ref 0.00–0.50)

## 2020-06-19 MED ORDER — POLYETHYLENE GLYCOL 3350 17 G PO PACK: 17.0000 g | PACK | Freq: Every day | ORAL | Status: DC | PRN

## 2020-06-19 MED ORDER — IBUPROFEN 100 MG/5ML PO SUSP
200.0000 mg | Freq: Four times a day (QID) | ORAL | Status: DC | PRN
  Filled 2020-06-19: qty 10

## 2020-06-19 MED ORDER — ASPIRIN 81 MG PO CHEW
81.0000 mg | CHEWABLE_TABLET | Freq: Every day | ORAL | Status: DC
  Administered 2020-06-19: 81 mg
  Filled 2020-06-19: qty 1

## 2020-06-19 MED ORDER — GLYCOPYRROLATE 0.2 MG/ML IJ SOLN: 0.4000 mg | INTRAMUSCULAR | Status: DC | PRN

## 2020-06-19 NOTE — Progress Notes (Signed)
[facility]  ROUNDING NOTE   Subjective:   Girlfriend and family are meeting with palliative care today.  UOP 60 Norepinephrine gtt 100mg    Objective:  Vital signs in last 24 hours:  Temp:  [95.36 F (35.2 C)-98.42 F (36.9 C)] 96.62 F (35.9 C) (01/07 1100) Pulse Rate:  [60-97] 82 (01/07 1100) Resp:  [8-24] 11 (01/07 1100) BP: (107-140)/(44-78) 110/49 (01/07 1100) SpO2:  [85 %-98 %] 98 % (01/07 1100) FiO2 (%):  [60 %-100 %] 100 % (01/07 0900)  Weight change:  Filed Weights   06/07/2020 2208 06/16/20 0354  Weight: 81.6 kg 80 kg    Intake/Output: I/O last 3 completed shifts: In: 4477.4 [I.V.:3255.5; NG/GT:305.9; IV PIDPOEUMPN:361]Out: 250 [Urine:250]   Intake/Output this shift:  Total I/O In: 667.6 [I.V.:430.5; IV Piggyback:237.1] Out: 0   Physical Exam: General: Critically ill  Head: ETT, OGT  Eyes: Anicteric, PERRL  Neck:  trachea midline  Lungs:  Diminished bilaterally, PRVC FiO2 100%  Heart: Regular rate and rhythm  Abdomen:  Soft  Extremities:  + peripheral edema.  Neurologic: Intubated, on sedation  Skin: No lesions  Access: none    Basic Metabolic Panel: Recent Labs  Lab 06/13/20 0636 06/14/20 0432 06/15/20 0400 06/16/20 0515 06/18/20 0258 06/19/20 0530  NA 133* 133* 133* 132* 134* 133*  K 3.2* 3.5 3.7 3.4* 4.3 4.5  CL 101 100 101 100 99 100  CO2 23 20* 22 20* 22 16*  GLUCOSE 98 103* 112* 107* 122* 122*  BUN _0 34* 46*  CREATININE 0.96 0.89 0.85 0.90 3.09* 4.30*  CALCIUM 8.4* 8.1* 7.9* 8.3* 8.2* 7.8*  MG 1.7  --   --   --   --   --   PHOS  --   --   --   --   --  6.9*    Liver Function Tests: Recent Labs  Lab 06/13/20 0636 06/14/20 0432 06/15/20 0400 06/16/20 0515 06/18/20 0258 06/19/20 0530  AST 194* 193* 195* 181* 123*  --   ALT 62* 75* 83* 75* 10  --   ALKPHOS 40 36* 40 35* 38  --   BILITOT 0.6 0.8 0.8 0.8 0.6  --   PROT 5.8* 5.9* 5.7* 5.6* 5.5*  --   ALBUMIN 2.4* 2.3* 2.4* 2.1* 1.9* 1.7*   No  results for input(s): LIPASE, AMYLASE in the last 168 hours. No results for input(s): AMMONIA in the last 168 hours.  CBC: Recent Labs  Lab 06/08/2020 2213 06/13/20 0636 06/14/20 0432 06/15/20 0400 06/16/20 0515 06/18/20 0258 06/19/20 0530  WBC 10.0 5.8 7.5 7.2 9.4 13.1* 20.4*  NEUTROABS 8.8* 3.9  --   --   --   --  18.6*  HGB 14.2 12.6* 13.1 13.0 13.0 12.5* 11.2*  HCT 41.3 36.5* 36.0* 36.3* 36.2* 36.4* 33.0*  MCV 96.5 96.3 92.8 93.1 92.6 96.6 96.5  PLT 253 192 254 286 326 379 446*    Cardiac Enzymes: No results for input(s): CKTOTAL, CKMB, CKMBINDEX, TROPONINI in the last 168 hours.  BNP: Invalid input(s): POCBNP  CBG: Recent Labs  Lab 06/18/20 1954 06/18/20 2347 06/19/20 0334 06/19/20 0805 06/19/20 1131  GLUCAP 113* 120* 117* 135* 132*    Microbiology: Results for orders placed or performed during the hospital encounter of 06/04/2020  Culture, blood (Routine x 2)     Status: None   Collection Time: 05/18/2020 10:13 PM   Specimen: BLOOD  Result Value Ref Range Status   Specimen Description  BLOOD BLOOD RIGHT FOREARM  Final   Special Requests   Final    BOTTLES DRAWN AEROBIC AND ANAEROBIC Blood Culture adequate volume   Culture   Final    NO GROWTH 5 DAYS Performed at [facility], [address] [place], [place] [address]    Report Status 06/17/2020 FINAL  Final  Culture, blood (Routine x 2)     Status: Abnormal   Collection Time: 06/02/2020 10:30 PM   Specimen: BLOOD  Result Value Ref Range Status   Specimen Description   Final    BLOOD BLOOD LEFT ARM Performed at [facility], [address], [place], [place] [address]    Special Requests   Final    BOTTLES DRAWN AEROBIC AND ANAEROBIC Blood Culture results may not be optimal due to an inadequate volume of blood received in culture bottles Performed at [facility], [address], [place], [place] [address]    Culture  Setup Time   Final    GRAM POSITIVE COCCI AEROBIC BOTTLE  ONLY CRITICAL RESULT CALLED TO, READ BACK BY AND VERIFIED WITH: NATHAN BLEUE _0  ON 06/14/20 SKL    Culture (A)  Final    STAPHYLOCOCCUS EPIDERMIDIS THE SIGNIFICANCE OF ISOLATING THIS ORGANISM FROM A SINGLE SET OF BLOOD CULTURES WHEN MULTIPLE SETS ARE DRAWN IS UNCERTAIN. PLEASE NOTIFY THE MICROBIOLOGY DEPARTMENT WITHIN ONE WEEK IF SPECIATION AND SENSITIVITIES ARE REQUIRED. Performed at [facility], [place] [address] [place], [place] [address]    Report Status 06/15/2020 FINAL  Final  Blood Culture ID Panel (Reflexed)     Status: Abnormal   Collection Time: 06/06/2020 10:30 PM  Result Value Ref Range Status   Enterococcus faecalis NOT DETECTED NOT DETECTED Final   Enterococcus Faecium NOT DETECTED NOT DETECTED Final   Listeria monocytogenes NOT DETECTED NOT DETECTED Final   Staphylococcus species DETECTED (A) NOT DETECTED Final    Comment: CRITICAL RESULT CALLED TO, READ BACK BY AND VERIFIED WITH: NATHAN BELUE _1  ON 06/14/20 SKL    Staphylococcus aureus (BCID) NOT DETECTED NOT DETECTED Final   Staphylococcus epidermidis DETECTED (A) NOT DETECTED Final    Comment: CRITICAL RESULT CALLED TO, READ BACK BY AND VERIFIED WITH: NATHAN BELUE _2  ON 06/14/20 SKL    Staphylococcus lugdunensis NOT DETECTED NOT DETECTED Final   Streptococcus species NOT DETECTED NOT DETECTED Final   Streptococcus agalactiae NOT DETECTED NOT DETECTED Final   Streptococcus pneumoniae NOT DETECTED NOT DETECTED Final   Streptococcus pyogenes NOT DETECTED NOT DETECTED Final   A.calcoaceticus-baumannii NOT DETECTED NOT DETECTED Final   Bacteroides fragilis NOT DETECTED NOT DETECTED Final   Enterobacterales NOT DETECTED NOT DETECTED Final   Enterobacter cloacae complex NOT DETECTED NOT DETECTED Final   Escherichia coli NOT DETECTED NOT DETECTED Final   Klebsiella aerogenes NOT DETECTED NOT DETECTED Final   Klebsiella oxytoca NOT DETECTED NOT DETECTED Final   Klebsiella pneumoniae NOT DETECTED NOT DETECTED  Final   Proteus species NOT DETECTED NOT DETECTED Final   Salmonella species NOT DETECTED NOT DETECTED Final   Serratia marcescens NOT DETECTED NOT DETECTED Final   Haemophilus influenzae NOT DETECTED NOT DETECTED Final   Neisseria meningitidis NOT DETECTED NOT DETECTED Final   Pseudomonas aeruginosa NOT DETECTED NOT DETECTED Final   Stenotrophomonas maltophilia NOT DETECTED NOT DETECTED Final   Candida albicans NOT DETECTED NOT DETECTED Final   Candida auris NOT DETECTED NOT DETECTED Final   Candida glabrata NOT DETECTED NOT DETECTED Final   Candida krusei NOT DETECTED NOT  DETECTED Final   Candida parapsilosis NOT DETECTED NOT DETECTED Final   Candida tropicalis NOT DETECTED NOT DETECTED Final   Cryptococcus neoformans/gattii NOT DETECTED NOT DETECTED Final   Methicillin resistance mecA/C NOT DETECTED NOT DETECTED Final    Comment: Performed at [facility], [address] [place], [place] [address]  Urine culture     Status: Abnormal   Collection Time: 06/01/2020 10:37 PM   Specimen: Urine, Random  Result Value Ref Range Status   Specimen Description   Final    URINE, RANDOM Performed at [facility], [address] [place], [place] [address]    Special Requests   Final    NONE Performed at [facility], [address] [place], [place] [address]    Culture >=100,000 COLONIES/mL STAPHYLOCOCCUS EPIDERMIDIS (A)  Final   Report Status 06/15/2020 FINAL  Final   Organism ID, Bacteria STAPHYLOCOCCUS EPIDERMIDIS (A)  Final      Susceptibility   Staphylococcus epidermidis - MIC*    CIPROFLOXACIN >=8 RESISTANT Resistant     GENTAMICIN <=0.5 SENSITIVE Sensitive     NITROFURANTOIN 32 SENSITIVE Sensitive     OXACILLIN <=0.25 SENSITIVE Sensitive     TETRACYCLINE <=1 SENSITIVE Sensitive     VANCOMYCIN <=0.5 SENSITIVE Sensitive     TRIMETH/SULFA <=10 SENSITIVE Sensitive     CLINDAMYCIN <=0.25 SENSITIVE Sensitive     RIFAMPIN <=0.5 SENSITIVE Sensitive      Inducible Clindamycin NEGATIVE Sensitive     * >=100,000 COLONIES/mL STAPHYLOCOCCUS EPIDERMIDIS  Resp Panel by RT-PCR (Flu A&B, Covid) Nasopharyngeal Swab     Status: Abnormal   Collection Time: 06/13/20  9:18 AM   Specimen: Nasopharyngeal Swab; Nasopharyngeal(NP) swabs in vial transport medium  Result Value Ref Range Status   SARS Coronavirus 2 by RT PCR POSITIVE (A) NEGATIVE Final    Comment: RESULT CALLED TO, READ BACK BY AND VERIFIED WITH: ANGELA ROBBINS 06/13/20 AT 1103 BY ACR (NOTE) SARS-CoV-2 target nucleic acids are DETECTED.  The SARS-CoV-2 RNA is generally detectable in upper respiratory specimens during the acute phase of infection. Positive results are indicative of the presence of the identified virus, but do not rule out bacterial infection or co-infection with other pathogens not detected by the test. Clinical correlation with patient history and other diagnostic information is necessary to determine patient infection status. The expected result is Negative.  Fact Sheet for Patients: EntrepreneurPulse.com.au  Fact Sheet for Healthcare Providers: IncredibleEmployment.be  This test is not yet approved or cleared by the [place] FDA and  has been authorized for detection and/or diagnosis of SARS-CoV-2 by FDA under an Emergency Use Authorization (EUA).  This EUA will remain in effect (meaning this test can  be used) for the duration of  the COVID-19 declaration under Section 564(b)(1) of the Act, 21 U.S.C. section 360bbb-3(b)(1), unless the authorization is terminated or revoked sooner.     Influenza A by PCR NEGATIVE NEGATIVE Final   Influenza B by PCR NEGATIVE NEGATIVE Final    Comment: (NOTE) The Xpert Xpress SARS-CoV-2/FLU/RSV plus assay is intended as an aid in the diagnosis of influenza from Nasopharyngeal swab specimens and should not be used as a sole basis for treatment. Nasal washings and aspirates are unacceptable  for Xpert Xpress SARS-CoV-2/FLU/RSV testing.  Fact Sheet for Patients: EntrepreneurPulse.com.au  Fact Sheet for Healthcare Providers: IncredibleEmployment.be  This test is not yet approved or cleared by the [place] FDA and has been authorized for detection and/or diagnosis of SARS-CoV-2 by FDA under  an Emergency Use Authorization (EUA). This EUA will remain in effect (meaning this test can be used) for the duration of the COVID-19 declaration under Section 564(b)(1) of the Act, 21 U.S.C. section 360bbb-3(b)(1), unless the authorization is terminated or revoked.  Performed at [facility], [address] [place], [place] [address]   CULTURE, BLOOD (ROUTINE X 2) w Reflex to ID Panel     Status: None   Collection Time: 06/14/20  8:37 AM   Specimen: BLOOD  Result Value Ref Range Status   Specimen Description BLOOD LEFT HAND  Final   Special Requests   Final    BOTTLES DRAWN AEROBIC AND ANAEROBIC Blood Culture adequate volume   Culture   Final    NO GROWTH 5 DAYS Performed at [facility], [address] [place], [place] [address]    Report Status 06/19/2020 FINAL  Final  CULTURE, BLOOD (ROUTINE X 2) w Reflex to ID Panel     Status: None   Collection Time: 06/14/20  8:38 AM   Specimen: BLOOD  Result Value Ref Range Status   Specimen Description BLOOD LEFT HAND  Final   Special Requests   Final    BOTTLES DRAWN AEROBIC AND ANAEROBIC Blood Culture adequate volume   Culture   Final    NO GROWTH 5 DAYS Performed at [facility], [address], [place], [place] [address]    Report Status 06/19/2020 FINAL  Final  MRSA PCR Screening     Status: None   Collection Time: 06/16/20  6:52 PM   Specimen: Nasal Mucosa; Nasopharyngeal  Result Value Ref Range Status   MRSA by PCR NEGATIVE NEGATIVE Final    Comment:        The GeneXpert MRSA Assay (FDA approved for NASAL specimens only), is one component of  a comprehensive MRSA colonization surveillance program. It is not intended to diagnose MRSA infection nor to guide or monitor treatment for MRSA infections. Performed at [facility], [address] [place], [place] [address]     Coagulation Studies: No results for input(s): LABPROT, INR in the last 72 hours.  Urinalysis: No results for input(s): COLORURINE, LABSPEC, PHURINE, GLUCOSEU, HGBUR, BILIRUBINUR, KETONESUR, PROTEINUR, UROBILINOGEN, NITRITE, LEUKOCYTESUR in the last 72 hours.  Invalid input(s): APPERANCEUR    Imaging: DG Chest 1 View  Result Date: 06/18/2020 CLINICAL DATA:  Central line placement.  Respiratory failure. EXAM: CHEST  1 VIEW COMPARISON:  06/17/2020 and prior radiographs FINDINGS: A RIGHT IJ central venous catheter is noted with tip overlying the LOWER SVC. An endotracheal tube with tip 7.7 cm above the carina, NG tube with tip overlying the proximal stomach and esophageal probe again noted. Diffuse bilateral airspace opacities are not significantly changed. There is no evidence of pneumothorax. IMPRESSION: 1. RIGHT IJ central venous catheter with tip overlying the LOWER SVC. No evidence of pneumothorax. 2. Unchanged diffuse bilateral airspace opacities. Electronically Signed   By: Margarette Canada M.D.   On: 06/18/2020 19:06   DG Abd 1 View  Result Date: 06/19/2020 CLINICAL DATA:  Nasal/orogastric tube placement. EXAM: ABDOMEN - 1 VIEW COMPARISON:  06/18/2020. FINDINGS: Nasal/orogastric tube tip projects into the mid stomach, side hole well within the stomach, further advanced when compared to the prior day's study. Normal bowel gas pattern. No change in the lung base interstitial and airspace opacities. IMPRESSION: 1. Well-positioned nasal/orogastric tube. Electronically Signed   By: Lajean Manes M.D.   On: 06/19/2020 11:25   DG Abd 1 View  Result Date: 06/18/2020 CLINICAL DATA:  NG tube placement. EXAM: ABDOMEN - 1 VIEW COMPARISON:  None. FINDINGS: An NG  tube is noted with tip overlying the proximal stomach and side hole at the esophagogastric junction. IMPRESSION: NG tube with tip overlying the proximal stomach and side hole at the esophagogastric junction. Electronically Signed   By: Margarette Canada M.D.   On: 06/18/2020 19:07   US RENAL  Result Date: 06/19/2020 CLINICAL DATA:  Acute renal failure. EXAM: RENAL / URINARY TRACT ULTRASOUND COMPLETE COMPARISON:  None. FINDINGS: Right Kidney: Renal measurements: 11.4 x 6.9 x 7.2 cm = volume: 296 mL. Mild increased parenchymal echogenicity. Mild lobulation of the renal contour. No mass, stone or hydronephrosis. Left Kidney: Renal measurements: 13.3 x 6.6 x 6.8 cm = volume: 312.6 mL. Mild increased parenchymal echogenicity. Moderate hydronephrosis. No mass or stone. Bladder: Decompressed with a Foley catheter. Other: Trace amount of perinephric edema/fluid bilaterally. IMPRESSION: 1. Moderate left hydronephrosis. The etiology of this is unclear. Consider follow-up CT without contrast to assess for a possible distal ureteral stone or other obstruction. 2. Mild bilateral increased renal parenchymal echogenicity consistent with medical renal disease. Normal renal sizes. No renal masses. 3. Bladder not well evaluated, decompressed with a Foley catheter. Electronically Signed   By: Lajean Manes M.D.   On: 06/19/2020 10:33   DG Chest Port 1 View  Result Date: 06/19/2020 CLINICAL DATA:  Acute respiratory failure EXAM: PORTABLE CHEST 1 VIEW COMPARISON:  Yesterday FINDINGS: Endotracheal tube with tip at the clavicular heads. The enteric tube tip and side-port reaches the stomach. Right IJ line with tip at the SVC. Hazy bilateral chest opacification with greater density on the left. Normal heart size and mediastinal contours. No visible effusion or pneumothorax. IMPRESSION: 1. Stable hardware positioning. 2. Unchanged extensive airspace disease. Electronically Signed   By: Monte Fantasia M.D.   On: 06/19/2020 04:20      Medications:   . sodium chloride 10 mL/hr at 06/17/20 0600  . sodium chloride 10 mL/hr at 06/17/20 0600  . sodium chloride 75 mL/hr at 06/19/20 1100  .  ceFAZolin (ANCEF) IV Stopped (06/19/20 5456)  . famotidine (PEPCID) IV Stopped (06/19/20 2563)  . feeding supplement (VITAL HIGH PROTEIN) 35 mL/hr at 06/19/20 0309  . fentaNYL infusion INTRAVENOUS 175 mcg/hr (06/19/20 1100)  . levETIRAcetam 750 mg (06/19/20 0516)  . norepinephrine (LEVOPHED) Adult infusion 2 mcg/min (06/19/20 1100)  . propofol (DIPRIVAN) infusion 55 mcg/kg/min (06/19/20 1100)  . remdesivir 100 mg in NS 100 mL Stopped (06/19/20 0954)   . aspirin  81 mg Per Tube Daily  . chlorhexidine gluconate (MEDLINE KIT)  15 mL Mouth Rinse BID  . Chlorhexidine Gluconate Cloth  6 each Topical Daily  . dexamethasone (DECADRON) injection  6 mg Intravenous Q24H  . docusate  100 mg Per Tube BID  . enoxaparin (LOVENOX) injection  30 mg Subcutaneous Q24H  . insulin aspart  0-9 Units Subcutaneous Q4H  . mouth rinse  15 mL Mouth Rinse 10 times per day  . multivitamin  15 mL Per Tube Daily  . phenytoin (DILANTIN) IV  100 mg Intravenous Q8H  . polyethylene glycol  17 g Per Tube Daily   sodium chloride, acetaminophen **OR** acetaminophen, fentaNYL, ibuprofen, metoprolol tartrate, midazolam, midazolam, morphine injection, ondansetron **OR** ondansetron (ZOFRAN) IV, polyethylene glycol, vecuronium  Assessment/ Plan:  Mr. Dylan Sullivan is a 75 y.o. white male with hypertension, sleep apnea, seizure disorder, cerebral palsy, right hemiplegia, coronary artery disease and BPH, who was admitted to  [place] on 05/22/2020 for Acute cystitis without hematuria [H96.22] Complicated UTI (urinary tract infection) [N39.0] Sepsis ([place]) [A41.9] Sepsis without acute organ dysfunction, due to unspecified organism ([place]) [A41.9]  1. Acute kidney injury: baseline creatinine of 0.9.  Secondary to IV contrast nephropathy, hypotension and sepsis.  Nonoliguric  urine output.  Left hydronephrosis - Low threshold for renal replacement therapy. Anuric. Requiring vasopressors. Family will decide today, meeting with palliative care.   - IV fluids    2. Acute respiratory failure requiring intubation and mechanical ventilation. Secondary to acute COVID-19 pneumonia.  Appreciate critical care input  3. Urinary tract infection: staph epidermidus.  - cefazolin  4. Hypotension/sepsis: requiring vasopressors: norepinephrine gtt.    LOS: 6 Dylan Sullivan 1/7/202212:10 PM

## 2020-06-19 NOTE — Progress Notes (Signed)
CRITICAL CARE NOTE   1/4 intubated for severe COVID 19 infection and pneumonia 1/5 intubated, severe resp failure patient made DNR 1/6 HCPOA does NOT want comfort measures as previously discussed  CC  follow up respiratory failure  SUBJECTIVE Patient remains critically ill Prognosis is guarded Progressive multiorgan failure  Vent Mode: PRVC FiO2 (%):  [60 %-100 %] 100 % Set Rate:  [24 bmp] 24 bmp Vt Set:  [450 mL] 450 mL PEEP:  [10 cmH20] 10 cmH20 Plateau Pressure:  [26 cmH20] 26 cmH20  CBC    Component Value Date/Time   WBC 20.4 (H) 06/19/2020 0530   RBC 3.42 (L) 06/19/2020 0530   HGB 11.2 (L) 06/19/2020 0530   HGB 15.2 09/09/2012 1330   HCT 33.0 (L) 06/19/2020 0530   HCT 42.6 09/09/2012 1330   PLT 446 (H) 06/19/2020 0530   PLT 370 09/09/2012 1330   MCV 96.5 06/19/2020 0530   MCV 98 09/09/2012 1330   MCH 32.7 06/19/2020 0530   MCHC 33.9 06/19/2020 0530   RDW 13.0 06/19/2020 0530   RDW 12.3 09/09/2012 1330   LYMPHSABS 1.1 06/19/2020 0530   MONOABS 0.3 06/19/2020 0530   EOSABS 0.1 06/19/2020 0530   BASOSABS 0.0 06/19/2020 0530   BMP Latest Ref Rng & Units 06/18/2020 06/16/2020 06/15/2020  Glucose 70 - 99 mg/dL 122(H) 107(H) 112(H)  BUN 8 - 23 mg/dL 34(H) 18 16  Creatinine 0.61 - 1.24 mg/dL 3.09(H) 0.90 0.85  Sodium 135 - 145 mmol/L 134(L) 132(L) 133(L)  Potassium 3.5 - 5.1 mmol/L 4.3 3.4(L) 3.7  Chloride 98 - 111 mmol/L 99 100 101  CO2 22 - 32 mmol/L 22 20(L) 22  Calcium 8.9 - 10.3 mg/dL 8.2(L) 8.3(L) 7.9(L)     BP 132/78   Pulse 92   Temp 98.06 F (36.7 C)   Resp (!) 24   Ht 5\' 8"  (1.727 m)   Wt 80 kg   SpO2 94%   BMI 26.82 kg/m    I/O last 3 completed shifts: In: 2379.4 [I.V.:1779.3; IV Piggyback:600] Out: 250 [Urine:250] No intake/output data recorded.  SpO2: 94 % O2 Flow Rate (L/min): 20 L/min FiO2 (%): 100 %  Estimated body mass index is 26.82 kg/m as calculated from the following:   Height as of this encounter: 5\' 8"  (1.727 m).   Weight  as of this encounter: 80 kg.  SIGNIFICANT EVENTS   REVIEW OF SYSTEMS  PATIENT IS UNABLE TO PROVIDE COMPLETE REVIEW OF SYSTEMS DUE TO SEVERE CRITICAL ILLNESS      COVID-19 DISASTER DECLARATION:   FULL CONTACT PHYSICAL EXAMINATION WAS NOT POSSIBLE DUE TO TREATMENT OF COVID-19  AND CONSERVATION OF PERSONAL PROTECTIVE EQUIPMENT, LIMITED EXAM FINDINGS INCLUDE-   PHYSICAL EXAMINATION:  GENERAL:critically ill appearing, +resp distress NEUROLOGIC: obtunded, GCS<8   Patient assessed or the symptoms described in the history of present illness.  In the context of the Global COVID-19 pandemic, which necessitated consideration that the patient might be at risk for infection with the SARS-CoV-2 virus that causes COVID-19, Institutional protocols and algorithms that pertain to the evaluation of patients at risk for COVID-19 are in a state of rapid change based on information released by regulatory bodies including the CDC and federal and state organizations. These policies and algorithms were followed during the patient's care while in hospital.    MEDICATIONS: I have reviewed all medications and confirmed regimen as documented   CULTURE RESULTS   Recent Results (from the past 240 hour(s))  Culture, blood (Routine x 2)  Status: None   Collection Time: 22-Jun-2020 10:13 PM   Specimen: BLOOD  Result Value Ref Range Status   Specimen Description BLOOD BLOOD RIGHT FOREARM  Final   Special Requests   Final    BOTTLES DRAWN AEROBIC AND ANAEROBIC Blood Culture adequate volume   Culture   Final    NO GROWTH 5 DAYS Performed at [facility], [address], [place], [place] [address]    Report Status 06/17/2020 FINAL  Final  Culture, blood (Routine x 2)     Status: Abnormal   Collection Time: 06/22/20 10:30 PM   Specimen: BLOOD  Result Value Ref Range Status   Specimen Description   Final    BLOOD BLOOD LEFT ARM Performed at [facility], [address]  [place], [place] [address]    Special Requests   Final    BOTTLES DRAWN AEROBIC AND ANAEROBIC Blood Culture results may not be optimal due to an inadequate volume of blood received in culture bottles Performed at [facility], [address], [place], [place] [address]    Culture  Setup Time   Final    GRAM POSITIVE COCCI AEROBIC BOTTLE ONLY CRITICAL RESULT CALLED TO, READ BACK BY AND VERIFIED WITH: NATHAN BLEUE @0237  ON 06/14/20 SKL    Culture (A)  Final    STAPHYLOCOCCUS EPIDERMIDIS THE SIGNIFICANCE OF ISOLATING THIS ORGANISM FROM A SINGLE SET OF BLOOD CULTURES WHEN MULTIPLE SETS ARE DRAWN IS UNCERTAIN. PLEASE NOTIFY THE MICROBIOLOGY DEPARTMENT WITHIN ONE WEEK IF SPECIATION AND SENSITIVITIES ARE REQUIRED. Performed at [facility], [place] [address], [place],  [address]    Report Status 06/15/2020 FINAL  Final  Blood Culture ID Panel (Reflexed)     Status: Abnormal   Collection Time: 06/22/20 10:30 PM  Result Value Ref Range Status   Enterococcus faecalis NOT DETECTED NOT DETECTED Final   Enterococcus Faecium NOT DETECTED NOT DETECTED Final   Listeria monocytogenes NOT DETECTED NOT DETECTED Final   Staphylococcus species DETECTED (A) NOT DETECTED Final    Comment: CRITICAL RESULT CALLED TO, READ BACK BY AND VERIFIED WITH: NATHAN BELUE @0237  ON 06/14/20 SKL    Staphylococcus aureus (BCID) NOT DETECTED NOT DETECTED Final   Staphylococcus epidermidis DETECTED (A) NOT DETECTED Final    Comment: CRITICAL RESULT CALLED TO, READ BACK BY AND VERIFIED WITH: NATHAN BELUE @0237  ON 06/14/20 SKL    Staphylococcus lugdunensis NOT DETECTED NOT DETECTED Final   Streptococcus species NOT DETECTED NOT DETECTED Final   Streptococcus agalactiae NOT DETECTED NOT DETECTED Final   Streptococcus pneumoniae NOT DETECTED NOT DETECTED Final   Streptococcus pyogenes NOT DETECTED NOT DETECTED Final   A.calcoaceticus-baumannii NOT DETECTED NOT DETECTED Final   Bacteroides fragilis NOT  DETECTED NOT DETECTED Final   Enterobacterales NOT DETECTED NOT DETECTED Final   Enterobacter cloacae complex NOT DETECTED NOT DETECTED Final   Escherichia coli NOT DETECTED NOT DETECTED Final   Klebsiella aerogenes NOT DETECTED NOT DETECTED Final   Klebsiella oxytoca NOT DETECTED NOT DETECTED Final   Klebsiella pneumoniae NOT DETECTED NOT DETECTED Final   Proteus species NOT DETECTED NOT DETECTED Final   Salmonella species NOT DETECTED NOT DETECTED Final   Serratia marcescens NOT DETECTED NOT DETECTED Final   Haemophilus influenzae NOT DETECTED NOT DETECTED Final   Neisseria meningitidis NOT DETECTED NOT DETECTED Final   Pseudomonas aeruginosa NOT DETECTED NOT DETECTED Final   Stenotrophomonas maltophilia NOT DETECTED NOT DETECTED Final   Candida albicans NOT DETECTED NOT DETECTED Final  Candida auris NOT DETECTED NOT DETECTED Final   Candida glabrata NOT DETECTED NOT DETECTED Final   Candida krusei NOT DETECTED NOT DETECTED Final   Candida parapsilosis NOT DETECTED NOT DETECTED Final   Candida tropicalis NOT DETECTED NOT DETECTED Final   Cryptococcus neoformans/gattii NOT DETECTED NOT DETECTED Final   Methicillin resistance mecA/C NOT DETECTED NOT DETECTED Final    Comment: Performed at [facility], [address] [place], [place] [address]  Urine culture     Status: Abnormal   Collection Time: 05/26/2020 10:37 PM   Specimen: Urine, Random  Result Value Ref Range Status   Specimen Description   Final    URINE, RANDOM Performed at [facility], [address] [place], [place] [address]    Special Requests   Final    NONE Performed at [facility], [address], [place], [place] [address]    Culture >=100,000 COLONIES/mL STAPHYLOCOCCUS EPIDERMIDIS (A)  Final   Report Status 06/15/2020 FINAL  Final   Organism ID, Bacteria STAPHYLOCOCCUS EPIDERMIDIS (A)  Final      Susceptibility   Staphylococcus epidermidis - MIC*    CIPROFLOXACIN >=8 RESISTANT  Resistant     GENTAMICIN <=0.5 SENSITIVE Sensitive     NITROFURANTOIN 32 SENSITIVE Sensitive     OXACILLIN <=0.25 SENSITIVE Sensitive     TETRACYCLINE <=1 SENSITIVE Sensitive     VANCOMYCIN <=0.5 SENSITIVE Sensitive     TRIMETH/SULFA <=10 SENSITIVE Sensitive     CLINDAMYCIN <=0.25 SENSITIVE Sensitive     RIFAMPIN <=0.5 SENSITIVE Sensitive     Inducible Clindamycin NEGATIVE Sensitive     * >=100,000 COLONIES/mL STAPHYLOCOCCUS EPIDERMIDIS  Resp Panel by RT-PCR (Flu A&B, Covid) Nasopharyngeal Swab     Status: Abnormal   Collection Time: 06/13/20  9:18 AM   Specimen: Nasopharyngeal Swab; Nasopharyngeal(NP) swabs in vial transport medium  Result Value Ref Range Status   SARS Coronavirus 2 by RT PCR POSITIVE (A) NEGATIVE Final    Comment: RESULT CALLED TO, READ BACK BY AND VERIFIED WITH: ANGELA ROBBINS 06/13/20 AT 1103 BY ACR (NOTE) SARS-CoV-2 target nucleic acids are DETECTED.  The SARS-CoV-2 RNA is generally detectable in upper respiratory specimens during the acute phase of infection. Positive results are indicative of the presence of the identified virus, but do not rule out bacterial infection or co-infection with other pathogens not detected by the test. Clinical correlation with patient history and other diagnostic information is necessary to determine patient infection status. The expected result is Negative.  Fact Sheet for Patients: EntrepreneurPulse.com.au  Fact Sheet for Healthcare Providers: IncredibleEmployment.be  This test is not yet approved or cleared by the [place] FDA and  has been authorized for detection and/or diagnosis of SARS-CoV-2 by FDA under an Emergency Use Authorization (EUA).  This EUA will remain in effect (meaning this test can  be used) for the duration of  the COVID-19 declaration under Section 564(b)(1) of the Act, 21 U.S.C. section 360bbb-3(b)(1), unless the authorization is terminated or revoked  sooner.     Influenza A by PCR NEGATIVE NEGATIVE Final   Influenza B by PCR NEGATIVE NEGATIVE Final    Comment: (NOTE) The Xpert Xpress SARS-CoV-2/FLU/RSV plus assay is intended as an aid in the diagnosis of influenza from Nasopharyngeal swab specimens and should not be used as a sole basis for treatment. Nasal washings and aspirates are unacceptable for Xpert Xpress SARS-CoV-2/FLU/RSV testing.  Fact Sheet for Patients: EntrepreneurPulse.com.au  Fact Sheet for Healthcare Providers: IncredibleEmployment.be  This test is  not yet approved or cleared by the [place] and has been authorized for detection and/or diagnosis of SARS-CoV-2 by FDA under an Emergency Use Authorization (EUA). This EUA will remain in effect (meaning this test can be used) for the duration of the COVID-19 declaration under Section 564(b)(1) of the Act, 21 U.S.C. section 360bbb-3(b)(1), unless the authorization is terminated or revoked.  Performed at [facility], [address] [place], [place] [address]   CULTURE, BLOOD (ROUTINE X 2) w Reflex to ID Panel     Status: None (Preliminary result)   Collection Time: 06/14/20  8:37 AM   Specimen: BLOOD  Result Value Ref Range Status   Specimen Description BLOOD LEFT HAND  Final   Special Requests   Final    BOTTLES DRAWN AEROBIC AND ANAEROBIC Blood Culture adequate volume   Culture   Final    NO GROWTH 4 DAYS Performed at [facility], [address], [place], [place] [address]    Report Status PENDING  Incomplete  CULTURE, BLOOD (ROUTINE X 2) w Reflex to ID Panel     Status: None (Preliminary result)   Collection Time: 06/14/20  8:38 AM   Specimen: BLOOD  Result Value Ref Range Status   Specimen Description BLOOD LEFT HAND  Final   Special Requests   Final    BOTTLES DRAWN AEROBIC AND ANAEROBIC Blood Culture adequate volume   Culture   Final    NO GROWTH 4 DAYS Performed at [facility], [address], [place], [place] [address]    Report Status PENDING  Incomplete  MRSA PCR Screening     Status: None   Collection Time: 06/16/20  6:52 PM   Specimen: Nasal Mucosa; Nasopharyngeal  Result Value Ref Range Status   MRSA by PCR NEGATIVE NEGATIVE Final    Comment:        The GeneXpert MRSA Assay (FDA approved for NASAL specimens only), is one component of a comprehensive MRSA colonization surveillance program. It is not intended to diagnose MRSA infection nor to guide or monitor treatment for MRSA infections. Performed at [facility], [address] [place], [place] [address]           IMAGING    DG Chest 1 View  Result Date: 06/18/2020 CLINICAL DATA:  Central line placement.  Respiratory failure. EXAM: CHEST  1 VIEW COMPARISON:  06/17/2020 and prior radiographs FINDINGS: A RIGHT IJ central venous catheter is noted with tip overlying the LOWER SVC. An endotracheal tube with tip 7.7 cm above the carina, NG tube with tip overlying the proximal stomach and esophageal probe again noted. Diffuse bilateral airspace opacities are not significantly changed. There is no evidence of pneumothorax. IMPRESSION: 1. RIGHT IJ central venous catheter with tip overlying the LOWER SVC. No evidence of pneumothorax. 2. Unchanged diffuse bilateral airspace opacities. Electronically Signed   By: Margarette Canada M.D.   On: 06/18/2020 19:06   DG Abd 1 View  Result Date: 06/18/2020 CLINICAL DATA:  NG tube placement. EXAM: ABDOMEN - 1 VIEW COMPARISON:  None. FINDINGS: An NG tube is noted with tip overlying the proximal stomach and side hole at the esophagogastric junction. IMPRESSION: NG tube with tip overlying the proximal stomach and side hole at the esophagogastric junction. Electronically Signed   By: Margarette Canada M.D.   On: 06/18/2020 19:07   DG Chest Port 1 View  Result Date: 06/19/2020 CLINICAL DATA:  Acute respiratory failure EXAM: PORTABLE CHEST 1 VIEW COMPARISON:  Yesterday FINDINGS: Endotracheal tube with tip at the clavicular heads. The enteric tube tip and side-port reaches the stomach. Right IJ line with tip at the SVC. Hazy bilateral chest opacification with greater density on the left. Normal heart size and mediastinal contours. No visible effusion or pneumothorax. IMPRESSION: 1. Stable hardware positioning. 2. Unchanged extensive airspace disease. Electronically Signed   By: Monte Fantasia M.D.   On: 06/19/2020 04:20     Nutrition Status: Nutrition Problem: Inadequate oral intake Etiology: inability to eat (pt sedated and ventilated) Signs/Symptoms: NPO status       Indwelling Urinary Catheter continued, requirement due to   Reason to continue Indwelling Urinary Catheter strict Intake/Output monitoring for hemodynamic instability   Central Line/ continued, requirement due to  Reason to continue [place] of central venous pressure or other hemodynamic parameters and poor IV access   Ventilator continued, requirement due to severe respiratory failure   Ventilator Sedation RASS 0 to -2      ASSESSMENT AND PLAN SYNOPSIS  Acute hypoxemic respiratory failure due to COVID-19 pneumonia / ARDS Mechanical ventilation via ARDS protocol, target PRVC 6 cc/kg Wean PEEP and FiO2 as able Goal plateau pressure less than 30, driving pressure less than 15 Paralytics if necessary for vent synchrony, gas exchange Cycle prone positioning if necessary for oxygenation Deep sedation per PAD protocol, goal RASS -4,  Diuresis as blood pressure and renal function can tolerate, goal CVP 5-8.   diuresis as tolerated based on Kidney function VAP prevention order set Remdesivir  IV STEROIDS     Severe ACUTE Hypoxic and Hypercapnic Respiratory Failure -continue Full MV support -continue Bronchodilator Therapy -Wean Fio2 and PEEP as tolerated -VAP/VENT bundle implementation   ACUTE KIDNEY INJURY/Renal Failure -continue Foley  Catheter-assess need -Avoid nephrotoxic agents -Follow urine output, BMP -Ensure adequate renal perfusion, optimize oxygenation -Renal dose medications     NEUROLOGY Acute toxic metabolic encephalopathy, need for sedation Goal RASS -2 to -3  CARDIAC ICU monitoring  ID -continue IV abx as prescibed -follow up cultures  GI GI PROPHYLAXIS as indicated   DIET-->TF's as tolerated Constipation protocol as indicated  ENDO - will use ICU hypoglycemic\Hyperglycemia protocol if indicated     ELECTROLYTES -follow labs as needed -replace as needed -pharmacy consultation and following   DVT/GI PRX ordered and assessed TRANSFUSIONS AS NEEDED MONITOR FSBS I Assessed the need for Labs I Assessed the need for Foley I Assessed the need for Central Venous Line Family Discussion when available I Assessed the need for Mobilization I made an Assessment of medications to be adjusted accordingly Safety Risk assessment completed   CASE DISCUSSED IN MULTIDISCIPLINARY ROUNDS WITH ICU TEAM  Critical Care Time devoted to patient care services described in this note is 55 minutes.   Overall, patient is critically ill, prognosis is guarded.  Patient with Multiorgan failure and at high risk for cardiac arrest and death.   Patient is suffering and in dying process due to [place], M.D.  [facility]  Medical Director [place] Director [facility] Cardio-Pulmonary Department

## 2020-06-19 NOTE — Consult Note (Signed)
Consultation Note Date: 06/19/2020   Patient Name: Dylan Sullivan  DOB: [birth year]  MRN: [id number]  Age / Sex: 75 y.o., male  PCP: Casilda Carls, MD Referring Physician: Flora Lipps, MD  Reason for Consultation: Establishing goals of care  HPI/Patient Profile: 75 y.o. male  with past medical history of cerebral palsy with right-sided hemiplegia, remote history of brain surgery with resultant left sided extensive encephalomalacia, seizure disorder, CAD, sleep apnea, BPH admitted on 06/10/2020 with fall, weakness, fever with UTI and COVID pneumonia and now with multiorgan failure and required intubation 1/4. 1/5 was briefly comfort care but then HCPOA reversed decision for comfort per notes. Hospitalization further complicated by acute renal failure.   Clinical Assessment and Goals of Care: I initially discussed case with RN and Dr. Mortimer Fries. I spoke with partner of 30+ years, Darene Lamer, who is also his HCPOA (copy on shadow chart). I met with Kena at bedside along with her son, Elta Guadeloupe, and her daughter, Sharyn Lull, via telephone.   We discussed Azion's severe infection and poor lung function. He is requiring 100% oxygen via ventilator (down to only 90% when we meet at bedside) but with accessory muscle use and still struggling for each breathe. Unfortunately he also has decline in renal function and not making much if any urine currently. Darene Lamer is appropriately tearful. She was hoping that he would improve and felt he was a little better yesterday but knows that he would not want to be prolonged in this state. She shares that she is trying to reach some of his friends and wants to speak with their pastor and begin plans for funeral.   At bedside we discussed plan and options. We discussed how to move forward with comfort. Ultimately we decided that we will keep him comfortable and peaceful through the night. Family coming from  out of town to be present tomorrow morning with plan for extubation to comfort. They understand he is not expected to survive long off life support. They do not want him to suffer and want to ensure he is comfortable overnight.   Recommend liberalized visitation for end of life and liberation of life support to comfort planned for in the morning. EOL COVID+ visitation for 3 visitors at bedside at a time that may switch out as needed.   All questions/concerns addressed. Emotional support provided.   Primary Decision Maker HCPOA Maye Hides    SUMMARY OF RECOMMENDATIONS   - Comfort focused care overnight.  - Plan for transition off vent to full comfort in the morning once family have gathered.   Code Status/Advance Care Planning:  DNR   Symptom Management:   Minimize medications for comfort.   D/C labs, CBG for comfort.   D/C tube feeding in anticipation of extubation and better management of secretions for comfort.   Maximize fentanyll/propofol overnight as needed to ensure comfort. PCCM to manage comfort medications for extubation.   Palliative Prophylaxis:   Aspiration, Bowel Regimen, Delirium Protocol, Frequent Pain Assessment, Oral Care and Turn Reposition  Additional Recommendations (Limitations,  Scope, Preferences):  Full Comfort Care in the morning  Psycho-social/Spiritual:   Desire for further Chaplaincy support:yes  Additional Recommendations: Grief/Bereavement Support  Prognosis:   Hours - Days  Discharge Planning: Anticipated Hospital Death      Primary Diagnoses: Present on Admission: . Essential (primary) hypertension . BPH without obstruction/lower urinary tract symptoms . Sepsis secondary to UTI ([place]) . Complicated UTI (urinary tract infection)   I have reviewed the medical record, interviewed the patient and family, and examined the patient. The following aspects are pertinent.  Past Medical History:  Diagnosis Date  . BP (high blood  pressure) 04/16/2015  . BPH (benign prostatic hyperplasia)   . BPH with obstruction/lower urinary tract symptoms 04/20/2015  . Cancer ([place])    skin  . Carotid artery stenosis 11/11/2016  . Cerebral palsy ([place])   . Elevated PSA   . Hematuria   . Hemiplegia ([place]) 09/13/2011   Overview:  Upper extremity most affected   . History of elevated PSA 04/20/2015  . Hydronephrosis   . Kidney stone   . Nocturia   . Obstructive apnea 12/29/2014  . Osteoporosis   . Partial epilepsy with impairment of consciousness ([place]) 09/13/2011   4097,3532.  none since starting keppra  . Right hemiplegia ([place]) 09/13/2011  . Seizures ([place])    1956-62. esolved after brain surgery  . Sleep apnea    CPAP  . Urinary retention    Social History   Socioeconomic History  . Marital status: Single    Spouse name: Not on file  . Number of children: Not on file  . Years of education: Not on file  . Highest education level: Not on file  Occupational History  . Not on file  Tobacco Use  . Smoking status: Never Smoker  . Smokeless tobacco: Never Used  Vaping Use  . Vaping Use: Never used  Substance and Sexual Activity  . Alcohol use: No    Alcohol/week: 0.0 standard drinks  . Drug use: No  . Sexual activity: Not on file  Other Topics Concern  . Not on file  Social History Narrative  . Not on file   Social Determinants of Health   Financial Resource Strain: Not on file  Food Insecurity: Not on file  Transportation Needs: Not on file  Physical Activity: Not on file  Stress: Not on file  Social Connections: Not on file   Family History  Problem Relation Age of Onset  . Breast cancer Maternal Aunt   . Dementia Mother   . CAD Father   . Prostate cancer Neg Hx   . Bladder Cancer Neg Hx   . Kidney cancer Neg Hx    Scheduled Meds: . aspirin EC  81 mg Oral Daily  . chlorhexidine gluconate (MEDLINE KIT)  15 mL Mouth Rinse BID  . Chlorhexidine Gluconate Cloth  6 each Topical Daily  . dexamethasone (DECADRON)  injection  6 mg Intravenous Q24H  . docusate  100 mg Per Tube BID  . enoxaparin (LOVENOX) injection  30 mg Subcutaneous Q24H  . insulin aspart  0-9 Units Subcutaneous Q4H  . mouth rinse  15 mL Mouth Rinse 10 times per day  . multivitamin  15 mL Per Tube Daily  . phenytoin (DILANTIN) IV  100 mg Intravenous Q8H  . polyethylene glycol  17 g Per Tube Daily   Continuous Infusions: . sodium chloride 10 mL/hr at 06/17/20 0600  . sodium chloride 10 mL/hr at 06/17/20 0600  . sodium chloride 75 mL/hr  at 06/18/20 2004  .  ceFAZolin (ANCEF) IV 2 g (06/18/20 2349)  . famotidine (PEPCID) IV    . feeding supplement (VITAL HIGH PROTEIN) 35 mL/hr at 06/19/20 0309  . fentaNYL infusion INTRAVENOUS 175 mcg/hr (06/19/20 0101)  . levETIRAcetam 750 mg (06/19/20 0516)  . norepinephrine (LEVOPHED) Adult infusion 5 mcg/min (06/19/20 0451)  . propofol (DIPRIVAN) infusion 55 mcg/kg/min (06/19/20 0653)  . remdesivir 100 mg in NS 100 mL 100 mg (06/18/20 0844)   PRN Meds:.sodium chloride, acetaminophen **OR** acetaminophen, fentaNYL, ibuprofen, metoprolol tartrate, midazolam, midazolam, morphine injection, ondansetron **OR** ondansetron (ZOFRAN) IV, polyethylene glycol, vecuronium Allergies  Allergen Reactions  . Oxycodone-Acetaminophen Other (See Comments)    Causes hyperness   Review of Systems  Unable to perform ROS: Acuity of condition    Physical Exam Vitals and nursing note reviewed.  Constitutional:      Appearance: He is ill-appearing.     Interventions: He is intubated.  Cardiovascular:     Rate and Rhythm: Normal rate.  Pulmonary:     Effort: Accessory muscle usage and respiratory distress present. No tachypnea. He is intubated.  Abdominal:     Palpations: Abdomen is soft.  Neurological:     Comments: Sedated on vent     Vital Signs: BP 132/78   Pulse 92   Temp 98.06 F (36.7 C)   Resp (!) 24   Ht _0  (1.727 m)   Wt 80 kg   SpO2 94%   BMI 26.82 kg/m  Pain Scale: CPOT POSS *See  Group Information*: 1-Acceptable,Awake and alert Pain Score: 0-No pain   SpO2: SpO2: 94 % O2 Device:SpO2: 94 % O2 Flow Rate: .O2 Flow Rate (L/min): 20 L/min  IO: Intake/output summary:   Intake/Output Summary (Last 24 hours) at 06/19/2020 0847 Last data filed at 06/19/2020 0653 Gross per 24 hour  Intake 2818.38 ml  Output 60 ml  Net 2758.38 ml    LBM: Last BM Date: 06/16/20 Baseline Weight: Weight: 81.6 kg Most recent weight: Weight: 80 kg     Palliative Assessment/Data:     Time In/Out: 0539-7673, 1140-1200, 1530-1600 Time Total: 70 min Greater than 50%  of this time was spent counseling and coordinating care related to the above assessment and plan.  Signed by: Vinie Sill, NP Palliative Medicine Team Pager # [phone] (M-F 8a-5p) Team Phone # [phone] (Nights/Weekends)

## 2020-06-20 DIAGNOSIS — J80 Acute respiratory distress syndrome: Secondary | ICD-10-CM | POA: Diagnosis not present

## 2020-06-20 DIAGNOSIS — U071 COVID-19: Secondary | ICD-10-CM | POA: Diagnosis not present

## 2020-06-20 DIAGNOSIS — N390 Urinary tract infection, site not specified: Secondary | ICD-10-CM | POA: Diagnosis not present

## 2020-06-20 DIAGNOSIS — A419 Sepsis, unspecified organism: Secondary | ICD-10-CM | POA: Diagnosis not present

## 2020-06-20 MED ORDER — MORPHINE SULFATE (PF) 2 MG/ML IV SOLN: 2.0000 mg | INTRAVENOUS | Status: DC | PRN

## 2020-06-20 MED ORDER — GLYCOPYRROLATE 0.2 MG/ML IJ SOLN: 0.2000 mg | INTRAMUSCULAR | Status: DC | PRN

## 2020-06-20 MED ORDER — ACETAMINOPHEN 325 MG PO TABS: 650.0000 mg | ORAL_TABLET | Freq: Four times a day (QID) | ORAL | Status: DC | PRN

## 2020-06-20 MED ORDER — POLYVINYL ALCOHOL 1.4 % OP SOLN
1.0000 [drp] | Freq: Four times a day (QID) | OPHTHALMIC | Status: DC | PRN
  Filled 2020-06-20: qty 15

## 2020-06-20 MED ORDER — MORPHINE BOLUS VIA INFUSION
5.0000 mg | INTRAVENOUS | Status: DC | PRN
  Filled 2020-06-20: qty 5

## 2020-06-20 MED ORDER — MIDAZOLAM HCL 2 MG/2ML IJ SOLN: 2.0000 mg | INTRAMUSCULAR | Status: DC | PRN

## 2020-06-20 MED ORDER — ACETAMINOPHEN 650 MG RE SUPP: 650.0000 mg | Freq: Four times a day (QID) | RECTAL | Status: DC | PRN

## 2020-06-20 MED ORDER — GLYCOPYRROLATE 1 MG PO TABS
1.0000 mg | ORAL_TABLET | ORAL | Status: DC | PRN
  Filled 2020-06-20: qty 1

## 2020-06-20 MED ORDER — SODIUM BICARBONATE 8.4 % IV SOLN
INTRAVENOUS | Status: AC
  Filled 2020-06-20: qty 150

## 2020-06-20 MED ORDER — MORPHINE 100MG IN NS 100ML (1MG/ML) PREMIX INFUSION
0.0000 mg/h | INTRAVENOUS | Status: DC
  Administered 2020-06-20: 2 mg/h via INTRAVENOUS
  Filled 2020-06-20: qty 100

## 2020-06-20 MED ORDER — DIPHENHYDRAMINE HCL 50 MG/ML IJ SOLN: 25.0000 mg | INTRAMUSCULAR | Status: DC | PRN

## 2020-06-20 MED ORDER — DEXTROSE 5 % IV SOLN: INTRAVENOUS | Status: DC

## 2020-07-14 NOTE — Progress Notes (Signed)
GOALS OF CARE DISCUSSION  The Clinical status was relayed to family in detail. All family members of HCPOA at bedside  Updated and notified of patients medical condition.  Patient remains unresponsive and will not open eyes to command.   Upon assessment his breath sounds are course crackles with significant secretions to oral pharyngeal region.   Patient is having a weak cough and struggling to remove secretions.   patient with increased WOB and using accessory muscles to breathe Explained to family course of therapy and the modalities     Patient with Progressive multiorgan failure with very low chance of meaningful recovery despite all aggressive and optimal medical therapy. Patient is in the Dying  Process associated with Suffering.  Family understands the situation.  They have consented and agreed to DNR/DNI and would like to proceed with Comfort care measures.  Family are satisfied with Plan of action and management. All questions answered  Additional CC time 32 mins   Nickoles Gregori Patricia Pesa, M.D.  [facility]  Medical Director [place] Director [facility] Cardio-Pulmonary Department

## 2020-07-14 NOTE — Progress Notes (Signed)
[place] encountered pt.'s family in ICU waiting rm.  Pt. intubated but awaiting family decision for extubation and comfort care.  Pt.'s S.O. of 67yrs. initially at bedside (pt. COVID+) but then joined her two dtrs and son-in-law in waiting room.  Family present all related to S.O. rather than pt. but they shared that pt. is essentially family: present at holiday meals, integrated into family activities for many years.  S.O. made decision for extubation and [place] spoke w/family for some time while they awaited time to return to pt.'s rm.  Family plan is to rejoin pt. in rm. post-extubation so e does not die alone.  [place] paged to emergent situation in ICU but remains available as needed.

## 2020-07-14 NOTE — Progress Notes (Signed)
CRITICAL CARE NOTE   1/4 intubated for severe COVID 19 infection and pneumonia 1/5 intubated, severe resp failure patient made DNR 1/6 HCPOA does NOT want comfort measures as previously discussed 1/7 progressive resp failure and renal failure, patient is DNR  CC  follow up respiratory failure  SUBJECTIVE Patient remains critically ill Prognosis is guarded Severe hypoxia   BP (!) 93/48   Pulse 90   Temp (!) 95.18 F (35.1 C)   Resp (!) 21   Ht 5\' 8"  (1.727 m)   Wt 80 kg   SpO2 94%   BMI 26.82 kg/m    I/O last 3 completed shifts: In: 6135.9 [I.V.:4192.9; NG/GT:1289.8; IV Piggyback:653.2] Out: 80 [Urine:30; Emesis/NG output:50] Total I/O In: 725.6 [I.V.:617.6; IV Piggyback:108] Out: 0   SpO2: 94 % O2 Flow Rate (L/min): 20 L/min FiO2 (%): 100 %  Estimated body mass index is 26.82 kg/m as calculated from the following:   Height as of this encounter: 5\' 8"  (1.727 m).   Weight as of this encounter: 80 kg.  SIGNIFICANT EVENTS   REVIEW OF SYSTEMS  PATIENT IS UNABLE TO PROVIDE COMPLETE REVIEW OF SYSTEMS DUE TO SEVERE CRITICAL ILLNESS      COVID-19 DISASTER DECLARATION:   FULL CONTACT PHYSICAL EXAMINATION WAS NOT POSSIBLE DUE TO TREATMENT OF COVID-19  AND CONSERVATION OF PERSONAL PROTECTIVE EQUIPMENT, LIMITED EXAM FINDINGS INCLUDE-   PHYSICAL EXAMINATION:  GENERAL:critically ill appearing, +resp distress NEUROLOGIC: obtunded, GCS<8   Patient assessed or the symptoms described in the history of present illness.  In the context of the Global COVID-19 pandemic, which necessitated consideration that the patient might be at risk for infection with the SARS-CoV-2 virus that causes COVID-19, Institutional protocols and algorithms that pertain to the evaluation of patients at risk for COVID-19 are in a state of rapid change based on information released by regulatory bodies including the CDC and federal and state organizations. These policies and algorithms were  followed during the patient's care while in hospital.    MEDICATIONS: I have reviewed all medications and confirmed regimen as documented   CULTURE RESULTS   Recent Results (from the past 240 hour(s))  Culture, blood (Routine x 2)     Status: None   Collection Time: 05/29/2020 10:13 PM   Specimen: BLOOD  Result Value Ref Range Status   Specimen Description BLOOD BLOOD RIGHT FOREARM  Final   Special Requests   Final    BOTTLES DRAWN AEROBIC AND ANAEROBIC Blood Culture adequate volume   Culture   Final    NO GROWTH 5 DAYS Performed at [facility], [address] [place], [place] [address]    Report Status 06/17/2020 FINAL  Final  Culture, blood (Routine x 2)     Status: Abnormal   Collection Time: 05/31/2020 10:30 PM   Specimen: BLOOD  Result Value Ref Range Status   Specimen Description   Final    BLOOD BLOOD LEFT ARM Performed at [facility], [address] [place], [place] [address]    Special Requests   Final    BOTTLES DRAWN AEROBIC AND ANAEROBIC Blood Culture results may not be optimal due to an inadequate volume of blood received in culture bottles Performed at [facility], [address] [place], [place] [address]    Culture  Setup Time   Final    GRAM POSITIVE COCCI AEROBIC BOTTLE ONLY CRITICAL RESULT CALLED TO, READ BACK BY AND VERIFIED WITH: NATHAN BLEUE @0237  ON 06/14/20 SKL    Culture (  A)  Final    STAPHYLOCOCCUS EPIDERMIDIS THE SIGNIFICANCE OF ISOLATING THIS ORGANISM FROM A SINGLE SET OF BLOOD CULTURES WHEN MULTIPLE SETS ARE DRAWN IS UNCERTAIN. PLEASE NOTIFY THE MICROBIOLOGY DEPARTMENT WITHIN ONE WEEK IF SPECIATION AND SENSITIVITIES ARE REQUIRED. Performed at [facility], [place] [address], [place], [place] [address]    Report Status 06/15/2020 FINAL  Final  Blood Culture ID Panel (Reflexed)     Status: Abnormal   Collection Time: 22-Jun-2020 10:30 PM  Result Value Ref Range Status   Enterococcus faecalis NOT DETECTED NOT  DETECTED Final   Enterococcus Faecium NOT DETECTED NOT DETECTED Final   Listeria monocytogenes NOT DETECTED NOT DETECTED Final   Staphylococcus species DETECTED (A) NOT DETECTED Final    Comment: CRITICAL RESULT CALLED TO, READ BACK BY AND VERIFIED WITH: NATHAN BELUE @0237  ON 06/14/20 SKL    Staphylococcus aureus (BCID) NOT DETECTED NOT DETECTED Final   Staphylococcus epidermidis DETECTED (A) NOT DETECTED Final    Comment: CRITICAL RESULT CALLED TO, READ BACK BY AND VERIFIED WITH: NATHAN BELUE @0237  ON 06/14/20 SKL    Staphylococcus lugdunensis NOT DETECTED NOT DETECTED Final   Streptococcus species NOT DETECTED NOT DETECTED Final   Streptococcus agalactiae NOT DETECTED NOT DETECTED Final   Streptococcus pneumoniae NOT DETECTED NOT DETECTED Final   Streptococcus pyogenes NOT DETECTED NOT DETECTED Final   A.calcoaceticus-baumannii NOT DETECTED NOT DETECTED Final   Bacteroides fragilis NOT DETECTED NOT DETECTED Final   Enterobacterales NOT DETECTED NOT DETECTED Final   Enterobacter cloacae complex NOT DETECTED NOT DETECTED Final   Escherichia coli NOT DETECTED NOT DETECTED Final   Klebsiella aerogenes NOT DETECTED NOT DETECTED Final   Klebsiella oxytoca NOT DETECTED NOT DETECTED Final   Klebsiella pneumoniae NOT DETECTED NOT DETECTED Final   Proteus species NOT DETECTED NOT DETECTED Final   Salmonella species NOT DETECTED NOT DETECTED Final   Serratia marcescens NOT DETECTED NOT DETECTED Final   Haemophilus influenzae NOT DETECTED NOT DETECTED Final   Neisseria meningitidis NOT DETECTED NOT DETECTED Final   Pseudomonas aeruginosa NOT DETECTED NOT DETECTED Final   Stenotrophomonas maltophilia NOT DETECTED NOT DETECTED Final   Candida albicans NOT DETECTED NOT DETECTED Final   Candida auris NOT DETECTED NOT DETECTED Final   Candida glabrata NOT DETECTED NOT DETECTED Final   Candida krusei NOT DETECTED NOT DETECTED Final   Candida parapsilosis NOT DETECTED NOT DETECTED Final   Candida  tropicalis NOT DETECTED NOT DETECTED Final   Cryptococcus neoformans/gattii NOT DETECTED NOT DETECTED Final   Methicillin resistance mecA/C NOT DETECTED NOT DETECTED Final    Comment: Performed at [facility], [address], [place], [place] [address]  Urine culture     Status: Abnormal   Collection Time: 2020-06-22 10:37 PM   Specimen: Urine, Random  Result Value Ref Range Status   Specimen Description   Final    URINE, RANDOM Performed at [facility], [address], [place], [place] [address]    Special Requests   Final    NONE Performed at [facility], [address] [place], [place] [address]    Culture >=100,000 COLONIES/mL STAPHYLOCOCCUS EPIDERMIDIS (A)  Final   Report Status 06/15/2020 FINAL  Final   Organism ID, Bacteria STAPHYLOCOCCUS EPIDERMIDIS (A)  Final      Susceptibility   Staphylococcus epidermidis - MIC*    CIPROFLOXACIN >=8 RESISTANT Resistant     GENTAMICIN <=0.5 SENSITIVE Sensitive     NITROFURANTOIN 32 SENSITIVE Sensitive     OXACILLIN <=  0.25 SENSITIVE Sensitive     TETRACYCLINE <=1 SENSITIVE Sensitive     VANCOMYCIN <=0.5 SENSITIVE Sensitive     TRIMETH/SULFA <=10 SENSITIVE Sensitive     CLINDAMYCIN <=0.25 SENSITIVE Sensitive     RIFAMPIN <=0.5 SENSITIVE Sensitive     Inducible Clindamycin NEGATIVE Sensitive     * >=100,000 COLONIES/mL STAPHYLOCOCCUS EPIDERMIDIS  Resp Panel by RT-PCR (Flu A&B, Covid) Nasopharyngeal Swab     Status: Abnormal   Collection Time: 06/13/20  9:18 AM   Specimen: Nasopharyngeal Swab; Nasopharyngeal(NP) swabs in vial transport medium  Result Value Ref Range Status   SARS Coronavirus 2 by RT PCR POSITIVE (A) NEGATIVE Final    Comment: RESULT CALLED TO, READ BACK BY AND VERIFIED WITH: ANGELA ROBBINS 06/13/20 AT 1103 BY ACR (NOTE) SARS-CoV-2 target nucleic acids are DETECTED.  The SARS-CoV-2 RNA is generally detectable in upper respiratory specimens during the acute phase of infection. Positive  results are indicative of the presence of the identified virus, but do not rule out bacterial infection or co-infection with other pathogens not detected by the test. Clinical correlation with patient history and other diagnostic information is necessary to determine patient infection status. The expected result is Negative.  Fact Sheet for Patients: EntrepreneurPulse.com.au  Fact Sheet for Healthcare Providers: IncredibleEmployment.be  This test is not yet approved or cleared by the [place] FDA and  has been authorized for detection and/or diagnosis of SARS-CoV-2 by FDA under an Emergency Use Authorization (EUA).  This EUA will remain in effect (meaning this test can  be used) for the duration of  the COVID-19 declaration under Section 564(b)(1) of the Act, 21 U.S.C. section 360bbb-3(b)(1), unless the authorization is terminated or revoked sooner.     Influenza A by PCR NEGATIVE NEGATIVE Final   Influenza B by PCR NEGATIVE NEGATIVE Final    Comment: (NOTE) The Xpert Xpress SARS-CoV-2/FLU/RSV plus assay is intended as an aid in the diagnosis of influenza from Nasopharyngeal swab specimens and should not be used as a sole basis for treatment. Nasal washings and aspirates are unacceptable for Xpert Xpress SARS-CoV-2/FLU/RSV testing.  Fact Sheet for Patients: EntrepreneurPulse.com.au  Fact Sheet for Healthcare Providers: IncredibleEmployment.be  This test is not yet approved or cleared by the [place] FDA and has been authorized for detection and/or diagnosis of SARS-CoV-2 by FDA under an Emergency Use Authorization (EUA). This EUA will remain in effect (meaning this test can be used) for the duration of the COVID-19 declaration under Section 564(b)(1) of the Act, 21 U.S.C. section 360bbb-3(b)(1), unless the authorization is terminated or revoked.  Performed at [facility], [address] [place], [place] [address]   CULTURE, BLOOD (ROUTINE X 2) w Reflex to ID Panel     Status: None   Collection Time: 06/14/20  8:37 AM   Specimen: BLOOD  Result Value Ref Range Status   Specimen Description BLOOD LEFT HAND  Final   Special Requests   Final    BOTTLES DRAWN AEROBIC AND ANAEROBIC Blood Culture adequate volume   Culture   Final    NO GROWTH 5 DAYS Performed at [facility], [address] [place], [place] [address]    Report Status 06/19/2020 FINAL  Final  CULTURE, BLOOD (ROUTINE X 2) w Reflex to ID Panel     Status: None   Collection Time: 06/14/20  8:38 AM   Specimen: BLOOD  Result Value Ref Range Status   Specimen Description BLOOD LEFT HAND  Final   Special  Requests   Final    BOTTLES DRAWN AEROBIC AND ANAEROBIC Blood Culture adequate volume   Culture   Final    NO GROWTH 5 DAYS Performed at [facility], [address] [place], [place] [address]    Report Status 06/19/2020 FINAL  Final  MRSA PCR Screening     Status: None   Collection Time: 06/16/20  6:52 PM   Specimen: Nasal Mucosa; Nasopharyngeal  Result Value Ref Range Status   MRSA by PCR NEGATIVE NEGATIVE Final    Comment:        The GeneXpert MRSA Assay (FDA approved for NASAL specimens only), is one component of a comprehensive MRSA colonization surveillance program. It is not intended to diagnose MRSA infection nor to guide or monitor treatment for MRSA infections. Performed at [facility], [address] [place], [place] [address]           IMAGING    DG Abd 1 View  Result Date: 06/19/2020 CLINICAL DATA:  Nasal/orogastric tube placement. EXAM: ABDOMEN - 1 VIEW COMPARISON:  06/18/2020. FINDINGS: Nasal/orogastric tube tip projects into the mid stomach, side hole well within the stomach, further advanced when compared to the prior day's study. Normal bowel gas pattern. No change in the lung base interstitial and airspace opacities. IMPRESSION: 1.  Well-positioned nasal/orogastric tube. Electronically Signed   By: Lajean Manes M.D.   On: 06/19/2020 11:25   US RENAL  Result Date: 06/19/2020 CLINICAL DATA:  Acute renal failure. EXAM: RENAL / URINARY TRACT ULTRASOUND COMPLETE COMPARISON:  None. FINDINGS: Right Kidney: Renal measurements: 11.4 x 6.9 x 7.2 cm = volume: 296 mL. Mild increased parenchymal echogenicity. Mild lobulation of the renal contour. No mass, stone or hydronephrosis. Left Kidney: Renal measurements: 13.3 x 6.6 x 6.8 cm = volume: 312.6 mL. Mild increased parenchymal echogenicity. Moderate hydronephrosis. No mass or stone. Bladder: Decompressed with a Foley catheter. Other: Trace amount of perinephric edema/fluid bilaterally. IMPRESSION: 1. Moderate left hydronephrosis. The etiology of this is unclear. Consider follow-up CT without contrast to assess for a possible distal ureteral stone or other obstruction. 2. Mild bilateral increased renal parenchymal echogenicity consistent with medical renal disease. Normal renal sizes. No renal masses. 3. Bladder not well evaluated, decompressed with a Foley catheter. Electronically Signed   By: Lajean Manes M.D.   On: 06/19/2020 10:33     Nutrition Status: Nutrition Problem: Inadequate oral intake Etiology: inability to eat (pt sedated and ventilated) Signs/Symptoms: NPO status       Indwelling Urinary Catheter continued, requirement due to   Reason to continue Indwelling Urinary Catheter strict Intake/Output monitoring for hemodynamic instability   Central Line/ continued, requirement due to  Reason to continue [place] of central venous pressure or other hemodynamic parameters and poor IV access   Ventilator continued, requirement due to severe respiratory failure   Ventilator Sedation RASS 0 to -2      ASSESSMENT AND PLAN SYNOPSIS  Acute hypoxemic respiratory failure due to COVID-19 pneumonia severe ARDS Mechanical ventilation via ARDS protocol, target  PRVC 6 cc/kg Wean PEEP and FiO2 as able Goal plateau pressure less than 30, driving pressure less than 15 Paralytics if necessary for vent synchrony, gas exchange Deep sedation per PAD protocol, goal RASS -4,  Diuresis as blood pressure and renal function can tolerate, goal CVP 5-8.   diuresis as tolerated based on Kidney function VAP prevention order set Remdesivir  IV STEROIDS     Severe ACUTE Hypoxic and Hypercapnic Respiratory Failure -  continue Full MV support -continue Bronchodilator Therapy -Wean Fio2 and PEEP as tolerated -VAP/VENT bundle implementation   ACUTE SYSTOLIC CARDIAC FAILURE- EF -oxygen as needed -Lasix as tolerated  ACUTE KIDNEY INJURY/Renal Failure -continue Foley Catheter-assess need -Avoid nephrotoxic agents -Follow urine output, BMP -Ensure adequate renal perfusion, optimize oxygenation -Renal dose medications    NEUROLOGY Acute toxic metabolic encephalopathy, need for sedation Goal RASS -2 to -3  SHOCK-SEPSIS -use vasopressors to keep MAP>65   CARDIAC ICU monitoring  ID -continue IV abx as prescibed -follow up cultures  GI GI PROPHYLAXIS as indicated  DIET-->TF's as tolerated Constipation protocol as indicated  ENDO - will use ICU hypoglycemic\Hyperglycemia protocol if indicated     ELECTROLYTES -follow labs as needed -replace as needed -pharmacy consultation and following   DVT/GI PRX ordered and assessed TRANSFUSIONS AS NEEDED MONITOR FSBS I Assessed the need for Labs I Assessed the need for Foley I Assessed the need for Central Venous Line Family Discussion when available I Assessed the need for Mobilization I made an Assessment of medications to be adjusted accordingly Safety Risk assessment completed   CASE DISCUSSED IN MULTIDISCIPLINARY ROUNDS WITH ICU TEAM  Critical Care Time devoted to patient care services described in this note is 65 minutes.   Overall, patient is critically ill, prognosis is guarded.   Patient with Multiorgan failure and at high risk for cardiac arrest and death.   AT this time with progressive Multiorgan failure and with >98 chance of mortality, patients HCPOA has decided on comfort care measures and we are waiting for family to arrive.   Corrin Parker, M.D.  [facility]  Medical Director [place] Director [facility] Cardio-Pulmonary Department

## 2020-07-14 NOTE — Progress Notes (Signed)
Pt. Extubated to room air. 

## 2020-07-14 NOTE — Death Summary Note (Signed)
DEATH SUMMARY   Patient Details  Name: Dylan Sullivan MRN: [id number] DOB: 02-26-1946  Admission/Discharge Information   Admit Date:  07-06-2020  Date of Death:   14-Jul-2020   Time of Death:  3:11PM  Length of Stay: 7  Referring Physician: Casilda Carls, MD   Reason(s) for Hospitalization  COVID 19 pneumonia   Diagnoses  Preliminary cause of death: COVID Oct 24, 2022 pneumonia Secondary Diagnoses (including complications and co-morbidities):  Principal Problem:   Sepsis secondary to UTI [facility]) Active Problems:   Essential (primary) hypertension   BPH without obstruction/lower urinary tract symptoms   OSA on CPAP   Fall at home, initial encounter   Epilepsy [facility])   Complicated UTI (urinary tract infection)   Brief Hospital Course (including significant findings, care, treatment, and services provided and events leading to death)  ADMITTED FOR COVID 19 PNEUMONIA  1/4 intubated for severe COVID 19 infection and pneumonia 1/5 intubated, severe resp failure patient made DNR 1/6 HCPOA does NOT want comfort measures as previously discussed 1/7 progressive resp failure and renal failure, patient is DNR  GOALS OF CARE DISCUSSION  The Clinical status was relayed to family in detail.  Updated and notified of patients medical condition.  patient with increased WOB and using accessory muscles to breathe Explained to family course of therapy and the modalities     Patient with Progressive multiorgan failure with very low chance of meaningful recovery despite all aggressive and optimal medical therapy. Patient is in the Dying  Process associated with Suffering.  Family understands the situation.  They have consented and agreed to DNR/DNI and would like to proceed with Comfort care measures.  Family are satisfied with Plan of action and management. All questions answered  Patient passed away Jul 14, 2020 @3 :11PM     Pertinent Labs and Studies  Significant Diagnostic Studies DG  Neck Soft Tissue  Result Date: 06/17/2020 CLINICAL DATA:  Acute respiratory failure, hypoxia EXAM: NECK SOFT TISSUES - 1+ VIEW COMPARISON:  None. FINDINGS: Endotracheal tube tip is seen within the subglottic airway. Esophageal Doppler probe extends into the upper esophagus. Under distension of the a hypopharynx and overlying tubing limits evaluation. The prevertebral soft tissues are not thickened. Extensive pulmonary infiltrates are seen within the visualized lung apices. IMPRESSION: Endotracheal tube tip seen within the subglottic airway. Electronically Signed   By: Fidela Salisbury MD   On: 06/17/2020 04:45   DG Chest 1 View  Result Date: 06/18/2020 CLINICAL DATA:  Central line placement.  Respiratory failure. EXAM: CHEST  1 VIEW COMPARISON:  06/17/2020 and prior radiographs FINDINGS: A RIGHT IJ central venous catheter is noted with tip overlying the LOWER SVC. An endotracheal tube with tip 7.7 cm above the carina, NG tube with tip overlying the proximal stomach and esophageal probe again noted. Diffuse bilateral airspace opacities are not significantly changed. There is no evidence of pneumothorax. IMPRESSION: 1. RIGHT IJ central venous catheter with tip overlying the LOWER SVC. No evidence of pneumothorax. 2. Unchanged diffuse bilateral airspace opacities. Electronically Signed   By: Margarette Canada M.D.   On: 06/18/2020 19:06   DG Abd 1 View  Result Date: 06/19/2020 CLINICAL DATA:  Nasal/orogastric tube placement. EXAM: ABDOMEN - 1 VIEW COMPARISON:  06/18/2020. FINDINGS: Nasal/orogastric tube tip projects into the mid stomach, side hole well within the stomach, further advanced when compared to the prior day's study. Normal bowel gas pattern. No change in the lung base interstitial and airspace opacities. IMPRESSION: 1. Well-positioned nasal/orogastric tube. Electronically Signed   By: Shanon Brow  Ormond M.D.   On: 06/19/2020 11:25   DG Abd 1 View  Result Date: 06/18/2020 CLINICAL DATA:  NG tube placement.  EXAM: ABDOMEN - 1 VIEW COMPARISON:  None. FINDINGS: An NG tube is noted with tip overlying the proximal stomach and side hole at the esophagogastric junction. IMPRESSION: NG tube with tip overlying the proximal stomach and side hole at the esophagogastric junction. Electronically Signed   By: Margarette Canada M.D.   On: 06/18/2020 19:07   CT Head Wo Contrast  Result Date: 05/22/2020 CLINICAL DATA:  Fall, head injury EXAM: CT HEAD WITHOUT CONTRAST TECHNIQUE: Contiguous axial images were obtained from the base of the skull through the vertex without intravenous contrast. 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 Left temporal occipital and frontotemporal craniotomies have been performed. Multiple surgical clips are seen in the region of the superior sagittal sinus. Cystic encephalomalacia appears to communicate with the left lateral ventricle that this likely results from imperceptibility of the dividing membrane secondary to severe encephalomalacia. These findings are all unchanged from prior examination. No evidence of acute intracranial hemorrhage or infarct. No abnormal mass effect or midline shift. Ventricular size involving the right lateral ventricle, third ventricle, and fourth ventricle is normal. Cerebellum is unremarkable. Vascular: No asymmetric hyperdense vasculature at the skull base. Skull: No acute calvarial fracture. Sinuses/Orbits: The paranasal sinuses are clear. The orbits are unremarkable. Other: The mastoid air cells and middle ear cavities are clear. Small left parietal scalp hematoma is seen at the vertex. IMPRESSION: Small left parietal scalp hematoma. No evidence of acute intracranial hemorrhage or infarct. Stable extensive encephalomalacia involving the left cerebral hemisphere possibly the sequela of remote MCA territory infarction.  Postsurgical changes again noted with left calvarium. Electronically Signed   By: Fidela Salisbury MD   On: 06/01/2020 23:35   CT ANGIO CHEST PE W OR WO CONTRAST  Result Date: 06/15/2020 CLINICAL DATA:  COVID pneumonia, elevated D-dimer EXAM: CT ANGIOGRAPHY CHEST WITH CONTRAST TECHNIQUE: Multidetector CT imaging of the chest was performed using the standard protocol during bolus administration of intravenous contrast. Multiplanar CT image reconstructions and MIPs were obtained to evaluate the vascular anatomy. CONTRAST:  47mL OMNIPAQUE IOHEXOL 350 MG/ML SOLN COMPARISON:  None. FINDINGS: Cardiovascular: There is adequate opacification of the pulmonary arterial tree. No intraluminal filling defect identified to suggest acute pulmonary embolism. The central pulmonary arteries are enlarged in keeping with changes of pulmonary arterial hypertension. No significant coronary artery calcification. Global cardiac size within normal limits. Small fluid noted within the superior pericardial recess. Mild atherosclerotic calcification noted within the thoracic aorta. No aortic aneurysm. Mediastinum/Nodes: There is shotty right hilar, subcarinal, and right paratracheal adenopathy, likely reactive in nature. Esophagus is unremarkable. Thyroid unremarkable. Small hiatal hernia. Lungs/Pleura: There is extensive, asymmetric, diffuse ground-glass pulmonary infiltrate and pulmonary consolidation, likely infectious or inflammatory in nature. No pneumothorax. Trace left and small right pleural effusions are present with associated right basilar compressive atelectasis. Central airways are widely patent. Upper Abdomen: Mild bilateral perinephric stranding is partially visualized though the kidneys are largely excluded from view. No acute abnormality identified. Musculoskeletal: No acute bone abnormality. Review of the MIP images confirms the above findings. IMPRESSION: No pulmonary embolism. Extensive multifocal ground-glass pulmonary  infiltrate and pulmonary consolidation with associated small bilateral pleural effusions. The findings are in keeping with atypical infection in the acute setting and, while not pathognomonic of COVID-19 pneumonia, can  be seen the setting of acute COVID infection. Aortic Atherosclerosis (ICD10-I70.0). Electronically Signed   By: Fidela Salisbury MD   On: 06/15/2020 00:25   US RENAL  Result Date: 06/19/2020 CLINICAL DATA:  Acute renal failure. EXAM: RENAL / URINARY TRACT ULTRASOUND COMPLETE COMPARISON:  None. FINDINGS: Right Kidney: Renal measurements: 11.4 x 6.9 x 7.2 cm = volume: 296 mL. Mild increased parenchymal echogenicity. Mild lobulation of the renal contour. No mass, stone or hydronephrosis. Left Kidney: Renal measurements: 13.3 x 6.6 x 6.8 cm = volume: 312.6 mL. Mild increased parenchymal echogenicity. Moderate hydronephrosis. No mass or stone. Bladder: Decompressed with a Foley catheter. Other: Trace amount of perinephric edema/fluid bilaterally. IMPRESSION: 1. Moderate left hydronephrosis. The etiology of this is unclear. Consider follow-up CT without contrast to assess for a possible distal ureteral stone or other obstruction. 2. Mild bilateral increased renal parenchymal echogenicity consistent with medical renal disease. Normal renal sizes. No renal masses. 3. Bladder not well evaluated, decompressed with a Foley catheter. Electronically Signed   By: Lajean Manes M.D.   On: 06/19/2020 10:33   DG Chest Port 1 View  Result Date: 06/19/2020 CLINICAL DATA:  Acute respiratory failure EXAM: PORTABLE CHEST 1 VIEW COMPARISON:  Yesterday FINDINGS: Endotracheal tube with tip at the clavicular heads. The enteric tube tip and side-port reaches the stomach. Right IJ line with tip at the SVC. Hazy bilateral chest opacification with greater density on the left. Normal heart size and mediastinal contours. No visible effusion or pneumothorax. IMPRESSION: 1. Stable hardware positioning. 2. Unchanged extensive  airspace disease. Electronically Signed   By: Monte Fantasia M.D.   On: 06/19/2020 04:20   DG Chest Port 1 View  Result Date: 06/17/2020 CLINICAL DATA:  Intubation EXAM: PORTABLE CHEST 1 VIEW COMPARISON:  4:17 a.m. FINDINGS: The endotracheal tube is been advanced and its tip is now seen at the level of the clavicular head 7.6 cm above the carina. Esophageal Doppler probe overlies the mid esophagus, unchanged. Pulmonary insufflation remain stable. Extensive bilateral pulmonary infiltrates are again identified. No pneumothorax or pleural effusion. Cardiac size within normal limits. IMPRESSION: Interval advancement of the endotracheal tube, now seen at the level of the clavicular heads. Stable extensive bilateral pulmonary infiltrates, likely infectious Electronically Signed   By: Fidela Salisbury MD   On: 06/17/2020 05:42   DG Chest Port 1 View  Result Date: 06/17/2020 CLINICAL DATA:  Acute respiratory failure, hypoxia EXAM: PORTABLE CHEST 1 VIEW COMPARISON:  06/16/2020 FINDINGS: The endotracheal tube has withdrawn and its tip is now seen within the subglottic airway roughly 12 cm above the carina. Esophageal Doppler probe overlies the expected mid esophagus. Pulmonary insufflation remains stable. Superimposed multifocal pulmonary infiltrates, more focal within the left mid lung zone appears he slightly improved within the right upper lobe and progressive within the mid lung zones bilaterally. No pneumothorax or pleural effusion. Cardiac size within normal limits. IMPRESSION: Endotracheal tube has been withdrawn into the subglottic airway, roughly 12 cm above the carina. Advancement is recommended for optimal positioning. Waxing and waning pulmonary infiltrates, likely infectious. These results will be called to the ordering clinician or representative by the Radiologist Assistant, and communication documented in the PACS or [employer]. Electronically Signed   By: Fidela Salisbury MD   On: 06/17/2020 04:42    DG Chest Port 1 View  Result Date: 06/16/2020 CLINICAL DATA:  Endotracheal tube placement EXAM: PORTABLE CHEST 1 VIEW COMPARISON:  06/11/2020 FINDINGS: Endotracheal tube is approximately 7 cm above the carina. Left  greater than right opacities are present. No significant pleural effusion. No pneumothorax. Stable cardiomediastinal contours. IMPRESSION: Endotracheal tube 7 cm above the carina. Bilateral pulmonary opacities likely reflecting multifocal pneumonia. Electronically Signed   By: Macy Mis M.D.   On: 06/16/2020 19:47   DG Chest Port 1 View  Result Date: 06-16-2020 CLINICAL DATA:  Multiple falls, weakness EXAM: PORTABLE CHEST 1 VIEW COMPARISON:  None. FINDINGS: Single frontal view of the chest demonstrates an unremarkable cardiac silhouette. Interstitial prominence throughout the lungs is age indeterminate, favor multifocal areas of scarring. There is no airspace disease, effusion, or pneumothorax. There are no acute displaced fractures. IMPRESSION: 1. Diffuse interstitial prominence most compatible with scarring. No acute airspace disease. 2. No acute fracture. Electronically Signed   By: Randa Ngo M.D.   On: 2020-06-16 22:32   [place] EKG SITE RITE  Result Date: 06/17/2020 If Site Rite image not attached, placement could not be confirmed due to current cardiac rhythm.   Microbiology Recent Results (from the past 240 hour(s))  Culture, blood (Routine x 2)     Status: None   Collection Time: 16-Jun-2020 10:13 PM   Specimen: BLOOD  Result Value Ref Range Status   Specimen Description BLOOD BLOOD RIGHT FOREARM  Final   Special Requests   Final    BOTTLES DRAWN AEROBIC AND ANAEROBIC Blood Culture adequate volume   Culture   Final    NO GROWTH 5 DAYS Performed at [facility], [address] [place], [place] [address]    Report Status 06/17/2020 FINAL  Final  Culture, blood (Routine x 2)     Status: Abnormal   Collection Time: 06/16/20 10:30 PM   Specimen: BLOOD   Result Value Ref Range Status   Specimen Description   Final    BLOOD BLOOD LEFT ARM Performed at [facility], [address], [place], [place] [address]    Special Requests   Final    BOTTLES DRAWN AEROBIC AND ANAEROBIC Blood Culture results may not be optimal due to an inadequate volume of blood received in culture bottles Performed at [facility], [address] [place], [place] [address]    Culture  Setup Time   Final    GRAM POSITIVE COCCI AEROBIC BOTTLE ONLY CRITICAL RESULT CALLED TO, READ BACK BY AND VERIFIED WITH: NATHAN BLEUE @0237  ON 06/14/20 SKL    Culture (A)  Final    STAPHYLOCOCCUS EPIDERMIDIS THE SIGNIFICANCE OF ISOLATING THIS ORGANISM FROM A SINGLE SET OF BLOOD CULTURES WHEN MULTIPLE SETS ARE DRAWN IS UNCERTAIN. PLEASE NOTIFY THE MICROBIOLOGY DEPARTMENT WITHIN ONE WEEK IF SPECIATION AND SENSITIVITIES ARE REQUIRED. Performed at [facility], [place] [address] [place], [place] [address]    Report Status 06/15/2020 FINAL  Final  Blood Culture ID Panel (Reflexed)     Status: Abnormal   Collection Time: 06/16/20 10:30 PM  Result Value Ref Range Status   Enterococcus faecalis NOT DETECTED NOT DETECTED Final   Enterococcus Faecium NOT DETECTED NOT DETECTED Final   Listeria monocytogenes NOT DETECTED NOT DETECTED Final   Staphylococcus species DETECTED (A) NOT DETECTED Final    Comment: CRITICAL RESULT CALLED TO, READ BACK BY AND VERIFIED WITH: NATHAN BELUE @0237  ON 06/14/20 SKL    Staphylococcus aureus (BCID) NOT DETECTED NOT DETECTED Final   Staphylococcus epidermidis DETECTED (A) NOT DETECTED Final    Comment: CRITICAL RESULT CALLED TO, READ BACK BY AND VERIFIED WITH: NATHAN BELUE @0237  ON 06/14/20 SKL    Staphylococcus lugdunensis NOT DETECTED  NOT DETECTED Final   Streptococcus species NOT DETECTED NOT DETECTED Final   Streptococcus agalactiae NOT DETECTED NOT DETECTED Final   Streptococcus pneumoniae NOT DETECTED NOT DETECTED Final    Streptococcus pyogenes NOT DETECTED NOT DETECTED Final   A.calcoaceticus-baumannii NOT DETECTED NOT DETECTED Final   Bacteroides fragilis NOT DETECTED NOT DETECTED Final   Enterobacterales NOT DETECTED NOT DETECTED Final   Enterobacter cloacae complex NOT DETECTED NOT DETECTED Final   Escherichia coli NOT DETECTED NOT DETECTED Final   Klebsiella aerogenes NOT DETECTED NOT DETECTED Final   Klebsiella oxytoca NOT DETECTED NOT DETECTED Final   Klebsiella pneumoniae NOT DETECTED NOT DETECTED Final   Proteus species NOT DETECTED NOT DETECTED Final   Salmonella species NOT DETECTED NOT DETECTED Final   Serratia marcescens NOT DETECTED NOT DETECTED Final   Haemophilus influenzae NOT DETECTED NOT DETECTED Final   Neisseria meningitidis NOT DETECTED NOT DETECTED Final   Pseudomonas aeruginosa NOT DETECTED NOT DETECTED Final   Stenotrophomonas maltophilia NOT DETECTED NOT DETECTED Final   Candida albicans NOT DETECTED NOT DETECTED Final   Candida auris NOT DETECTED NOT DETECTED Final   Candida glabrata NOT DETECTED NOT DETECTED Final   Candida krusei NOT DETECTED NOT DETECTED Final   Candida parapsilosis NOT DETECTED NOT DETECTED Final   Candida tropicalis NOT DETECTED NOT DETECTED Final   Cryptococcus neoformans/gattii NOT DETECTED NOT DETECTED Final   Methicillin resistance mecA/C NOT DETECTED NOT DETECTED Final    Comment: Performed at [facility], [address] [place], [place] [address]  Urine culture     Status: Abnormal   Collection Time: 06/01/2020 10:37 PM   Specimen: Urine, Random  Result Value Ref Range Status   Specimen Description   Final    URINE, RANDOM Performed at [facility], [address] [place], [place] [address]    Special Requests   Final    NONE Performed at [facility], [address] [place], [place] [address]    Culture >=100,000 COLONIES/mL STAPHYLOCOCCUS EPIDERMIDIS (A)  Final   Report Status 06/15/2020 FINAL  Final    Organism ID, Bacteria STAPHYLOCOCCUS EPIDERMIDIS (A)  Final      Susceptibility   Staphylococcus epidermidis - MIC*    CIPROFLOXACIN >=8 RESISTANT Resistant     GENTAMICIN <=0.5 SENSITIVE Sensitive     NITROFURANTOIN 32 SENSITIVE Sensitive     OXACILLIN <=0.25 SENSITIVE Sensitive     TETRACYCLINE <=1 SENSITIVE Sensitive     VANCOMYCIN <=0.5 SENSITIVE Sensitive     TRIMETH/SULFA <=10 SENSITIVE Sensitive     CLINDAMYCIN <=0.25 SENSITIVE Sensitive     RIFAMPIN <=0.5 SENSITIVE Sensitive     Inducible Clindamycin NEGATIVE Sensitive     * >=100,000 COLONIES/mL STAPHYLOCOCCUS EPIDERMIDIS  Resp Panel by RT-PCR (Flu A&B, Covid) Nasopharyngeal Swab     Status: Abnormal   Collection Time: 06/13/20  9:18 AM   Specimen: Nasopharyngeal Swab; Nasopharyngeal(NP) swabs in vial transport medium  Result Value Ref Range Status   SARS Coronavirus 2 by RT PCR POSITIVE (A) NEGATIVE Final    Comment: RESULT CALLED TO, READ BACK BY AND VERIFIED WITH: ANGELA ROBBINS 06/13/20 AT 1103 BY ACR (NOTE) SARS-CoV-2 target nucleic acids are DETECTED.  The SARS-CoV-2 RNA is generally detectable in upper respiratory specimens during the acute phase of infection. Positive results are indicative of the presence of the identified virus, but do not rule out bacterial infection or co-infection with other pathogens not detected by the test. Clinical correlation with patient history and other  diagnostic information is necessary to determine patient infection status. The expected result is Negative.  Fact Sheet for Patients: EntrepreneurPulse.com.au  Fact Sheet for Healthcare Providers: IncredibleEmployment.be  This test is not yet approved or cleared by the [place] FDA and  has been authorized for detection and/or diagnosis of SARS-CoV-2 by FDA under an Emergency Use Authorization (EUA).  This EUA will remain in effect (meaning this test can  be used) for the duration of  the  COVID-19 declaration under Section 564(b)(1) of the Act, 21 U.S.C. section 360bbb-3(b)(1), unless the authorization is terminated or revoked sooner.     Influenza A by PCR NEGATIVE NEGATIVE Final   Influenza B by PCR NEGATIVE NEGATIVE Final    Comment: (NOTE) The Xpert Xpress SARS-CoV-2/FLU/RSV plus assay is intended as an aid in the diagnosis of influenza from Nasopharyngeal swab specimens and should not be used as a sole basis for treatment. Nasal washings and aspirates are unacceptable for Xpert Xpress SARS-CoV-2/FLU/RSV testing.  Fact Sheet for Patients: EntrepreneurPulse.com.au  Fact Sheet for Healthcare Providers: IncredibleEmployment.be  This test is not yet approved or cleared by the [place] FDA and has been authorized for detection and/or diagnosis of SARS-CoV-2 by FDA under an Emergency Use Authorization (EUA). This EUA will remain in effect (meaning this test can be used) for the duration of the COVID-19 declaration under Section 564(b)(1) of the Act, 21 U.S.C. section 360bbb-3(b)(1), unless the authorization is terminated or revoked.  Performed at [facility], [address] [place], [place] [address]   CULTURE, BLOOD (ROUTINE X 2) w Reflex to ID Panel     Status: None   Collection Time: 06/14/20  8:37 AM   Specimen: BLOOD  Result Value Ref Range Status   Specimen Description BLOOD LEFT HAND  Final   Special Requests   Final    BOTTLES DRAWN AEROBIC AND ANAEROBIC Blood Culture adequate volume   Culture   Final    NO GROWTH 5 DAYS Performed at [facility], [address] [place], [place] [address]    Report Status 06/19/2020 FINAL  Final  CULTURE, BLOOD (ROUTINE X 2) w Reflex to ID Panel     Status: None   Collection Time: 06/14/20  8:38 AM   Specimen: BLOOD  Result Value Ref Range Status   Specimen Description BLOOD LEFT HAND  Final   Special Requests   Final    BOTTLES DRAWN AEROBIC AND  ANAEROBIC Blood Culture adequate volume   Culture   Final    NO GROWTH 5 DAYS Performed at [facility], [address] [place], [place] [address]    Report Status 06/19/2020 FINAL  Final  MRSA PCR Screening     Status: None   Collection Time: 06/16/20  6:52 PM   Specimen: Nasal Mucosa; Nasopharyngeal  Result Value Ref Range Status   MRSA by PCR NEGATIVE NEGATIVE Final    Comment:        The GeneXpert MRSA Assay (FDA approved for NASAL specimens only), is one component of a comprehensive MRSA colonization surveillance program. It is not intended to diagnose MRSA infection nor to guide or monitor treatment for MRSA infections. Performed at [facility], [address] [place],  [address]     Lab Basic Metabolic Panel: Recent Labs  Lab 06/14/20 0432 06/15/20 0400 06/16/20 0515 06/18/20 0258 06/19/20 0530  NA 133* 133* 132* 134* 133*  K 3.5 3.7 3.4* 4.3 4.5  CL 100 101 100 99 100  CO2 20* 22 20* 22 16*  GLUCOSE 103* 112* 107* 122* 122*  BUN 13 16 18  34* 46*  CREATININE 0.89 0.85 0.90 3.09* 4.30*  CALCIUM 8.1* 7.9* 8.3* 8.2* 7.8*  PHOS  --   --   --   --  6.9*   Liver Function Tests: Recent Labs  Lab 06/14/20 0432 06/15/20 0400 06/16/20 0515 06/18/20 0258 06/19/20 0530  AST 193* 195* 181* 123*  --   ALT 75* 83* 75* 10  --   ALKPHOS 36* 40 35* 38  --   BILITOT 0.8 0.8 0.8 0.6  --   PROT 5.9* 5.7* 5.6* 5.5*  --   ALBUMIN 2.3* 2.4* 2.1* 1.9* 1.7*   No results for input(s): LIPASE, AMYLASE in the last 168 hours. No results for input(s): AMMONIA in the last 168 hours. CBC: Recent Labs  Lab 06/14/20 0432 06/15/20 0400 06/16/20 0515 06/18/20 0258 06/19/20 0530  WBC 7.5 7.2 9.4 13.1* 20.4*  NEUTROABS  --   --   --   --  18.6*  HGB 13.1 13.0 13.0 12.5* 11.2*  HCT 36.0* 36.3* 36.2* 36.4* 33.0*  MCV 92.8 93.1 92.6 96.6 96.5  PLT 254 286 326 379 446*   Cardiac Enzymes: No results for input(s): CKTOTAL, CKMB, CKMBINDEX,  TROPONINI in the last 168 hours. Sepsis Labs: Recent Labs  Lab 06/15/20 0400 06/16/20 0515 06/18/20 0258 06/19/20 0530  WBC 7.2 9.4 13.1* 20.4*      Fredi Hurtado 07/14/2020, 3:22 PM

## 2020-07-14 DEATH — deceased

## 2020-07-29 LAB — BLOOD GAS, ARTERIAL
Acid-base deficit: 4 mmol/L — ABNORMAL HIGH (ref 0.0–2.0)
Bicarbonate: 25.2 mmol/L (ref 20.0–28.0)
FIO2: 1
MECHVT: 450 mL
O2 Saturation: 98.4 %
PEEP: 10 cmH2O
Patient temperature: 37
RATE: 20 resp/min
pCO2 arterial: 63 mmHg — ABNORMAL HIGH (ref 32.0–48.0)
pH, Arterial: 7.21 — ABNORMAL LOW (ref 7.350–7.450)
pO2, Arterial: 133 mmHg — ABNORMAL HIGH (ref 83.0–108.0)

## 2020-11-23 ENCOUNTER — Other Ambulatory Visit: Payer: Self-pay

## 2020-11-26 ENCOUNTER — Ambulatory Visit: Payer: Self-pay | Admitting: Urology

## 2022-04-26 ENCOUNTER — Encounter (INDEPENDENT_AMBULATORY_CARE_PROVIDER_SITE_OTHER): Payer: Medicare Other

## 2022-04-26 ENCOUNTER — Ambulatory Visit (INDEPENDENT_AMBULATORY_CARE_PROVIDER_SITE_OTHER): Payer: Medicare Other | Admitting: Vascular Surgery
